# Patient Record
Sex: Female | Born: 1985 | Race: Black or African American | Hispanic: No | Marital: Single | Smoking: Former smoker
Health system: Southern US, Community
[De-identification: ages and names within clinical notes are randomized; demographics above are authoritative.]

## PROBLEM LIST (undated history)

## (undated) ENCOUNTER — Emergency Department (HOSPITAL_COMMUNITY): Admission: EM | Discharge status: Absent without leave | Payer: No Typology Code available for payment source

## (undated) DIAGNOSIS — I10 Essential (primary) hypertension: Secondary | ICD-10-CM

## (undated) DIAGNOSIS — J45909 Unspecified asthma, uncomplicated: Secondary | ICD-10-CM

---
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
## 2005-02-03 ENCOUNTER — Inpatient Hospital Stay (HOSPITAL_COMMUNITY): Admission: AD | Admit: 2005-02-03 | Discharge: 2005-02-03 | Payer: Self-pay | Admitting: Obstetrics

## 2005-02-07 ENCOUNTER — Observation Stay (HOSPITAL_COMMUNITY): Admission: AD | Admit: 2005-02-07 | Discharge: 2005-02-07 | Payer: Self-pay | Admitting: Obstetrics & Gynecology

## 2005-02-13 ENCOUNTER — Inpatient Hospital Stay (HOSPITAL_COMMUNITY): Admission: AD | Admit: 2005-02-13 | Discharge: 2005-02-13 | Payer: Self-pay | Admitting: Obstetrics & Gynecology

## 2005-02-17 ENCOUNTER — Inpatient Hospital Stay (HOSPITAL_COMMUNITY): Admission: AD | Admit: 2005-02-17 | Discharge: 2005-02-19 | Payer: Self-pay | Admitting: Obstetrics

## 2005-03-29 ENCOUNTER — Ambulatory Visit: Payer: Self-pay | Admitting: Family Medicine

## 2005-06-12 ENCOUNTER — Encounter (INDEPENDENT_AMBULATORY_CARE_PROVIDER_SITE_OTHER): Payer: Self-pay | Admitting: *Deleted

## 2005-06-25 ENCOUNTER — Inpatient Hospital Stay (HOSPITAL_COMMUNITY): Admission: EM | Admit: 2005-06-25 | Discharge: 2005-06-29 | Payer: Self-pay | Admitting: Emergency Medicine

## 2005-08-30 ENCOUNTER — Ambulatory Visit: Payer: Self-pay | Admitting: Family Medicine

## 2005-10-12 ENCOUNTER — Ambulatory Visit: Payer: Self-pay | Admitting: Family Medicine

## 2005-11-15 ENCOUNTER — Ambulatory Visit: Payer: Self-pay | Admitting: Family Medicine

## 2005-12-12 ENCOUNTER — Ambulatory Visit: Payer: Self-pay | Admitting: Family Medicine

## 2006-02-06 ENCOUNTER — Ambulatory Visit: Payer: Self-pay | Admitting: Family Medicine

## 2006-04-19 ENCOUNTER — Ambulatory Visit: Payer: Self-pay | Admitting: Sports Medicine

## 2006-08-09 DIAGNOSIS — J45909 Unspecified asthma, uncomplicated: Secondary | ICD-10-CM | POA: Insufficient documentation

## 2006-08-09 DIAGNOSIS — J309 Allergic rhinitis, unspecified: Secondary | ICD-10-CM | POA: Insufficient documentation

## 2006-08-10 ENCOUNTER — Encounter (INDEPENDENT_AMBULATORY_CARE_PROVIDER_SITE_OTHER): Payer: Self-pay | Admitting: *Deleted

## 2006-08-26 ENCOUNTER — Emergency Department (HOSPITAL_COMMUNITY): Admission: EM | Admit: 2006-08-26 | Discharge: 2006-08-26 | Payer: Self-pay | Admitting: Family Medicine

## 2006-10-08 ENCOUNTER — Encounter (INDEPENDENT_AMBULATORY_CARE_PROVIDER_SITE_OTHER): Payer: Self-pay | Admitting: Family Medicine

## 2006-10-10 ENCOUNTER — Emergency Department (HOSPITAL_COMMUNITY): Admission: EM | Admit: 2006-10-10 | Discharge: 2006-10-10 | Payer: Self-pay | Admitting: Emergency Medicine

## 2006-10-16 ENCOUNTER — Telehealth: Payer: Self-pay | Admitting: *Deleted

## 2006-10-17 ENCOUNTER — Telehealth: Payer: Self-pay | Admitting: *Deleted

## 2006-11-13 ENCOUNTER — Telehealth (INDEPENDENT_AMBULATORY_CARE_PROVIDER_SITE_OTHER): Payer: Self-pay | Admitting: Family Medicine

## 2006-12-11 ENCOUNTER — Inpatient Hospital Stay (HOSPITAL_COMMUNITY): Admission: AD | Admit: 2006-12-11 | Discharge: 2006-12-11 | Payer: Self-pay | Admitting: Family Medicine

## 2007-02-08 ENCOUNTER — Encounter: Payer: Self-pay | Admitting: *Deleted

## 2007-02-18 ENCOUNTER — Ambulatory Visit: Payer: Self-pay | Admitting: Sports Medicine

## 2007-02-18 DIAGNOSIS — L0293 Carbuncle, unspecified: Secondary | ICD-10-CM

## 2007-02-18 LAB — CONVERTED CEMR LAB: Beta hcg, urine, semiquantitative: NEGATIVE

## 2007-04-09 ENCOUNTER — Ambulatory Visit: Payer: Self-pay | Admitting: Family Medicine

## 2007-04-09 ENCOUNTER — Other Ambulatory Visit: Admission: RE | Admit: 2007-04-09 | Discharge: 2007-04-09 | Payer: Self-pay | Admitting: Family Medicine

## 2007-04-09 LAB — CONVERTED CEMR LAB: Pap Smear: NORMAL

## 2007-04-11 ENCOUNTER — Encounter (INDEPENDENT_AMBULATORY_CARE_PROVIDER_SITE_OTHER): Payer: Self-pay | Admitting: Family Medicine

## 2007-04-21 ENCOUNTER — Emergency Department (HOSPITAL_COMMUNITY): Admission: EM | Admit: 2007-04-21 | Discharge: 2007-04-21 | Payer: Self-pay | Admitting: Emergency Medicine

## 2007-04-24 ENCOUNTER — Telehealth: Payer: Self-pay | Admitting: Family Medicine

## 2007-05-13 ENCOUNTER — Emergency Department (HOSPITAL_COMMUNITY): Admission: EM | Admit: 2007-05-13 | Discharge: 2007-05-13 | Payer: Self-pay | Admitting: Emergency Medicine

## 2007-05-14 ENCOUNTER — Ambulatory Visit: Payer: Self-pay | Admitting: Family Medicine

## 2007-05-14 DIAGNOSIS — I10 Essential (primary) hypertension: Secondary | ICD-10-CM

## 2007-05-14 LAB — CONVERTED CEMR LAB
Blood in Urine, dipstick: NEGATIVE
Glucose, Urine, Semiquant: NEGATIVE
Ketones, urine, test strip: NEGATIVE
Protein, U semiquant: 30
Specific Gravity, Urine: >=1.03
pH: 6

## 2007-06-03 ENCOUNTER — Telehealth: Payer: Self-pay | Admitting: *Deleted

## 2007-06-27 ENCOUNTER — Telehealth: Payer: Self-pay | Admitting: *Deleted

## 2007-07-01 ENCOUNTER — Emergency Department (HOSPITAL_COMMUNITY): Admission: EM | Admit: 2007-07-01 | Discharge: 2007-07-01 | Payer: Self-pay | Admitting: Family Medicine

## 2007-07-01 ENCOUNTER — Telehealth (INDEPENDENT_AMBULATORY_CARE_PROVIDER_SITE_OTHER): Payer: Self-pay | Admitting: *Deleted

## 2007-07-23 ENCOUNTER — Telehealth (INDEPENDENT_AMBULATORY_CARE_PROVIDER_SITE_OTHER): Payer: Self-pay | Admitting: Family Medicine

## 2007-07-23 ENCOUNTER — Emergency Department (HOSPITAL_COMMUNITY): Admission: EM | Admit: 2007-07-23 | Discharge: 2007-07-23 | Payer: Self-pay | Admitting: Family Medicine

## 2007-07-31 ENCOUNTER — Telehealth: Payer: Self-pay | Admitting: *Deleted

## 2007-09-18 ENCOUNTER — Encounter: Payer: Self-pay | Admitting: *Deleted

## 2007-09-18 ENCOUNTER — Encounter (INDEPENDENT_AMBULATORY_CARE_PROVIDER_SITE_OTHER): Payer: Self-pay | Admitting: Family Medicine

## 2007-09-18 ENCOUNTER — Ambulatory Visit: Payer: Self-pay | Admitting: Sports Medicine

## 2007-09-18 LAB — CONVERTED CEMR LAB: GC Probe Amp, Urine: NEGATIVE

## 2007-09-19 ENCOUNTER — Encounter (INDEPENDENT_AMBULATORY_CARE_PROVIDER_SITE_OTHER): Payer: Self-pay | Admitting: Family Medicine

## 2007-10-01 ENCOUNTER — Telehealth: Payer: Self-pay | Admitting: *Deleted

## 2007-10-02 ENCOUNTER — Ambulatory Visit: Payer: Self-pay | Admitting: Family Medicine

## 2007-11-12 ENCOUNTER — Telehealth: Payer: Self-pay | Admitting: *Deleted

## 2007-12-01 ENCOUNTER — Emergency Department (HOSPITAL_COMMUNITY): Admission: EM | Admit: 2007-12-01 | Discharge: 2007-12-02 | Payer: Self-pay | Admitting: Emergency Medicine

## 2007-12-18 ENCOUNTER — Telehealth: Payer: Self-pay | Admitting: *Deleted

## 2007-12-23 ENCOUNTER — Encounter: Payer: Self-pay | Admitting: Family Medicine

## 2008-01-02 ENCOUNTER — Encounter: Payer: Self-pay | Admitting: Family Medicine

## 2008-02-07 ENCOUNTER — Encounter: Payer: Self-pay | Admitting: Family Medicine

## 2008-02-07 ENCOUNTER — Telehealth: Payer: Self-pay | Admitting: *Deleted

## 2008-02-07 ENCOUNTER — Ambulatory Visit: Payer: Self-pay | Admitting: Family Medicine

## 2008-02-07 LAB — CONVERTED CEMR LAB
Beta hcg, urine, semiquantitative: NEGATIVE
Chlamydia, DNA Probe: NEGATIVE
Whiff Test: NEGATIVE

## 2008-03-14 ENCOUNTER — Emergency Department (HOSPITAL_COMMUNITY): Admission: EM | Admit: 2008-03-14 | Discharge: 2008-03-14 | Payer: Self-pay | Admitting: Emergency Medicine

## 2008-03-19 ENCOUNTER — Telehealth: Payer: Self-pay | Admitting: *Deleted

## 2008-04-02 ENCOUNTER — Ambulatory Visit: Payer: Self-pay | Admitting: Family Medicine

## 2008-04-02 ENCOUNTER — Encounter (INDEPENDENT_AMBULATORY_CARE_PROVIDER_SITE_OTHER): Payer: Self-pay | Admitting: Family Medicine

## 2008-04-02 LAB — CONVERTED CEMR LAB: Chlamydia, DNA Probe: NEGATIVE

## 2008-04-03 ENCOUNTER — Encounter (INDEPENDENT_AMBULATORY_CARE_PROVIDER_SITE_OTHER): Payer: Self-pay | Admitting: Family Medicine

## 2008-06-18 ENCOUNTER — Telehealth: Payer: Self-pay | Admitting: *Deleted

## 2008-06-19 ENCOUNTER — Ambulatory Visit: Payer: Self-pay | Admitting: Family Medicine

## 2008-07-17 ENCOUNTER — Encounter: Payer: Self-pay | Admitting: Family Medicine

## 2008-07-17 ENCOUNTER — Ambulatory Visit: Payer: Self-pay | Admitting: Family Medicine

## 2008-07-17 ENCOUNTER — Telehealth: Payer: Self-pay | Admitting: Family Medicine

## 2008-07-17 ENCOUNTER — Other Ambulatory Visit: Admission: RE | Admit: 2008-07-17 | Discharge: 2008-07-17 | Payer: Self-pay | Admitting: Family Medicine

## 2008-07-17 LAB — CONVERTED CEMR LAB
Chlamydia, DNA Probe: NEGATIVE
GC Probe Amp, Genital: NEGATIVE

## 2008-07-21 ENCOUNTER — Encounter: Payer: Self-pay | Admitting: Family Medicine

## 2008-07-28 ENCOUNTER — Telehealth: Payer: Self-pay | Admitting: Family Medicine

## 2008-08-06 ENCOUNTER — Emergency Department (HOSPITAL_COMMUNITY): Admission: EM | Admit: 2008-08-06 | Discharge: 2008-08-06 | Payer: Self-pay | Admitting: Family Medicine

## 2008-08-06 ENCOUNTER — Telehealth: Payer: Self-pay | Admitting: Family Medicine

## 2008-09-07 ENCOUNTER — Encounter (INDEPENDENT_AMBULATORY_CARE_PROVIDER_SITE_OTHER): Payer: Self-pay | Admitting: *Deleted

## 2008-09-15 ENCOUNTER — Telehealth: Payer: Self-pay | Admitting: Family Medicine

## 2008-09-16 ENCOUNTER — Encounter: Payer: Self-pay | Admitting: Family Medicine

## 2008-09-23 ENCOUNTER — Telehealth: Payer: Self-pay | Admitting: Family Medicine

## 2008-10-08 ENCOUNTER — Telehealth: Payer: Self-pay | Admitting: Family Medicine

## 2008-10-08 ENCOUNTER — Encounter: Payer: Self-pay | Admitting: *Deleted

## 2008-11-21 ENCOUNTER — Emergency Department (HOSPITAL_COMMUNITY): Admission: EM | Admit: 2008-11-21 | Discharge: 2008-11-21 | Payer: Self-pay | Admitting: Emergency Medicine

## 2008-12-03 ENCOUNTER — Telehealth: Payer: Self-pay | Admitting: Family Medicine

## 2009-01-04 ENCOUNTER — Telehealth: Payer: Self-pay | Admitting: Family Medicine

## 2009-01-05 ENCOUNTER — Ambulatory Visit: Payer: Self-pay | Admitting: Family Medicine

## 2009-01-05 ENCOUNTER — Telehealth: Payer: Self-pay | Admitting: Family Medicine

## 2009-01-05 DIAGNOSIS — E669 Obesity, unspecified: Secondary | ICD-10-CM

## 2009-01-05 DIAGNOSIS — O99210 Obesity complicating pregnancy, unspecified trimester: Secondary | ICD-10-CM | POA: Insufficient documentation

## 2009-03-10 ENCOUNTER — Encounter: Payer: Self-pay | Admitting: Family Medicine

## 2009-03-31 ENCOUNTER — Telehealth: Payer: Self-pay | Admitting: Sports Medicine

## 2009-05-22 ENCOUNTER — Emergency Department (HOSPITAL_COMMUNITY): Admission: EM | Admit: 2009-05-22 | Discharge: 2009-05-22 | Payer: Self-pay | Admitting: Family Medicine

## 2009-06-14 ENCOUNTER — Encounter (INDEPENDENT_AMBULATORY_CARE_PROVIDER_SITE_OTHER): Payer: Self-pay | Admitting: *Deleted

## 2009-06-14 DIAGNOSIS — F17200 Nicotine dependence, unspecified, uncomplicated: Secondary | ICD-10-CM

## 2009-08-11 ENCOUNTER — Telehealth: Payer: Self-pay | Admitting: Family Medicine

## 2009-08-31 ENCOUNTER — Ambulatory Visit: Payer: Self-pay | Admitting: Family Medicine

## 2009-08-31 ENCOUNTER — Other Ambulatory Visit: Admission: RE | Admit: 2009-08-31 | Discharge: 2009-08-31 | Payer: Self-pay | Admitting: Family Medicine

## 2009-08-31 ENCOUNTER — Encounter: Payer: Self-pay | Admitting: Family Medicine

## 2009-08-31 LAB — CONVERTED CEMR LAB
Beta hcg, urine, semiquantitative: NEGATIVE
Chlamydia, DNA Probe: NEGATIVE
GC Probe Amp, Genital: NEGATIVE

## 2009-09-01 ENCOUNTER — Encounter: Payer: Self-pay | Admitting: Family Medicine

## 2009-09-10 ENCOUNTER — Encounter: Payer: Self-pay | Admitting: Family Medicine

## 2009-11-29 ENCOUNTER — Emergency Department (HOSPITAL_COMMUNITY): Admission: EM | Admit: 2009-11-29 | Discharge: 2009-11-29 | Payer: Self-pay | Admitting: Family Medicine

## 2009-11-29 ENCOUNTER — Telehealth: Payer: Self-pay | Admitting: Family Medicine

## 2009-12-07 ENCOUNTER — Encounter: Payer: Self-pay | Admitting: Family Medicine

## 2009-12-08 ENCOUNTER — Telehealth: Payer: Self-pay | Admitting: Family Medicine

## 2009-12-09 ENCOUNTER — Ambulatory Visit: Payer: Self-pay | Admitting: Family Medicine

## 2009-12-09 ENCOUNTER — Encounter: Payer: Self-pay | Admitting: Family Medicine

## 2009-12-10 LAB — CONVERTED CEMR LAB: GC Probe Amp, Genital: NEGATIVE

## 2009-12-16 ENCOUNTER — Encounter: Payer: Self-pay | Admitting: Family Medicine

## 2009-12-16 ENCOUNTER — Ambulatory Visit: Payer: Self-pay | Admitting: Family Medicine

## 2009-12-18 ENCOUNTER — Telehealth: Payer: Self-pay | Admitting: Family Medicine

## 2010-01-19 ENCOUNTER — Telehealth: Payer: Self-pay | Admitting: Family Medicine

## 2010-01-24 ENCOUNTER — Encounter: Payer: Self-pay | Admitting: Family Medicine

## 2010-02-08 ENCOUNTER — Telehealth: Payer: Self-pay | Admitting: Family Medicine

## 2010-02-09 ENCOUNTER — Ambulatory Visit: Payer: Self-pay | Admitting: Family Medicine

## 2010-02-09 LAB — CONVERTED CEMR LAB: Hemoglobin: 12.5 g/dL

## 2010-03-14 ENCOUNTER — Encounter: Payer: Self-pay | Admitting: Family Medicine

## 2010-03-29 ENCOUNTER — Emergency Department (HOSPITAL_COMMUNITY): Admission: EM | Admit: 2010-03-29 | Discharge: 2010-03-29 | Payer: Self-pay | Admitting: Emergency Medicine

## 2010-03-29 ENCOUNTER — Telehealth: Payer: Self-pay | Admitting: Family Medicine

## 2010-03-29 ENCOUNTER — Telehealth (INDEPENDENT_AMBULATORY_CARE_PROVIDER_SITE_OTHER): Payer: Self-pay | Admitting: *Deleted

## 2010-05-03 ENCOUNTER — Emergency Department (HOSPITAL_COMMUNITY)
Admission: EM | Admit: 2010-05-03 | Discharge: 2010-05-03 | Payer: Self-pay | Source: Home / Self Care | Admitting: Emergency Medicine

## 2010-05-27 ENCOUNTER — Ambulatory Visit: Payer: Self-pay

## 2010-06-07 ENCOUNTER — Encounter: Payer: Self-pay | Admitting: Family Medicine

## 2010-07-13 NOTE — Progress Notes (Signed)
  Phone Note Call from Patient    Follow-up for Phone Call        spoke with patient regarding call to on call dr.  Rock Nephew was offered an appt at 3:45 but refused. Said she was in pain and would go to the hospital.  Explained that if she went to urgent care we could not authorize visit if she refused an appt with [place].  She said she didn't care, was not going to sit until 3:45 to be seen.  Said she would go to hospital instead.  Pt hung up. Follow-up by: Abundio Miu,  March 29, 2010 1:45 PM

## 2010-07-13 NOTE — Progress Notes (Signed)
   sharp pain on the right side. Has had some pressure and urgency for the last 2 days. Is on birth control. Thinks this is a bladder or kidney infection. She is just a few hours away from the clinic being open. I told her to take some Tylenol or Motrin and drink plenty of water and come into the clinc in the morning if she is still having some pain. Says she will call back/come in if still in pain. Jamie Brookes MD  March 29, 2010 4:35 AM

## 2010-07-13 NOTE — Letter (Signed)
Summary: Generic Letter  [facility]  [address]   [place], [place] [address]   Phone: [phone]  Fax: [phone]    09/10/2009  Terri [name] [address] [place], [place]  [address]  Dear Ms. Scobey,   Recently you submitted a request for Singulair refill, this required a prior authorization. You have documented Asthma and Allergic Rhinitis. As there is no recollection in our system of your use of a nasal spray for your rhinitis your Singulair was denied.  I have sent a new prescription for Flonase to try and we will review your symptoms after a trial of 6-8 weeks.  If you have recollection of trying a nasal spray for your allergies please let me know and I will document this as best as possible.       Sincerely,   Milinda Antis MD  Appended Document: Generic Letter mailed.

## 2010-07-13 NOTE — Miscellaneous (Signed)
Summary: prior auth  Clinical Lists Changes prior auth for singulair to pcp to complete & sign.Golden Circle RN  August 31, 2009 4:07 PM  Done  Appended Document: prior auth he does not qualify for singulair per medicaid. their letter placed in md chart box. "must try nasal spray in addition to antihistamine"

## 2010-07-13 NOTE — Miscellaneous (Signed)
Summary: long period  Clinical Lists Changes concerned that she has had a wk long period. states they are irregular & never longer than 4 days. had implanon put in 12/16/09. told her I will ask md & call her tomorrow. she does not want an appt if it is just her body getting used to it.Golden Circle RN  January 24, 2010 5:01 PM    Her cycles may be irregular the first few months on Implanon. If she is not soaking through a pad every hour and pain is not severe then not to worry. If anything longer than 10 days please come in [facility] MD  January 25, 2010 1:59 PM   tried to reach pt. phone has been d/c'd.Golden Circle RN  January 25, 2010 4:14 PM  Appended Document: long period she called back. her new # is [phone]. told her what md wrote. she does not want an appt at this time. will give it a few more months

## 2010-07-13 NOTE — Miscellaneous (Signed)
   Clinical Lists Changes  Problems: Changed problem from ASTHMA, EXERCISE INDUCED (ICD-493.81) to ASTHMA, INTERMITTENT (ICD-493.90) 

## 2010-07-13 NOTE — Assessment & Plan Note (Signed)
Summary: vag bleeding/Terri [name]/Terri [name]   Vital Signs:  Patient profile:   25 year old female Height:      68 inches Weight:      254 pounds BMI:     38.76 Temp:     97.8 degrees F oral Pulse rate:   90 / minute BP sitting:   149 / 88  (left arm) Cuff size:   large  Vitals Entered By: Tessie Fass CMA (December 09, 2009 8:34 AM) CC: vaginal bleeding, abdominal pain x 2 days Is Patient Diabetic? No Pain Assessment Patient in pain? yes     Location: abdomen Intensity: 8   Primary Care Provider:  Milinda Antis MD  CC:  vaginal bleeding and abdominal pain x 2 days.  History of Present Illness: Took Plan B 6/7and needed to repeat in 2 weeks 6/21.  Also got medication for Bactrim.  Only took 1 dose of Bactrim and had stomach pain, so did not continue the Bactrim.   LMP started 6/12 - 6/17 and then noted bleeding 6/ 28.  Also noted that tissue was in the bleeding. Never as heavy as 1 pad per hour, and now with scant bleeding.  Also with sharp pains in stomach (usually no cramps with menses), but pain similar to labor pains.No meds for this, but boyfriend offered to call EMS for the pain.. Feels she shouldn't eat anything because it might make the pain worse. Smoked 3 cigs this week.   Wants Implanon for contraception.  Has appt next week with Dr. Jeanice Lim for insertion.   Sexually active.  No concerns about STIs.  No vaginal discharge.  Habits & Providers  Alcohol-Tobacco-Diet     Tobacco Status: current  Current Medications (verified): 1)  Proventil Hfa 108 (90 Base) Mcg/act  Aers (Albuterol Sulfate) .... 2 Puffs in Am and With Shortness of Breath. 2)  Bactrim Ds 800-160 Mg  Tabs (Sulfamethoxazole-Trimethoprim) .... 2 Tabs By Mouth Two Times A Day X 10 Days 3)  Allegra 180 Mg Tabs (Fexofenadine Hcl) .[name].. 1 By Mouth Daily 4)  Singulair 10 Mg Tabs (Montelukast Sodium) .[name].. 1 By Mouth Daily 5)  Plan B One-Step 1.5 Mg Tabs (Levonorgestrel) .[name].. 1 By Mouth X 1 Dose, Must Use Within 72 Hours of  Sexual Intercourse 6)  Lotrisone 1-0.05 % Crea (Clotrimazole-Betamethasone) .... Apply To Affected Area On Breast Twice A Day For 7 Days 7)  Flonase 50 Mcg/act Susp (Fluticasone Propionate) .... 2  Spray Each Nostril Daily  Allergies (verified): No Known Drug Allergies  Social History: Moved to [place] from [place] in 2003. Lives with mom. Unemployed.FOB incarcerated- released 2009  Quit smoking 2011, but started back with 3 cigs/week June, 2011. Contemplating to stop smoking.  Son 60 years old.Smoking Status:  current  Review of Systems       see HPI  Physical Exam  General:  Obese,in no acute distress; alert,appropriate and cooperative throughout examination.  Vitals noted. Genitalia:  Nl ext female genitalia.  Nl well rugated vagina. NO cervical lesions. No bleeding noted, except scant blood on swab for gc/cz.  No discharge. + TTP on Bimanual of uterus and adnexa, L >R. No CMT Skin:  No active abcesses noted on exam.  +numerous well healed, scarred lesions on medial thighs.   Impression & Recommendations:  Problem # 1:  METRORRHAGIA (ICD-626.6)  Likely from use of Plan B twice recently.  Explained to pt this may throw her cycles off.  Upreg neg today.  Bleeding not severe.  F/u with Dr.  Fenwick for Implanon insertion.  Orders: [facility]- Est Level  3 ([address])  Problem # 2:  TOBACCO USER (ICD-305.1)  Pt should quit.  Orders: [facility]- Est Level  3 ([address])  Problem # 3:  CONTRACEPTIVE MANAGEMENT (ICD-V25.09)  Pt counseled on Implanon, specifically about bleeding with the device.  Will need more extensive counseling re: risks and benefits and risks of insertion.  Pt handout given.  Follow up with Dr. Jeanice Lim.  Orders: [facility]- Est Level  3 ([address]11)  Complete Medication List: 1)  Proventil Hfa 108 (90 Base) Mcg/act Aers (Albuterol sulfate) .... 2 puffs in am and with shortness of breath. 2)  Bactrim Ds 800-160 Mg Tabs (Sulfamethoxazole-trimethoprim) .... 2 tabs by mouth two times a day x 10  days 3)  Allegra 180 Mg Tabs (Fexofenadine hcl) .[name].. 1 by mouth daily 4)  Singulair 10 Mg Tabs (Montelukast sodium) .[name].. 1 by mouth daily 5)  Plan B One-step 1.5 Mg Tabs (Levonorgestrel) .[name].. 1 by mouth x 1 dose, must use within 72 hours of sexual intercourse 6)  Lotrisone 1-0.05 % Crea (Clotrimazole-betamethasone) .... Apply to affected area on breast twice a day for 7 days 7)  Flonase 50 Mcg/act Susp (Fluticasone propionate) .... 2  spray each nostril daily 8)  Naproxen 500 Mg Tabs (Naproxen) .... Take 1 every 12 hours as needed pain  Other Orders: U Preg-[facility] (91478) GC/Chlamydia-[facility] (87591/87491)  Patient Instructions: 1)  Please keep your appt on July 7th.  Please use condoms or don't have sex until then.   2)  I think the bleeding is normal from the medicines you have been taking.   3)  You can try the naproxen for pain.   4)  Let us know if you feel worse. Prescriptions: NAPROXEN 500 MG TABS (NAPROXEN) Take 1 every 12 hours as needed pain  #30 x 1   Entered by:   Sarah Swaziland MD   Authorized by:   Milinda Antis MD   Signed by:   Sarah Swaziland MD on 12/09/2009   Method used:   [facility]        [facility] [address] [phone]* (retail)       [address]       [place], [place]  [address]       Ph: [phone] or [phone]       Fax: [phone]   RxID:   [id number]   Laboratory Results   Urine Tests  Date/Time Received: December 09, 2009 8:53 AM  Date/Time Reported: December 09, 2009 9:04 AM     Urine HCG: negative Comments: ...............test performed by......Marland KitchenBonnie A. Swaziland, MLS (ASCP)cm

## 2010-07-13 NOTE — Progress Notes (Signed)
Summary: phn msg  Phone Note Call from Patient Call back at Home Phone [phone]   Caller: Patient Summary of Call: needs to talk to nurse about bleeding Initial call taken by: De Nurse,  December 08, 2009 10:56 AM  Follow-up for Phone Call        wants to be seen for her vaginal bleeding Follow-up by: Golden Circle RN,  December 08, 2009 11:05 AM     Appended Document: phn msg Put pt in work in for AM - I am cross cover   Appended Document: phn msg I put her in for 8:30. she already had been given an appt for 3:30 today but she has not yet even left her house to come here. states "I have 15 minutes,right?" told her that we are busy & that will throw the md schedule off. as she has not left yet, she agreed to am appt

## 2010-07-13 NOTE — Miscellaneous (Signed)
Summary: Procedure consent  Procedure consent   Imported By: De Nurse 12/21/2009 16:34:32  _____________________________________________________________________  External Attachment:    Type:   Image     Comment:   External Document

## 2010-07-13 NOTE — Assessment & Plan Note (Signed)
Summary: continues to bleed/[place]   Vital Signs:  Patient profile:   25 year old female Weight:      252.2 pounds Temp:     98.8 degrees F Pulse rate:   90 / minute BP sitting:   132 / 88  Vitals Entered By: Milinda Antis MD (February 09, 2010 10:53 AM) CC: cont bleeding/boils Is Patient Diabetic? No Pain Assessment Patient in pain? no        Primary Care [place]:  Milinda Antis MD  CC:  cont bleeding/boils.  History of Present Illness:   Has pains in right side, has had heavy to intermittant spotting for 3 weeks since Implanon placed Sexually active last week when cycle stopped, no vaginal discharge  Cramps in stomach with sexual activity , cramps with bleeding as well occur in lower quandrants of abd, right side > left . +constpation over past week. No dysuria. Using 4 tampons a day but they are not completed soaked through,she prefers to change often  Pt also stated she had an episode one morning where her entire left leg was numb, the feeling returned within 1 hour, unsure if leg was "asleeo", no specific tied to bleeding per above  would prefer to keep implanon at this time  Needs refill on antibiotics for boils  Habits & Providers  Alcohol-Tobacco-Diet     Tobacco Status: current     Cigarette Packs/Day: <0.25  Current Medications (verified): 1)  Proventil Hfa 108 (90 Base) Mcg/act  Aers (Albuterol Sulfate) .... 2 Puffs in Am and With Shortness of Breath. 2)  Allegra 180 Mg Tabs (Fexofenadine Hcl) .[name].. 1 By Mouth Daily 3)  Singulair 10 Mg Tabs (Montelukast Sodium) .[name].. 1 By Mouth Daily 4)  Flonase 50 Mcg/act Susp (Fluticasone Propionate) .... 2  Spray Each Nostril Daily 5)  Implanon 68 Mg Impl (Etonogestrel) .... Left Arm Inserted 12/16/09 6)  Bactrim Ds 800-160 Mg Tabs (Sulfamethoxazole-Trimethoprim) .... 2 Tabs By Mouth Two Times A Day X 10 Days For Boils 7)  Sprintec 28 0.25-35 Mg-Mcg Tabs (Norgestimate-Eth Estradiol) .... Take 1 By Mouth Two Times A  Day  Until Bleeding Stopped, Then 1 By Mouth Daily  Dispense- 2 Packs  Allergies (verified): No Known Drug Allergies  Social History: Smoking Status:  current Packs/Day:  <0.25  Physical Exam  General:  Obese,in no acute distress; alert,appropriate and cooperative throughout examination.  Vitals noted. Mouth:  teeth missing.   Genitalia:  Nl ext female genitalia.  Nl well rugated vagina. NO cervical lesions. no lesions on vagina, bleeding at os, no clots visualized Neurologic:  normal gait Skin:    +numerous well healed, scarred lesions on medial thighs. small 1cm boil on right inner thigh   Impression & Recommendations:  Problem # 1:  METRORRHAGIA (ICD-626.6) Assessment New  Secondary to hormonal flutuations with progesteron implant. Upreg neg, Hb stable. Will give estrogen OCP to stop bleeding, reiterated to pt this may reoccur until her hormones settle out.  Orders: U Preg-[facility] (81025) Hemoglobin-[facility] (85018) [facility]- Est Level  3 (04540)  Problem # 2:  BOILS, RECURRENT (ICD-680.9) Assessment: New  refilled Bactrim  Orders: [facility]- Est Level  3 (99213)  Complete Medication List: 1)  Proventil Hfa 108 (90 Base) Mcg/act Aers (Albuterol sulfate) .... 2 puffs in am and with shortness of breath. 2)  Allegra 180 Mg Tabs (Fexofenadine hcl) .[name].. 1 by mouth daily 3)  Singulair 10 Mg Tabs (Montelukast sodium) .[name].. 1 by mouth daily 4)  Flonase 50 Mcg/act Susp (Fluticasone propionate) .Marland KitchenMarland KitchenMarland Kitchen  2  spray each nostril daily 5)  Implanon 68 Mg Impl (Etonogestrel) .... Left arm inserted 12/16/09 6)  Bactrim Ds 800-160 Mg Tabs (Sulfamethoxazole-trimethoprim) .... 2 tabs by mouth two times a day x 10 days for boils 7)  Sprintec 28 0.25-35 Mg-mcg Tabs (Norgestimate-eth estradiol) .... Take 1 by mouth two times a day  until bleeding stopped, then 1 by mouth daily  dispense- 2 packs  Patient Instructions: 1)  Take the birth control pill twice a day until bleeding stops, then finish that pack by taking 1  tablet daily 2)  If no change in the bleeding please come back to be seen 3)  I have refilled your antibiotic Prescriptions: SPRINTEC 28 0.25-35 MG-MCG TABS (NORGESTIMATE-ETH ESTRADIOL) Take 1 by mouth two times a day  until bleeding stopped, then 1 by mouth daily  Dispense- 2 packs  #2 x 0   Entered and Authorized by:   Milinda Antis MD   Signed by:   Milinda Antis MD on 02/09/2010   Method used:   [facility]        [facility] [address] [phone]* (retail)       [address]       [place], [place]  [address]       Ph: [phone] or [phone]       Fax: [phone]   RxID:   [id number] BACTRIM DS 800-160 MG TABS (SULFAMETHOXAZOLE-TRIMETHOPRIM) 2 tabs by mouth two times a day x 10 days for boils  #20 x 0   Entered and Authorized by:   Milinda Antis MD   Signed by:   Milinda Antis MD on 02/09/2010   Method used:   [facility]ronically to        [facility] [address] [phone]* (retail)       [address]       [place], [place]  [address]       Ph: [phone] or [phone]       Fax: [phone]   RxID:   [id number]   Laboratory Results   Urine Tests  Date/Time Received: February 09, 2010 10:34 AM  Date/Time Reported: February 09, 2010 10:40 AM     Urine HCG: negative Comments: ...........test performed by...........Marland KitchenTerese Door, CMA   Blood Tests   Date/Time Received: February 09, 2010 10:34 AM  Date/Time Reported: February 09, 2010 10:40 AM     CBC   HGB:  12.5 g/dL   (Normal Range: 42.7-06.2 in Males, 12.0-15.0 in Females) Comments: ...........test performed by............Marland KitchenBAJordan, MT(ASCP)10:40 AM entered by Terese Door, CMA

## 2010-07-13 NOTE — Progress Notes (Signed)
  Pt has just left the Urgent Care and was told to get the Plan B Rx from her PCP. However, she was sent to the UC today because she could not get to our clinic before 5 pm. She has had this Rx before. Golden Circle discussed use of condoms and spermacides with her earlier today. She also has recurrent boils on her legs and was seen for that at the Urgent Care. She will turn in the hard copy Rx for Batrim to the pharmacy but I sent in a refill for it as well.    Prescriptions: BACTRIM DS 800-160 MG  TABS (SULFAMETHOXAZOLE-TRIMETHOPRIM) 2 tabs by mouth two times a day x 10 days  #30 x 0   Entered and Authorized by:   Jamie Brookes MD   Signed by:   Jamie Brookes MD on 11/29/2009   Method used:   Electronically to        [facility] [address] [phone]* (retail)       [address]       [place], [place]  [address]       Ph: [phone] or [phone]       Fax: [phone]   RxID:   [id number] PLAN B ONE-STEP 1.5 MG TABS (LEVONORGESTREL) 1 by mouth x 1 dose, must use within 72 hours of sexual intercourse  #1 x 0   Entered and Authorized by:   Jamie Brookes MD   Signed by:   Jamie Brookes MD on 11/29/2009   Method used:   Electronically to        [facility] [address] [phone]* (retail)       [address]       [place], [place]  [address]       Ph: [phone] or [phone]       Fax: [phone]   RxID:   [id number]

## 2010-07-13 NOTE — Letter (Signed)
Summary: Normal Pap-RTC 1 year  All     ,     Phone:   Fax:     September 01, 2009     [facility] [address] [place], [place] [address]    Dear Ms. Fickett,  The results of your recent pap smear were: Normal, no evidence of Malignancy . Your STD check was also negative.   We will see you in 1 year for your annual exam.  Please contact our office if you have any questions.  Sincerely,   Milinda Antis MD   Appended Document: Normal Pap-RTC 1 year mailed.

## 2010-07-13 NOTE — Progress Notes (Signed)
Summary: triage  Phone Note Call from Patient Call back at [phone]   Caller: Patient Summary of Call: Pt missed appt today with dr. Jeanice Lim now asking to be worked in.  Saying she is having stomach cramps. Initial call taken by: Clydell Hakim,  August 11, 2009 11:11 AM  Follow-up for Phone Call        states she had double booked herself with 2 appts at same time. re-scheduled for 3/22 at 8:30 Follow-up by: Golden Circle RN,  August 11, 2009 11:15 AM

## 2010-07-13 NOTE — Miscellaneous (Signed)
Summary: Singulair denied  Clinical Lists Changes  Medications: Added new medication of FLONASE 50 MCG/ACT SUSP (FLUTICASONE PROPIONATE) 2  spray each nostril daily - Signed Rx of FLONASE 50 MCG/ACT SUSP (FLUTICASONE PROPIONATE) 2  spray each nostril daily;  #1 x 3;  Signed;  Entered by: Milinda Antis MD;  Authorized by: Milinda Antis MD;  Method used: [facility] [address] [phone]*, [address] [place], [place], [place]  [address], Ph: [phone] or [phone], Fax: [phone] Pt does not meet requirment as she has not tried an nasal steroid for her condition regarding allergic rhinitis. I have sent a prescription for Flonase to try. Letter sent to patient   Prescriptions: FLONASE 50 MCG/ACT SUSP (FLUTICASONE PROPIONATE) 2  spray each nostril daily  #1 x 3   Entered and Authorized by:   Milinda Antis MD   Signed by:   Milinda Antis MD on 09/10/2009   Method used:   Electronically to        [facility] [address] [phone]* (retail)       [address]       [place], [place]  [address]       Ph: [phone] or [phone]       Fax: [phone]   RxID:   [id number]

## 2010-07-13 NOTE — Progress Notes (Signed)
  Phone Note Call from Patient   Summary of Call: called recieved via after hours line.  Pt states that she has had nausea since yesterday and wants to make sure that "everything is ok" and that this isn't due to the recent placement of implanon.  she denies vomiting, diarrhea, or fever.  Pt states that are where implanon was placed this week has some minimal bruising but no redness and is not hot to touch.  We discussed that women have a variety of side effects to birth control and this may be a possible cause, but that there is also many other things that could cause nausea as well.  One of the most common being a viral infections.   Pt denies sick contact.  Informed pt that clinic is now closed.  Encouraged her to go to UC or to ER if needed over the weekend to be evaluated.  Pt also aware that she can call the emergency line if she has any further questions. Ellin Mayhew MD  December 18, 2009 6:36 AM

## 2010-07-13 NOTE — Progress Notes (Signed)
Summary: phnmsg  Phone Note Call from Patient Call back at [phone]   Caller: Patient Summary of Call: pt is still bleeding and is worried about it Initial call taken by: De Nurse,  February 08, 2010 11:04 AM  Follow-up for Phone Call        this number does not work "is not valid"  will wait for pt to call back. will need appt per earlier ov notes Follow-up by: Golden Circle RN,  February 08, 2010 11:10 AM  Additional Follow-up for Phone Call Additional follow up Details #1::        pt returned call- number changed Additional Follow-up by: De Nurse,  February 08, 2010 2:34 PM    Additional Follow-up for Phone Call Additional follow up Details #2::    appt with pcp tomorrow at 10:15 Follow-up by: Golden Circle RN,  February 08, 2010 2:45 PM

## 2010-07-13 NOTE — Assessment & Plan Note (Signed)
Summary: cpp/[place]   Vital Signs:  Patient profile:   25 year old female Weight:      262.4 pounds Temp:     98.5 degrees F oral Pulse rate:   88 / minute BP sitting:   138 / 96  (right arm)  Vitals Entered By: Arlyss Repress CMA, (August 31, 2009 9:58 AM) CC: cpp Is Patient Diabetic? No Pain Assessment Patient in pain? yes     Location: abdomen Intensity: 5 Onset of pain  x3 days   Primary Care Provider:  Milinda Antis MD  CC:  cpp.  History of Present Illness:  Pt here for CPP with multiple complaints  1. PAP smear- LMP was at end of Feb date unknown had small amount of spotting for 3 days, period normally irregular but heavier, concerned she is pregnant. Had unprotected sex with partner multiple times including last pm, wants plan B ROS- admits to abdominal cramping on and off for past 4 days,no dysuria, no discharge, no constipation, pain is non radiating, no fever, no vaginal bleeding, denies N/V  2. Boils- feels like her boils are returning, more in vaginal region and legs,. unsure if one is on breast  3. Contraception- wants implanon afraid of weight gain, does has many reasons for not wanting other such as patch, ring, IUD. Called health dept but did not want to have the required physical exam first.  4. Allergies/Asthma- needs meds refilled  QUit smoking 3 months  Habits & Providers  Alcohol-Tobacco-Diet     Tobacco Status: quit  Current Medications (verified): 1)  Proventil Hfa 108 (90 Base) Mcg/act  Aers (Albuterol Sulfate) .... 2 Puffs in Am and With Shortness of Breath. 2)  Bactrim Ds 800-160 Mg  Tabs (Sulfamethoxazole-Trimethoprim) .... 2 Tabs By Mouth Two Times A Day X 10 Days 3)  Allegra 180 Mg Tabs (Fexofenadine Hcl) .[name].. 1 By Mouth Daily 4)  Singulair 10 Mg Tabs (Montelukast Sodium) .[name].. 1 By Mouth Daily 5)  Plan B One-Step 1.5 Mg Tabs (Levonorgestrel) .[name].. 1 By Mouth X 1 Dose, Must Use Within 72 Hours of Sexual Intercourse 6)  Lotrisone 1-0.05 % Crea  (Clotrimazole-Betamethasone) .... Apply To Affected Area On Breast Twice A Day For 7 Days  Allergies (verified): No Known Drug Allergies  Past History:  Past Medical History: Last updated: 04/09/2007 G1P1,  MRSA  abscess, R thigh  (06/2005)  Family History: Last updated: 05/14/2007 HTN- mom AD - great grandma irregular heart beat in grandmother  Social History: Smoking Status:  quit  Review of Systems       Per HPI  Physical Exam  General:  Well-developed,obese ,in no acute distress; alert,appropriate and cooperative throughout examination Vital signs noted  Breasts:  No mass, nodules, thickening, tenderness, bulging, retraction, inflamation, nipple discharge  Left breast at 3 o clock position small, rasied, rough erythemaout region with dry skin healed lesions like prior boils, breast non tender Lungs:  Normal respiratory effort, chest expands symmetrically. Lungs are clear to auscultation, no crackles or wheezes. Heart:  RRR Abdomen:  soft, normal bowel sounds, no distention, no masses, no guarding, and no rigidity.  Mild TTP in lower quadrants, no suprapubic tenderness Genitalia:  normal introitus, no vaginal discharge, mucosa pink and moist, no vaginal or cervical lesions, no vaginal atrophy, and no friaility or hemorrhage.  Unable to palpate ovaries secodnary to body habitus no CMT PAP smear done  Skin:  multiple boils in different healing stages in groin and inner thigh, no pus expressed  Psych:  Oriented X3, memory intact for recent and remote, normally interactive, good eye contact, and not depressed appearing.     Impression & Recommendations:  Problem # 1:  Preventive Health Care (ICD-V70.0) Assessment New Red flags include weight and elevated bp. Pt has monogomous relationship and one son already however is very immature  regarding contraception and multople reasons for not getting her implanon since she does not want any other BC. PAP done, STD check  done  Problem # 2:  HYPERTENSION, BENIGN ESSENTIAL (ICD-401.1) Assessment: Unchanged  Enforced weight loss actitivy and diet, pt was prescribed meds a year ago, but did not take them. Gave her an eye opener today. RTC 3 months, pt to work on lifestyle changes goal  < 140/90. Would start HCTZ 12.5mg    Orders: [facility] - Est  18-39 yrs (96295)  Problem # 3:  SEXUALLY TRANSMITTED DISEASE, EXPOSURE TO (ICD-V01.6) Assessment: New  Orders: GC/Chlamydia-[facility] ([id number]) [facility] - Est  18-39 yrs ([id number])  Problem # 4:  AMENORRHEA (ICD-626.0) Assessment: New  Upreg neg, given plan B , Pt to go to health dept for Implanon Her updated medication list for this problem includes:    Plan B One-step 1.5 Mg Tabs (Levonorgestrel) .[name]... 1 by mouth x 1 dose, must use within 72 hours of sexual intercourse  Orders: U Preg-[facility] (81025) [facility] - Est  18-39 yrs ([id number])  Problem # 5:  BOILS, RECURRENT (ICD-680.9) Assessment: Unchanged  Refilled bactrim Unsure of lesion on breast will treat with Lotrimin does not appear to be a boil and only one spot.   Orders: [facility] - Est  18-39 yrs (02725)  Complete Medication List: 1)  Proventil Hfa 108 (90 Base) Mcg/act Aers (Albuterol sulfate) .... 2 puffs in am and with shortness of breath. 2)  Bactrim Ds 800-160 Mg Tabs (Sulfamethoxazole-trimethoprim) .... 2 tabs by mouth two times a day x 10 days 3)  Allegra 180 Mg Tabs (Fexofenadine hcl) .[name].. 1 by mouth daily 4)  Singulair 10 Mg Tabs (Montelukast sodium) .[name].. 1 by mouth daily 5)  Plan B One-step 1.5 Mg Tabs (Levonorgestrel) .[name].. 1 by mouth x 1 dose, must use within 72 hours of sexual intercourse 6)  Lotrisone 1-0.05 % Crea (Clotrimazole-betamethasone) .... Apply to affected area on breast twice a day for 7 days  Other Orders: Pap Smear-[facility] ([id number])  Patient Instructions: 1)  I have refilled your Bactrim 2)  For the spot on your breast try the yeast cream- Lotrimin 3)  Please chart your periods 4)  You are  okay to take the morning after pill today 5)  Your blood pressure has been consistently elevated, I recommend that you start an exercise program and watch your salt and sugar intake 6)  If your Blood pressure remains elevated when your return in 3 months , we may need to consider blood pressure pills 7)  We do not have the implanon ready for patients, in the meantime you can consider pills or use of condoms  8)  Call in 6 weeks to see if we have the implanon or you can go to the health dept  Prescriptions: LOTRISONE 1-0.05 % CREA (CLOTRIMAZOLE-BETAMETHASONE) apply to affected area on breast twice a day for 7 days  #1 x 0   Entered and Authorized by:   Milinda Antis MD   Signed by:   Milinda Antis MD on 08/31/2009   Method used:   [facility] to        [facility] [address] [phone]* (  retail)       [address]       [place], [place]  [address]       Ph: [phone] or [phone]       Fax: [phone]   RxID:   [id number] PROVENTIL HFA 108 (90 BASE) MCG/ACT  AERS (ALBUTEROL SULFATE) 2 puffs in am and with shortness of breath.  #1 x 1   Entered and Authorized by:   Milinda Antis MD   Signed by:   Milinda Antis MD on 08/31/2009   Method used:   [facility]        [facility] [address] [phone]* (retail)       [address]       [place], [place]  [address]       Ph: [phone] or [phone]       Fax: [phone]   RxID:   [id number] SINGULAIR 10 MG TABS (MONTELUKAST SODIUM) 1 by mouth daily  #30 x 3   Entered and Authorized by:   Milinda Antis MD   Signed by:   Milinda Antis MD on 08/31/2009   Method used:   [facility]ronically to        [facility] [address] [phone]* (retail)       [address]       [place], [place]  [address]       Ph: [phone] or [phone]       Fax: [phone]   RxID:   [id number] ALLEGRA 180 MG TABS (FEXOFENADINE HCL) 1 by mouth daily  #30 x 3   Entered and  Authorized by:   Milinda Antis MD   Signed by:   Milinda Antis MD on 08/31/2009   Method used:   Electronically to        [facility] [address] [phone]* (retail)       [address]       [place], [place]  [address]       Ph: [phone] or [phone]       Fax: [phone]   RxID:   [id number] BACTRIM DS 800-160 MG  TABS (SULFAMETHOXAZOLE-TRIMETHOPRIM) 2 tabs by mouth two times a day x 10 days  #40 x 0   Entered and Authorized by:   Milinda Antis MD   Signed by:   Milinda Antis MD on 08/31/2009   Method used:   Electronically to        [facility] [address] [phone]* (retail)       [address]       [place], [place]  [address]       Ph: [phone] or [phone]       Fax: [phone]   RxID:   [id number] PLAN B ONE-STEP 1.5 MG TABS (LEVONORGESTREL) 1 by mouth x 1 dose, must use within 72 hours of sexual intercourse  #1 x 0   Entered and Authorized by:   Milinda Antis MD   Signed by:   Milinda Antis MD on 08/31/2009   Method used:   Electronically to        [facility] [address] [phone]* (retail)       [address]       [place], [place]  [address]       Ph: [phone] or [phone]       Fax: [phone]   RxID:   [id number]   Laboratory Results   Urine Tests  Date/Time Received: August 31, 2009 9:40 AM  Date/Time Reported: August 31, 2009 9:47 AM     Urine HCG: negative Comments: ...........test performed by...........Marland KitchenTerese Door, CMA

## 2010-07-13 NOTE — Miscellaneous (Signed)
Summary: Terri [name]  Clinical Lists Changes  Problems: Added new problem of Terri Terri [name] (ICD-305.1) 

## 2010-07-13 NOTE — Progress Notes (Signed)
 Summary: Emergency Line Call  Phone Note Call from Patient Call back at Home Phone [phone]   Caller: Patient Call For: Emergency Line Summary of Call: Pt with one day history of weakness, laying on couch all day, says unable to keep down any food, vomited, NB/NB, No ST, no cough, no body aches, no diarrhea, no rashes, no HA, no neck stiffness.  States was able to keep down recent glass of water.  Recommended if able to keep down fluids then call [facility] in AM and make appt for SDA, if intractable vomiting, go to ED for evaluation. Initial call taken by: Debby Petties MD,  March 31, 2009 7:59 PM

## 2010-07-13 NOTE — Miscellaneous (Signed)
Summary: periods  Clinical Lists Changes had period 12 thru 17.  took plan B last week & 2 weeks ago. profuse bleeding last night. states her periods are very irregular. also cramping. states she usually does not cramp or bleed much. told her likely from using plan B twice in 2 weeks. to drink plenty of water. monitor bleeding.  if saturating a pad every hour, [facility] if not, this will get better. when questioned, states she uses condoms. says she is waiting for the health dept to get the implanon. told her we do them here. says she has been unsuccessful with other methods. she declined an appt for now. but wants to get the implanon asap. message to pcp since her schedule is full. put her in colpo?Marland KitchenGolden Circle RN  December 07, 2009 9:44 AM      Please put her in Thurs July 7, in the AM, Dr. Jennette Kettle should be here to precept at that time. Milinda Antis MD  December 07, 2009 9:52 AM  LM for pt to call me back on one number. reached her on another. implanon scheduled per above.Golden Circle RN  December 07, 2009 11:38 AM

## 2010-07-13 NOTE — Assessment & Plan Note (Signed)
Summary: implanon placement/[place]   Vital Signs:  Patient profile:   25 year old female Height:      68 inches Weight:      256.7 pounds Pulse rate:   100 / minute BP sitting:   152 / 100  (right arm) Cuff size:   large  Vitals Entered By: Arlyss Repress CMA, (December 16, 2009 9:55 AM)  Serial Vital Signs/Assessments:  Time      Position  BP       Pulse  Resp  Temp     By                     148/96                         Milinda Antis MD  CC: Implanon insertion. Is Patient Diabetic? No Pain Assessment Patient in pain? no        Primary Care Provider:  Milinda Antis MD  CC:  Implanon insertion.[name]  History of Present Illness:  Pt here for implanon- insertion- UPREG neg, risks and benefits and procedure explanied to pt   NOted elevated BP- pt very nervous , sweaty   Habits & Providers  Alcohol-Tobacco-Diet     Tobacco Status: quit  Current Medications (verified): 1)  Proventil Hfa 108 (90 Base) Mcg/act  Aers (Albuterol Sulfate) .... 2 Puffs in Am and With Shortness of Breath. 2)  Allegra 180 Mg Tabs (Fexofenadine Hcl) .[name].. 1 By Mouth Daily 3)  Singulair 10 Mg Tabs (Montelukast Sodium) .[name].. 1 By Mouth Daily 4)  Flonase 50 Mcg/act Susp (Fluticasone Propionate) .... 2  Spray Each Nostril Daily 5)  Implanon 68 Mg Impl (Etonogestrel) .... Left Arm Inserted 12/16/09  Allergies (verified): No Known Drug Allergies  Social History: Smoking Status:  quit  Physical Exam  General:  Obese,in no acute distress; alert,appropriate and cooperative throughout examination.  Vitals noted. Additional Exam:  Left ARm  Procedure- risks, benefits and procedure explained, consent form obtained and signs, all questions answered Area prepped and clean with ETOH wipe and betadine 1% lidocaine w/epi  used for anesthetic  Implanon inserted approx 10 cm from medial epicondyle on inner portion of arm Plunger indentified and implanon palpated minimal blood loss pt tolerated procedure  well  Attending- Dr. Leveda Anna   Impression & Recommendations:  Problem # 1:  CONTRACEPTIVE MANAGEMENT (ICD-V25.09) Assessment New Implanon inserted  Orders: U Preg-[facility] (04540)  Insertion implantable contraceptive capsules   ([id number])   Noted elevated BP, recheck next visit, if elevated start HCTZ   Complete Medication List: 1)  Proventil Hfa 108 (90 Base) Mcg/act Aers (Albuterol sulfate) .... 2 puffs in am and with shortness of breath. 2)  Allegra 180 Mg Tabs (Fexofenadine hcl) .[name].. 1 by mouth daily 3)  Singulair 10 Mg Tabs (Montelukast sodium) .[name].. 1 by mouth daily 4)  Flonase 50 Mcg/act Susp (Fluticasone propionate) .... 2  spray each nostril daily 5)  Implanon 68 Mg Impl (Etonogestrel) .... Left arm inserted 12/16/09  Other Orders: Insertion, non biodegradable drug deliver implant, ([id number]) Etonogestrel implant system, including implant and supplie ([id number])  Laboratory Results   Urine Tests  Date/Time Received: December 16, 2009 10:07 AM  Date/Time Reported: December 16, 2009 10:18 AM     Urine HCG: negative Comments: ...............test performed by......Marland KitchenBonnie A. [place], MLS (ASCP)cm

## 2010-07-13 NOTE — Progress Notes (Signed)
  Phone Note Call from Patient   Caller: Patient Details for Reason: meds question Summary of Call:   Pt has a cold, runny nose , cough and body aches wanted to know if she could take Nyquil and Advil at the same time. Advised to take Nyquil first if she still has discomoft in the AM can take Advil. Told her to call for work-in if no improvement. Initial call taken by: Milinda Antis MD,  January 19, 2010 11:27 PM

## 2010-07-13 NOTE — Progress Notes (Signed)
Summary: refill  Phone Note Refill Request Call back at [phone] Message from:  Patient on November 29, 2009 2:06 PM  Refills Requested: Medication #1:  PLAN B ONE-STEP 1.5 MG TABS 1 by mouth x 1 dose  Medication #2:  BACTRIM DS 800-160 MG  TABS 2 tabs by mouth two times a day x 10 days Initial call taken by: Loralee Pacas CMA,  November 29, 2009 2:07 PM Caller: Patient  Follow-up for Phone Call        pcp i son vacation. told pt she will need to be seen . states she gets the antibiotics for her recurring boils. unprotected sex saturday. wants rx for plan B as medicaid will pay for it if it is a rx. she is unable to get here before 5. sent to [facility] discussed contraception. usually uses a condom. suggested adding a spermacide to this. states she is one the waiting list at health dept & the told her to call them back tomorrow. Follow-up by: Golden Circle RN,  November 29, 2009 2:34 PM

## 2010-07-14 NOTE — Miscellaneous (Signed)
  Clinical Lists Changes  Problems: Removed problem of METRORRHAGIA (ICD-626.6) Removed problem of BACK STRAIN, LUMBAR (ICD-847.2) Removed problem of ORAL CONTRACEPTION (ICD-V25.41) Removed problem of DEPRESSION, HX OF (ICD-V11.8) Observations: Added new observation of PAST MED HX: G1P1,  MRSA  abscess, R thigh  (06/2005) HTN- no meds h/o depression Asthma Allergic Rhinitis (06/07/2010 23:49)      Past Medical History:    G1P1,     MRSA  abscess, R thigh  (06/2005)    HTN- no meds    h/o depression    Asthma    Allergic Rhinitis

## 2010-08-10 ENCOUNTER — Emergency Department (HOSPITAL_COMMUNITY)
Admission: EM | Admit: 2010-08-10 | Discharge: 2010-08-10 | Disposition: A | Discharge status: Home / Self Care | Payer: Medicaid Other | Attending: Emergency Medicine | Admitting: Emergency Medicine

## 2010-08-10 DIAGNOSIS — K5289 Other specified noninfective gastroenteritis and colitis: Secondary | ICD-10-CM | POA: Insufficient documentation

## 2010-08-10 LAB — DIFFERENTIAL
Basophils Absolute: 0 10*3/uL (ref 0.0–0.1)
Basophils Relative: 0 % (ref 0–1)
Neutro Abs: 2.3 10*3/uL (ref 1.7–7.7)
Neutrophils Relative %: 58 % (ref 43–77)

## 2010-08-10 LAB — CBC
Hemoglobin: 13.8 g/dL (ref 12.0–15.0)
RBC: 4.63 MIL/uL (ref 3.87–5.11)
WBC: 3.9 10*3/uL — ABNORMAL LOW (ref 4.0–10.5)

## 2010-08-10 LAB — URINALYSIS, ROUTINE W REFLEX MICROSCOPIC
Protein, ur: NEGATIVE mg/dL
Specific Gravity, Urine: 1.028 (ref 1.005–1.030)
Urine Glucose, Fasting: NEGATIVE mg/dL
Urobilinogen, UA: 1 mg/dL (ref 0.0–1.0)

## 2010-08-10 LAB — POCT I-STAT, CHEM 8
Hemoglobin: 14.6 g/dL (ref 12.0–15.0)
Sodium: 139 mEq/L (ref 135–145)
TCO2: 22 mmol/L (ref 0–100)

## 2010-08-10 LAB — POCT PREGNANCY, URINE: Preg Test, Ur: NEGATIVE

## 2010-08-24 LAB — URINALYSIS, ROUTINE W REFLEX MICROSCOPIC
Bilirubin Urine: NEGATIVE
Glucose, UA: NEGATIVE mg/dL
Ketones, ur: NEGATIVE mg/dL
Nitrite: NEGATIVE
Protein, ur: 100 mg/dL — AB
Specific Gravity, Urine: 1.022 (ref 1.005–1.030)
Urobilinogen, UA: 1 mg/dL (ref 0.0–1.0)
pH: 6.5 (ref 5.0–8.0)

## 2010-08-24 LAB — URINE CULTURE

## 2010-08-24 LAB — RAPID URINE DRUG SCREEN, HOSP PERFORMED
Amphetamines: NOT DETECTED
Barbiturates: NOT DETECTED
Benzodiazepines: NOT DETECTED
Cocaine: NOT DETECTED
Opiates: NOT DETECTED
Tetrahydrocannabinol: POSITIVE — AB

## 2010-08-24 LAB — URINE MICROSCOPIC-ADD ON

## 2010-08-30 ENCOUNTER — Encounter: Payer: Self-pay | Admitting: Family Medicine

## 2010-09-13 LAB — GC/CHLAMYDIA PROBE AMP, GENITAL: Chlamydia, DNA Probe: NEGATIVE

## 2010-09-13 LAB — WET PREP, GENITAL

## 2010-10-09 ENCOUNTER — Telehealth: Payer: Self-pay | Admitting: Family Medicine

## 2010-10-09 NOTE — Telephone Encounter (Signed)
Menstrual bleeding is bright orange starting yesterday. Light bleeding. Periods irregular since Implanon x 1 year. This has happened before one time. No cramping. Feels fine. Advised to monitor for now - if bleeding becomes heavy or painful should call for appointment. Otherwise likely mix of vaginal discharge and menstrual blood.

## 2010-10-28 NOTE — H&P (Signed)
NAME:  Terri, [name] NO.:  [id number]   MEDICAL RECORD NO.:  [id number]          PATIENT TYPE:  INP   LOCATION:  5037                         FACILITY:  [facility]   PHYSICIAN:  Melina Fiddler, MD DATE OF BIRTH:  1985-11-20   DATE OF ADMISSION:  06/25/2005  DATE OF DISCHARGE:                                HISTORY & PHYSICAL   HISTORY OF PRESENT ILLNESS:  This is a 25 year old African American female  with history of asthma who presented with a 2 day history of lump in her  thigh on the right accompanied by swelling, erythema and tenderness. The  patient noted progressive increase in the size of the swelling and erythema.  Yesterday the patient developed a fever of 105 and there was note of  purulent discharge draining from the swollen site.  The patient was advised  to go the ED by the physician on call for evaluation.  The patient generally  does not feel well and has been feeling weak and had decreased p.o. intake  all day.   PAST MEDICAL HISTORY:  Asthma, exercise induced.   MEDICATIONS:  1.  Albuterol 1 inhalation p.r.n.  2.  Allegra p.r.n.  3.  Singulair 1 tablet daily.   ALLERGIES:  No known drug allergies.   PERSONAL AND SOCIAL HISTORY:  The patient moved from [place] from  [place] in 2003.  She lives with her mom.  She is senior in [employer] at  [employer].  She recently delivered her first baby.  She  continues to smoke about 10 cigarettes per week.  No alcohol intake.   REVIEW OF SYSTEMS:  Positive for fever, weakness, decreased p.o. intake,  redness and swelling of the right inner thigh as described in HPI. No chest  pain, cough, dysuria, frequency, abdominal pain, nausea, vomiting nor  diarrhea.  No other skin lesions or rash.   PHYSICAL EXAMINATION:  VITAL SIGNS:  Blood pressure 143/93, heart rate of  143, temperature of 103.  Respiratory rate of 20.  O2 saturation 98% on room  air.  GENERAL:  This is a pleasant African  American female who is awake, alert,  coherent oriented x 3.  Looks ill, not in distress.  HEENT:  Conjunctivae, anicteric sclerae.  Tympanic membranes within normal  limits.  Dry oral mucosa.  No positive wheeze or congestion.  NECK:  No cervical lymphadenopathy.  No thyromegaly.  LUNGS:  Clear to auscultation bilaterally.  No crackles, no wheezes, no  rhonchi.  No increased work with breathing.  CVS:  Tachycardic.  No murmurs appreciated.  ABDOMEN:  Normal bowel sounds.  Soft, no masses, no tenderness.  EXTREMITIES:  There is note of erythema, swelling and tenderness at the  right inner thigh measuring about 20 x 15 cm with an area of induration of  about 12 x 10 cm.  There is no area of fluctuance appreciated.  NEUROLOGICAL:  No lateralizing signs.   LABORATORY DATA:  CBC:  BMET blood cultures pending.   ASSESSMENT:  A 25 year old African American female admitted with right thigh  cellulitis.  1.  Cellulitis of the right thigh.  The patient's symptoms are acute and      progressive over the past few days, concern for MRSA cellulitis.  Also      concern for possible abscess formation.  We will do blood pressures and      start empiric antibiotic treatment with Vancomycin and await cultures.      The patient will need I and D if there is on response to antibiotics.      We will consult surgery.  2.  Asthma, stable on Albuterol MDI, p.r.n.  3.  Fluids, electrolytes, nutrition.  IV fluids normal saline solution of 75      cc/hour.  The patient may have regular diet.  4.  Social: Discussed with the patient the need for admission.  The patient      agreeable with plan of care.      Lawerance Sabal, MD    ______________________________  Melina Fiddler, MD    [facility]/MEDQ  D:  06/25/2005  T:  06/26/2005  Job:  [id number]   cc:   Lawerance Sabal, MD  Fax: [phone]

## 2010-10-28 NOTE — Op Note (Signed)
NAME:  Terri [name], Terri [name] NO.:  [id number]   MEDICAL RECORD NO.:  [id number]          PATIENT TYPE:  INP   LOCATION:  9129                          FACILITY:  [facility]   PHYSICIAN:  Roseanna Rainbow, M.D.DATE OF BIRTH:  1986/05/13   DATE OF PROCEDURE:  02/17/2005  DATE OF DISCHARGE:  02/19/2005                                 OPERATIVE REPORT   ADDENDUM TO THE DELIVERY NOTE:  The patient was delivered by Edmonia Lynch, nurse midwifery student.  I was called after the delivery to  assist with the repair of bilateral lower vaginal sulcal, labial  lacerations.  These lacerations were reapproximated after infiltration with  lidocaine using interrupted sutures of 2-0 and 3-0 Vicryl Rapide.  Adequate hemostasis was noted.      Roseanna Rainbow, M.D.  Electronically Signed     [facility]/MEDQ  D:  03/02/2005  T:  03/02/2005  Job:  [id number]

## 2010-10-28 NOTE — Discharge Summary (Signed)
NAME:  Terri [name], Terri [name] NO.:  [id number]   MEDICAL RECORD NO.:  [id number]          PATIENT TYPE:  INP   LOCATION:  5037                         FACILITY:  [facility]   PHYSICIAN:  Rolm Gala, M.D.    DATE OF BIRTH:  Nov 11, 1985   DATE OF ADMISSION:  06/25/2005  DATE OF DISCHARGE:  06/29/2005                                 DISCHARGE SUMMARY   PRIMARY CARE PHYSICIAN:  Dr. Evonnie Pat at [facility].   CONSULTING PHYSICIANS:  [facility].   FINAL DIAGNOSES:  1.  Methicillin-Resistant Staphylococcus Aureus groin abscess.  2.  Asthma.   PRINCIPAL PROCEDURE:  I&D of groin abscess on June 27, 2005 by [facility].   LABORATORY DATA:  A wound culture from June 26, 2005 showed MRSA with  sensitivities to levofloxacin, rifampin, Bactrim, Vancomycin and  tetracycline.  A CBC on June 28, 2005 showed white blood cells 4.8, H&H  10.4/30.3, platelets 254.  A urine culture showed multiple bacterial  morphites.  Urine pregnancy test was negative.  A second urine culture was  negative.  Blood culture from June 25, 2005 was negative.   HOSPITAL COURSE:  This is a 25 year old female who came in complaining of a  right groin abscess.   PROBLEM:  1.  Right groin abscess.  An I&D was done on June 27, 2005 and grew out      MRSA.  The patient was given four days of Vancomycin and then switched      to Bactrim two double strength p.o. for a total of 14 days of antibiotic      treatment.  2.  Yeast infection.  The patient was given Diflucan in the hospital to      treat this infection and as an outpatient she was given terconazole and      told to take two Diflucan at the end of her antibiotic treatment.  3.  Pain.  The patient had pain with dressing changes.  She was sent home      with Vicodin #60.  She was also given Ativan 0.5 to take as needed prior      to dressing changes, but was also told not to breast feed after taking      the  Ativan for four hours.  It was suggested that she may pump to keep      her milk supply in.  4.  Asthma.  The patient was kept on her home meds.   DISCHARGE MEDICATIONS:  1.  Bactrim DS take two pills p.o. twice daily.  2.  Ativan 0.5 mg take as needed prior to dressing changes.  3.  Vicodin take prior to dressing changes and every 4-6 hours as needed.  4.  Terconazole put in vagina nightly for seven days.  5.  Diflucan 150 mg p.o. take two pills after finishing the antibiotics.      Rolm Gala, M.D.     Bennetta Laos  D:  06/29/2005  T:  06/29/2005  Job:  [id number]   cc:   Lawerance Sabal, MD  Fax: [phone]  Daphine Deutscher, MD  [facility]

## 2010-10-28 NOTE — Op Note (Signed)
NAME:  Terri [name], Terri [name] NO.:  [id number]   MEDICAL RECORD NO.:  [id number]          PATIENT TYPE:  INP   LOCATION:  5037                         FACILITY:  [facility]   PHYSICIAN:  Currie Paris, M.D.DATE OF BIRTH:  08/21/1985   DATE OF PROCEDURE:  06/27/2005  DATE OF DISCHARGE:                                 OPERATIVE REPORT   PREOP DIAGNOSIS:  Thigh abscess.   POSTOPERATIVE DIAGNOSIS:  Thigh abscess.   OPERATION:  Incision and drainage of thigh abscess.   SURGEON:  Dr. Jamey Ripa.   ANESTHESIA:  Local.   CLINICAL HISTORY:  [name] is a 25 year old with a thigh abscess that has  started draining. She had been started on antibiotics. After discussion with  the patient, we elected to do an I&D in the room.   DESCRIPTION OF PROCEDURE:  In the patient's room the area was identified. It  was anesthetized with 1% Xylocaine with epi. There was and open area on the  skin which was excised. An abscess cavity which tracked somewhat posterior  was identified and drained and then packed. There was a fair amount of  induration but I think this was the only significant abscess cavity present.   Sterile dressings were applied.She will recive ongoing local wound care.      Currie Paris, M.D.  Electronically Signed     CJS/MEDQ  D:  06/27/2005  T:  06/27/2005  Job:  [id number]

## 2010-11-02 ENCOUNTER — Encounter: Payer: Self-pay | Admitting: Family Medicine

## 2010-11-02 ENCOUNTER — Ambulatory Visit (INDEPENDENT_AMBULATORY_CARE_PROVIDER_SITE_OTHER): Payer: Medicaid Other | Admitting: Family Medicine

## 2010-11-02 VITALS — BP 145/87 | HR 85 | Temp 98.5°F | Ht 68.0 in | Wt 248.0 lb

## 2010-11-02 DIAGNOSIS — M546 Pain in thoracic spine: Secondary | ICD-10-CM

## 2010-11-02 DIAGNOSIS — L0293 Carbuncle, unspecified: Secondary | ICD-10-CM

## 2010-11-02 DIAGNOSIS — L0292 Furuncle, unspecified: Secondary | ICD-10-CM

## 2010-11-02 MED ORDER — FLUTICASONE PROPIONATE 50 MCG/ACT NA SUSP: 2.0000 | Freq: Every day | NASAL | Status: DC

## 2010-11-02 MED ORDER — FLUCONAZOLE 150 MG PO TABS: 150.0000 mg | ORAL_TABLET | Freq: Once | ORAL | Status: DC

## 2010-11-02 MED ORDER — SULFAMETHOXAZOLE-TRIMETHOPRIM 800-160 MG PO TABS: ORAL_TABLET | ORAL | Status: DC

## 2010-11-02 NOTE — Patient Instructions (Signed)
Use the antibiotics for the boils Use vaseline for the dry spots I will make some calls for the surgery Take the fluconzaole after the antibiotics

## 2010-11-02 NOTE — Progress Notes (Signed)
  Subjective:    Patient ID: Terri [name], female    DOB: 1985-09-27, 25 y.o.   MRN: [id number]  HPI Back pain- pt breast size is a 42DD, worsened back pain in thoracic and shoulder over the past 3 months , feels that breast are the culpit, she has changed bras and using other things for support but feels the weight of her breast, would like a breast reduction. Pain has been present for years since giving birth to her son approx  5 years ago Boils- recurrent boils in groin area, needs antibiotics refilled has no needed antibiotics in many months, using compresses, one lesion has opened and drained already , often occur around menses  Pt did note 1 month ago during menses had orange tint, this has not occurred again- no abd pain, no vaginal discharge   Review of Systems  No fever, no tingling in lower ext, no problems with bowel or bladder     Objective:   Physical Exam   GEN- NAD, alert and oriented, obese  Neck- normal ROM   SKIN- small boil on right inner thigh- minimal fluctuance, no erythema, opening seen, many hair bumps, post-inflammatory scars throughout groin, small dry spots on bilat arms, no ezcematous rash seen   Back- spine non tender, normal ROM upper ext, non tender over paraspinals in thoracic region, bra lines noted on back and shoulders, no skin lesions at the regions of pain       Assessment & Plan:    Recurrent boils - pt to use Dial soap or other antibacterial, refilled bactrim, as lesion draining now and no signs of cellulitis can hold on antibiotics at this time, given diflucan for use if antibiotics needed   Back pain- pt with upper back pain, no red flags, unsure if any surgeon would cover breast reduction, will check into this as pt has medicaid Continue with as much support as possible

## 2010-11-17 ENCOUNTER — Encounter: Payer: Self-pay | Admitting: Family Medicine

## 2011-01-04 ENCOUNTER — Telehealth: Payer: Self-pay | Admitting: Family Medicine

## 2011-01-04 NOTE — Telephone Encounter (Signed)
Pt has been bleeding/clots since the 2nd week in July - has Implanon and is needing to talk to nurse

## 2011-01-04 NOTE — Telephone Encounter (Signed)
Returned call,  Got VM so left message to call us back.

## 2011-01-05 ENCOUNTER — Ambulatory Visit: Payer: Medicaid Other

## 2011-01-05 NOTE — Telephone Encounter (Signed)
Patient has a h/o irregular periods.  1st day of LMP was 7/1 ending on 7/4.  Flow was light as usual.  Two days after stopping she started spotting for a couple of days then flow became heavy with clots.  Her Implanon was inserted a year ago.  Advised her to be seen.  Gave her a [place] appt for this afternoon.

## 2011-01-26 ENCOUNTER — Emergency Department (HOSPITAL_BASED_OUTPATIENT_CLINIC_OR_DEPARTMENT_OTHER)
Admission: EM | Admit: 2011-01-26 | Discharge: 2011-01-26 | Disposition: A | Discharge status: Home / Self Care | Payer: Medicaid Other | Attending: Emergency Medicine | Admitting: Emergency Medicine

## 2011-01-26 ENCOUNTER — Emergency Department (INDEPENDENT_AMBULATORY_CARE_PROVIDER_SITE_OTHER): Payer: Medicaid Other

## 2011-01-26 ENCOUNTER — Encounter: Payer: Self-pay | Admitting: *Deleted

## 2011-01-26 ENCOUNTER — Telehealth: Payer: Self-pay | Admitting: Family Medicine

## 2011-01-26 DIAGNOSIS — Z9119 Patient's noncompliance with other medical treatment and regimen: Secondary | ICD-10-CM | POA: Insufficient documentation

## 2011-01-26 DIAGNOSIS — R51 Headache: Secondary | ICD-10-CM

## 2011-01-26 DIAGNOSIS — R42 Dizziness and giddiness: Secondary | ICD-10-CM | POA: Insufficient documentation

## 2011-01-26 DIAGNOSIS — Z91199 Patient's noncompliance with other medical treatment and regimen due to unspecified reason: Secondary | ICD-10-CM | POA: Insufficient documentation

## 2011-01-26 DIAGNOSIS — I10 Essential (primary) hypertension: Secondary | ICD-10-CM | POA: Insufficient documentation

## 2011-01-26 HISTORY — DX: Essential (primary) hypertension: I10

## 2011-01-26 LAB — BASIC METABOLIC PANEL
CO2: 27 mEq/L (ref 19–32)
Calcium: 9.7 mg/dL (ref 8.4–10.5)
GFR calc non Af Amer: >60 mL/min (ref >60)
Sodium: 138 mEq/L (ref 135–145)

## 2011-01-26 LAB — DIFFERENTIAL
Basophils Absolute: 0 10*3/uL (ref 0.0–0.1)
Eosinophils Relative: 1 % (ref 0–5)
Lymphocytes Relative: 43 % (ref 12–46)
Neutro Abs: 2.9 10*3/uL (ref 1.7–7.7)
Neutrophils Relative %: 49 % (ref 43–77)

## 2011-01-26 LAB — CBC
MCV: 89.3 fL (ref 78.0–100.0)
Platelets: 297 10*3/uL (ref 150–400)
RDW: 12.5 % (ref 11.5–15.5)
WBC: 5.9 10*3/uL (ref 4.0–10.5)

## 2011-01-26 LAB — PREGNANCY, URINE: Preg Test, Ur: NEGATIVE

## 2011-01-26 MED ORDER — KETOROLAC TROMETHAMINE 30 MG/ML IJ SOLN
30.0000 mg | Freq: Once | INTRAMUSCULAR | Status: DC
  Filled 2011-01-26: qty 1

## 2011-01-26 MED ORDER — MORPHINE SULFATE 4 MG/ML IJ SOLN
4.0000 mg | Freq: Once | INTRAMUSCULAR | Status: AC
  Administered 2011-01-26: 4 mg via INTRAVENOUS
  Filled 2011-01-26: qty 1

## 2011-01-26 MED ORDER — DEXAMETHASONE SODIUM PHOSPHATE 10 MG/ML IJ SOLN
10.0000 mg | Freq: Once | INTRAMUSCULAR | Status: AC
  Administered 2011-01-26: 10 mg via INTRAVENOUS
  Filled 2011-01-26: qty 1

## 2011-01-26 MED ORDER — LISINOPRIL 10 MG PO TABS
10.0000 mg | ORAL_TABLET | Freq: Once | ORAL | Status: AC
  Administered 2011-01-26: 10 mg via ORAL
  Filled 2011-01-26: qty 1

## 2011-01-26 MED ORDER — HYDROCHLOROTHIAZIDE 25 MG PO TABS: 25.0000 mg | ORAL_TABLET | Freq: Every day | ORAL | Status: DC

## 2011-01-26 MED ORDER — IBUPROFEN 800 MG PO TABS: 800.0000 mg | ORAL_TABLET | Freq: Three times a day (TID) | ORAL | Status: AC

## 2011-01-26 MED ORDER — SODIUM CHLORIDE 0.9 % IV BOLUS (SEPSIS)
1000.0000 mL | Freq: Once | INTRAVENOUS | Status: AC
  Administered 2011-01-26 (×2): 1000 mL via INTRAVENOUS

## 2011-01-26 MED ORDER — METOCLOPRAMIDE HCL 5 MG/ML IJ SOLN
10.0000 mg | Freq: Once | INTRAMUSCULAR | Status: AC
  Administered 2011-01-26: 10 mg via INTRAVENOUS
  Filled 2011-01-26: qty 2

## 2011-01-26 NOTE — ED Notes (Signed)
Spoke with Pt. Friend on the phone about coming to get her.

## 2011-01-26 NOTE — ED Provider Notes (Signed)
History     CSN: [id number] Arrival date & time: 01/26/2011  3:02 PM  Chief Complaint  Patient presents with  . Headache   The history is provided by the patient. History Limited By: Uncooperative.  Pt has HTN and has not taken her medication in approx 2 months.  Developed severe, diffuse, throbbing headache w/ associated dizziness this am.  Dizziness caused her to have an un-witnessed syncopal episode.  Has not had a headache this severe in a long time.  Will not say why she has been non-compliant.  Denies head trauma.    Past Medical History  Diagnosis Date  . Hypertention, malignant, with acute intensive management     History reviewed. No pertinent past surgical history.  No family history on file.  History  Substance Use Topics  . Smoking status: Not on file  . Smokeless tobacco: Not on file  . Alcohol Use: No    OB History    Grav Para Term Preterm Abortions TAB SAB Ect Mult Living                  Review of Systems  Unable to perform ROS: Other    Physical Exam  BP 170/105  Pulse 78  Temp(Src) 98.8 F (37.1 C) (Oral)  Resp 20  SpO2 100%  LMP 01/26/2011  Physical Exam  Nursing note and vitals reviewed. Constitutional: She is oriented to person, place, and time. She appears well-developed and well-nourished. No distress.  HENT:  Head: Normocephalic and atraumatic.  Eyes:       Normal appearance  Neck: Normal range of motion.  Cardiovascular: Normal rate and regular rhythm.   Pulmonary/Chest: Effort normal and breath sounds normal.  Musculoskeletal: Normal range of motion.  Neurological: She is alert and oriented to person, place, and time. She has normal reflexes. No cranial nerve deficit or sensory deficit. She displays a negative Romberg sign. Coordination and gait normal.       5/5 and equal upper and lower extremity strength.  No past pointing.  No pronator drift.    Skin: Skin is warm and dry. No rash noted.  Psychiatric: Her affect is angry. She  is agitated and combative. Cognition and memory are normal.       Pt threw a cup of water on the floor because mad about her wait time.  She refuses to answer some of my questions initially.  Pushes needle away while nurse trying to start IV.     ED Course  Procedures  MDM Pt presents w/ uncontrolled HTN and headache.  Non-compliant w/ meds.  No head trauma.  Afebrile and no focal neuro deficits on exam.  Pt initially agitated and combative and refused to answer some of my questions/be examined until told she would otherwise not receive pain medication.  She is currently calm.  IV fluids, decadron and reglan started.  CT head pending.  Trying to determine BP medication.  CT head neg.  Pain did not improve w/ reglan/decadron.  Gave 4mg  IV morphine an pain improved.  Spoke w/ [facility] and no record of pt being on anti-hypertensive in past.   Dr. Ashley Royalty recommends HCTZ and f/u in one week.  Pt d/c'd home w/ ibuprofen and HCTZ.          Otilio Miu, [place] 01/27/11 [phone]

## 2011-01-26 NOTE — ED Notes (Signed)
Vital signs stable. 
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
## 2011-01-26 NOTE — ED Notes (Signed)
Pt. Is being more cooperative at this time.  Pt. Has warm blanket and call bell at bedside.

## 2011-01-26 NOTE — ED Notes (Signed)
Pt. To receive more medicine after the Pt. CT scan has been read.

## 2011-01-26 NOTE — Telephone Encounter (Signed)
Blood Pressure is really high and is at a medical facility in [facility] and no one is helping her.  Dennison Nancy, RN spoke with her.

## 2011-01-26 NOTE — ED Notes (Signed)
Pt. Waiting on her Ride to get heree.

## 2011-01-26 NOTE — ED Notes (Signed)
Pt.. Cont to report she has not been seen by anyone here.  Pt. Has been seen by 3 RNs AND FOOD HAS BEEN GIVEN TO THE CHILD AT HER SIDE.

## 2011-01-26 NOTE — ED Notes (Signed)
Pt. Reports to EMS she passed due to feeling dizzy.  NO c/o nausea or vomiting.  Pt. Does reports sensitivity to light.  Pt. Reports to EMS she has had no B/P med in a month.  Pt. CBG 85 and B/P 170 /120  100% O2.   Pt. Uncooperative with EMS and on arrival   Pt. Does not know name of med she was taking for B/P.  Pt. Brought her son with her to EMS.  Pt.  Reports pain 10/10.

## 2011-01-26 NOTE — ED Notes (Signed)
Pt. Feeling better and walks stedy gait to restroom

## 2011-02-04 NOTE — ED Provider Notes (Signed)
Medical screening examination/treatment/procedure(s) were performed by non-physician practitioner and as supervising physician I was immediately available for consultation/collaboration.  Alessandria Henken T Kasper Mudrick, MD 02/04/11 1536 

## 2011-03-02 LAB — POCT URINALYSIS DIP (DEVICE)
Bilirubin Urine: NEGATIVE
Glucose, UA: NEGATIVE
Ketones, ur: NEGATIVE
Operator id: 270961

## 2011-03-02 LAB — WET PREP, GENITAL: WBC, Wet Prep HPF POC: NONE SEEN

## 2011-03-02 LAB — GC/CHLAMYDIA PROBE AMP, GENITAL: Chlamydia, DNA Probe: NEGATIVE

## 2011-03-02 LAB — POCT PREGNANCY, URINE: Preg Test, Ur: NEGATIVE

## 2011-03-08 ENCOUNTER — Encounter: Payer: Medicaid Other | Admitting: Family Medicine

## 2011-03-13 LAB — POCT PREGNANCY, URINE: Preg Test, Ur: NEGATIVE

## 2011-03-13 LAB — POCT URINALYSIS DIP (DEVICE)
Glucose, UA: NEGATIVE
Nitrite: POSITIVE — AB
Operator id: 247071

## 2011-03-20 LAB — I-STAT 8, (EC8 V) (CONVERTED LAB)
Bicarbonate: 26.5 — ABNORMAL HIGH
HCT: 41
Hemoglobin: 13.9
Operator id: 272551
pCO2, Ven: 47.9
pH, Ven: 7.351 — ABNORMAL HIGH

## 2011-03-20 LAB — POCT I-STAT CREATININE: Creatinine, Ser: 1.1

## 2011-03-21 ENCOUNTER — Ambulatory Visit (INDEPENDENT_AMBULATORY_CARE_PROVIDER_SITE_OTHER): Payer: Self-pay | Admitting: Family Medicine

## 2011-03-21 ENCOUNTER — Encounter: Payer: Self-pay | Admitting: Family Medicine

## 2011-03-21 VITALS — BP 160/103 | HR 90 | Temp 98.4°F | Wt 254.0 lb

## 2011-03-21 DIAGNOSIS — I10 Essential (primary) hypertension: Secondary | ICD-10-CM | POA: Insufficient documentation

## 2011-03-21 LAB — WET PREP, GENITAL
Clue Cells Wet Prep HPF POC: NONE SEEN
Yeast Wet Prep HPF POC: NONE SEEN

## 2011-03-21 LAB — POCT URINALYSIS DIP (DEVICE)
Bilirubin Urine: NEGATIVE
Hgb urine dipstick: NEGATIVE
Nitrite: NEGATIVE
Specific Gravity, Urine: 1.025
Urobilinogen, UA: 1
pH: 7

## 2011-03-21 LAB — BASIC METABOLIC PANEL
CO2: 24 mEq/L (ref 19–32)
Calcium: 9.4 mg/dL (ref 8.4–10.5)
Sodium: 138 mEq/L (ref 135–145)

## 2011-03-21 LAB — GC/CHLAMYDIA PROBE AMP, GENITAL: GC Probe Amp, Genital: NEGATIVE

## 2011-03-21 MED ORDER — AMLODIPINE BESYLATE 10 MG PO TABS: 10.0000 mg | ORAL_TABLET | Freq: Every day | ORAL | Status: DC

## 2011-03-21 MED ORDER — HYDROCHLOROTHIAZIDE 25 MG PO TABS: 25.0000 mg | ORAL_TABLET | Freq: Every day | ORAL | Status: DC

## 2011-03-21 NOTE — Patient Instructions (Signed)

## 2011-03-21 NOTE — Progress Notes (Signed)
  Subjective:    Patient ID: Terri [name], female    DOB: 1986-06-10, 25 y.o.   MRN: [id number]  HPI Pt here for evaluation of HTN. Baseline hx/o HTN. On HCTZ. Pt has been non-compliant with this medication. + HA. No CP, SOB.    Review of Systems See HPI     Objective:   Physical Exam Gen: up in chair, NAD HEENT: NCAT, EOMI, TMs clear bilaterally CV: RRR, no murmurs auscultated PULM: CTAB, no wheezes, rales, rhoncii ABD: S/NT/+ bowel sounds  EXT: 2+ peripheral pulses   Assessment & Plan:

## 2011-03-21 NOTE — Assessment & Plan Note (Signed)
BMET today. Added norvasc to regimen. Stressed compliance. BP recheck in 1 week.

## 2011-03-22 ENCOUNTER — Encounter: Payer: Self-pay | Admitting: Family Medicine

## 2011-03-27 ENCOUNTER — Ambulatory Visit: Payer: Self-pay | Admitting: Family Medicine

## 2011-03-27 ENCOUNTER — Ambulatory Visit (INDEPENDENT_AMBULATORY_CARE_PROVIDER_SITE_OTHER): Payer: Self-pay | Admitting: Family Medicine

## 2011-03-27 ENCOUNTER — Encounter: Payer: Self-pay | Admitting: Family Medicine

## 2011-03-27 VITALS — BP 149/113 | HR 99 | Temp 98.0°F | Ht 68.0 in | Wt 253.0 lb

## 2011-03-27 DIAGNOSIS — F39 Unspecified mood [affective] disorder: Secondary | ICD-10-CM

## 2011-03-27 DIAGNOSIS — F319 Bipolar disorder, unspecified: Secondary | ICD-10-CM

## 2011-03-27 DIAGNOSIS — I10 Essential (primary) hypertension: Secondary | ICD-10-CM

## 2011-03-27 DIAGNOSIS — F3130 Bipolar disorder, current episode depressed, mild or moderate severity, unspecified: Secondary | ICD-10-CM

## 2011-03-27 MED ORDER — QUETIAPINE FUMARATE 50 MG PO TABS: 50.0000 mg | ORAL_TABLET | Freq: Every day | ORAL | Status: DC

## 2011-03-27 NOTE — Patient Instructions (Signed)
Be sure to check your blood pressures daily  You can switch the norvasc to [facility] so that you can get a 3 month supply for 10 dollar I am starting you on seroquel for your mood Come back later this week for a blood pressure check after you have taken your blood pressure medication for a few days I would to set you up in our mood disorder clinic, Come back to see me in 1-2 weeks,  Call with any questions, God Bless, Doree Albee MD

## 2011-03-27 NOTE — Progress Notes (Signed)
  Subjective:    Patient ID: Terri [name], female    DOB: 1985/08/04, 25 y.o.   MRN: [id number]  HPI Pt is here for follow up on BP. Pt recently started on HCTZ with full dose norvasc. Pt has been severly non compliant with these medications. Pt states that she has not been taking these medications because of cost and headache. Pt states that she is tired of having a headache.  Pt also reports general frustration and anger issues. Pt states that she has had to go to anger management in the past. Per pt, she has also been on medication for mood, though pt is not sure of what medication that it was. Pt currently denies and HI/SI. Pt does report insomnia, racing thoughts, recurrent impulsivity. Pt does report intermittent agitation.   Review of Systems See HPI     Objective:   Physical Exam Gen: up in chair, mildly agitated  HEENT: NCAT, EOMI, TMs clear bilaterally, + photophobia with funduscopic exam bilaterally  CV: RRR, no murmurs auscultated PULM: CTAB, no wheezes, rales, rhoncii ABD: S/NT/+ bowel sounds  EXT: 2+ peripheral pulses NEURO: CN II-XII grossly intact, no focal neurological deficits.     Assessment & Plan:

## 2011-03-28 LAB — URINALYSIS, ROUTINE W REFLEX MICROSCOPIC
Bilirubin Urine: NEGATIVE
Ketones, ur: NEGATIVE
Leukocytes, UA: NEGATIVE
Nitrite: NEGATIVE
Protein, ur: NEGATIVE

## 2011-03-28 LAB — CBC
HCT: 36.7
Hemoglobin: 12.5
MCHC: 34
RBC: 4.12
RDW: 13.3

## 2011-03-28 LAB — WET PREP, GENITAL
Clue Cells Wet Prep HPF POC: NONE SEEN
Yeast Wet Prep HPF POC: NONE SEEN

## 2011-04-05 DIAGNOSIS — F39 Unspecified mood [affective] disorder: Secondary | ICD-10-CM | POA: Insufficient documentation

## 2011-04-05 NOTE — Assessment & Plan Note (Signed)
Stress with patient importance of compliance with medications. Discussed with patient that medications can be picked up on the [facility] $4 list. Instructed patient to followup with me in 3-5 days when she has began taking her medication to more adequately assess her blood pressure. Cardiovascular flags discussed at length.

## 2011-04-05 NOTE — Assessment & Plan Note (Addendum)
There is a relatively broad differential for patient's overall affect. There is a likely component of bipolar disorder in patient's overall presentation. Will start patient on Seroquel as this may be the greatest benefit. No HI/SI currently. Would like to set up patient in mood disorder clinic in the near future for full assessment as patient is agreeable. Will followup in 1-2 weeks.

## 2011-04-11 ENCOUNTER — Encounter: Payer: Self-pay | Admitting: Family Medicine

## 2011-04-12 ENCOUNTER — Ambulatory Visit: Payer: Self-pay | Admitting: Family Medicine

## 2011-04-28 ENCOUNTER — Other Ambulatory Visit: Payer: Self-pay | Admitting: Family Medicine

## 2011-04-28 ENCOUNTER — Encounter: Payer: Self-pay | Admitting: Family Medicine

## 2011-04-28 NOTE — Telephone Encounter (Signed)
Terri [name] is calling back because his Rx's are still not at his Pharmacy.

## 2011-04-28 NOTE — Telephone Encounter (Signed)
Needs refill on her Blood Pressure medicine and her mood medicine but she does not know the name of the medications.  She uses [facility] on [address].

## 2011-04-28 NOTE — Telephone Encounter (Signed)
Will fwd. To Dr.Newton for refills .Arlyss Repress

## 2011-04-30 ENCOUNTER — Other Ambulatory Visit: Payer: Self-pay | Admitting: Family Medicine

## 2011-04-30 DIAGNOSIS — F319 Bipolar disorder, unspecified: Secondary | ICD-10-CM

## 2011-04-30 DIAGNOSIS — I10 Essential (primary) hypertension: Secondary | ICD-10-CM

## 2011-04-30 MED ORDER — AMLODIPINE BESYLATE 10 MG PO TABS: 10.0000 mg | ORAL_TABLET | Freq: Every day | ORAL | Status: DC

## 2011-04-30 MED ORDER — HYDROCHLOROTHIAZIDE 25 MG PO TABS: 25.0000 mg | ORAL_TABLET | Freq: Every day | ORAL | Status: DC

## 2011-04-30 MED ORDER — QUETIAPINE FUMARATE 200 MG PO TABS: 200.0000 mg | ORAL_TABLET | Freq: Two times a day (BID) | ORAL | Status: DC

## 2011-04-30 NOTE — Telephone Encounter (Signed)
Meds called in. Attempted to call pt. Both numbers were either invalid or disconnected.

## 2011-05-09 NOTE — Telephone Encounter (Signed)
error 

## 2011-05-13 NOTE — Telephone Encounter (Signed)
This encounter was created in error - please disregard.

## 2011-07-20 ENCOUNTER — Emergency Department (HOSPITAL_COMMUNITY)
Admission: EM | Admit: 2011-07-20 | Discharge: 2011-07-20 | Disposition: A | Discharge status: Home / Self Care | Payer: Medicaid Other | Attending: Emergency Medicine | Admitting: Emergency Medicine

## 2011-07-20 ENCOUNTER — Encounter (HOSPITAL_COMMUNITY): Payer: Self-pay | Admitting: *Deleted

## 2011-07-20 DIAGNOSIS — S61209A Unspecified open wound of unspecified finger without damage to nail, initial encounter: Secondary | ICD-10-CM | POA: Insufficient documentation

## 2011-07-20 DIAGNOSIS — F17200 Nicotine dependence, unspecified, uncomplicated: Secondary | ICD-10-CM | POA: Insufficient documentation

## 2011-07-20 DIAGNOSIS — I10 Essential (primary) hypertension: Secondary | ICD-10-CM | POA: Insufficient documentation

## 2011-07-20 DIAGNOSIS — S61219A Laceration without foreign body of unspecified finger without damage to nail, initial encounter: Secondary | ICD-10-CM

## 2011-07-20 DIAGNOSIS — W268XXA Contact with other sharp object(s), not elsewhere classified, initial encounter: Secondary | ICD-10-CM | POA: Insufficient documentation

## 2011-07-20 HISTORY — DX: Essential (primary) hypertension: I10

## 2011-07-20 MED ORDER — IBUPROFEN 800 MG PO TABS
800.0000 mg | ORAL_TABLET | Freq: Once | ORAL | Status: AC
  Administered 2011-07-20: 800 mg via ORAL
  Filled 2011-07-20: qty 1

## 2011-07-20 MED ORDER — TETANUS-DIPHTH-ACELL PERTUSSIS 5-2.5-18.5 LF-MCG/0.5 IM SUSP
0.5000 mL | Freq: Once | INTRAMUSCULAR | Status: AC
  Administered 2011-07-20: 0.5 mL via INTRAMUSCULAR
  Filled 2011-07-20: qty 0.5

## 2011-07-20 MED ORDER — LIDOCAINE HCL 1 % IJ SOLN
INTRAMUSCULAR | Status: AC
  Filled 2011-07-20: qty 20

## 2011-07-20 MED ORDER — LIDOCAINE HCL (PF) 1 % IJ SOLN
5.0000 mL | Freq: Once | INTRAMUSCULAR | Status: AC
  Administered 2011-07-20: 5 mL via INTRADERMAL
  Filled 2011-07-20: qty 5

## 2011-07-20 NOTE — ED Notes (Signed)
Pt states "cut my finger on a box cutter"; bleeding currently controlled

## 2011-07-20 NOTE — ED Notes (Signed)
Pt cut Left middle finger on a box cutter. Bandage is on and bleeding is controlled.

## 2011-07-21 NOTE — ED Provider Notes (Cosign Needed)
History     CSN: [id number]  Arrival date & time 07/20/11  1630   First MD Initiated Contact with Patient 07/20/11 1904      Chief Complaint  Patient presents with  . Laceration    (Consider location/radiation/quality/duration/timing/severity/associated sxs/prior treatment) HPI  Patient presents to ER complaining of acute onset laceration and pain to left middle finger when she states she was using a box cutter just PTA and slipped cutting the dorsal aspect of left middle finger. Denies decreased ROM but states pain with touch or movement. Has not taken anything for pain PTA. Pain was acute onset, unchanging and persistent. Denies numbness or tingling in finger. Unknown last tetanus. Hemastatic.   Past Medical History  Diagnosis Date  . Hypertention, malignant, with acute intensive management   . Hypertension     History reviewed. No pertinent past surgical history.  No family history on file.  History  Substance Use Topics  . Smoking status: Current Everyday Smoker -- 0.2 packs/day    Types: Cigarettes  . Smokeless tobacco: Not on file  . Alcohol Use: No    OB History    Grav Para Term Preterm Abortions TAB SAB Ect Mult Living                  Review of Systems  All other systems reviewed and are negative.    Allergies  Review of patient's allergies indicates no known allergies.  Home Medications   Current Outpatient Rx  Name Route Sig Dispense Refill  . ALBUTEROL SULFATE HFA 108 (90 BASE) MCG/ACT IN AERS Inhalation Inhale 2 puffs into the lungs every 6 (six) hours as needed. For shortness of breath.    . AMLODIPINE BESYLATE 10 MG PO TABS Oral Take 1 tablet (10 mg total) by mouth daily. 30 tablet 6  . ETONOGESTREL 68 MG [place] IMPL Subcutaneous Inject into the skin. Left arm.  Inserted 12/16/09      . HYDROCHLOROTHIAZIDE 25 MG PO TABS Oral Take 1 tablet (25 mg total) by mouth daily. 30 tablet 6    BP 148/103  Pulse 92  Temp(Src) 98.5 F (36.9 C) (Oral)   Resp 20  Ht 5\' 8"  (1.727 m)  Wt 249 lb 5.4 oz (113.1 kg)  BMI 37.91 kg/m2  SpO2 99%  LMP 07/18/2011  Physical Exam  Constitutional: She is oriented to person, place, and time. She appears well-developed and well-nourished. No distress.  HENT:  Head: Normocephalic and atraumatic.  Eyes: Conjunctivae are normal.  Cardiovascular: Normal rate and regular rhythm.   Pulmonary/Chest: Effort normal.  Musculoskeletal: Normal range of motion. She exhibits tenderness. She exhibits no edema.       Right ankle: She exhibits swelling. tenderness.       2cm linear laceration of dorsal aspect of left middle finger between DIP and PIP joint. Hemastatic. No FB seen or palpated.   5/5 strength. Good cap refill.   Neurological: She is alert and oriented to person, place, and time.       Normal sensation of entire foot.   Skin: Skin is warm and dry. No rash noted. She is not diaphoretic. No erythema. No pallor.       See MSK exam  Psychiatric: She has a normal mood and affect. Her behavior is normal.    ED Course  Procedures (including critical care time)  LACERATION REPAIR Performed by: Jenness Corner Authorized by: Jenness Corner Consent: Verbal consent obtained. Risks and benefits: risks, benefits and alternatives were discussed Consent  given by: patient Patient identity confirmed: provided demographic data Prepped and Draped in normal sterile fashion Wound explored  Laceration Location: left middle finger  Laceration Length: 2cm  No Foreign Bodies seen or palpated  Anesthesia: digit block  Local anesthetic: lidocaine 2% without epinephrin  Anesthetic total: 5 ml  Irrigation method: syringe Amount of cleaning: standard  Skin closure: 5.0 prolene  Number of sutures: 4  Technique: simple interrupted.   Patient tolerance: Patient tolerated the procedure well with no immediate complications.   Labs Reviewed - No data to display No results found.   1. Laceration of finger,  left       MDM  5/5 strength of left middle finger with lac but no tendon involvement. neurovasc intact with good cap refill and sensation.         Jenness Corner, [place] 07/21/11 1514

## 2011-08-05 NOTE — ED Provider Notes (Signed)
Medical screening examination/treatment/procedure(s) were performed by non-physician practitioner and as supervising physician I was immediately available for consultation/collaboration.   Dierks Wach A. Patrica Duel, MD 08/05/11 [phone]

## 2011-08-22 ENCOUNTER — Ambulatory Visit: Payer: Self-pay | Admitting: Family Medicine

## 2011-08-23 ENCOUNTER — Encounter: Payer: Self-pay | Admitting: Family Medicine

## 2011-08-23 ENCOUNTER — Ambulatory Visit (INDEPENDENT_AMBULATORY_CARE_PROVIDER_SITE_OTHER): Payer: Medicaid Other | Admitting: Family Medicine

## 2011-08-23 VITALS — BP 145/95 | HR 82 | Ht 68.0 in | Wt 249.0 lb

## 2011-08-23 DIAGNOSIS — N926 Irregular menstruation, unspecified: Secondary | ICD-10-CM

## 2011-08-23 NOTE — Progress Notes (Signed)
  Subjective:    Patient ID: Terri [name], female    DOB: [birth year], 26 y.o.   MRN: [id number]  HPI Irregular menstrual cycles: Patient states that since she has been on Implanon she has had persistent spotting. Recently has been more frequent and heavier spotting. During the past 2 weeks she's had daily bleeding like a regular period. Patient had Implanon x2 years. Has history of problems with this. Initially was placed on birth control pills after placement Implanon to help get better control of the symptoms. Patient states that birth control pills didn't help regulate her cycle. But she doesn't like being on birth control pills in general. Patient states that she had irregular mental cycles prior to having Implanon inserted. With sometimes skip months and has had heavy and sometimes light periods also had some spotting. But states that the irregular cycles that she had prior were more tolerable than the current ones. Patient request have Implanon removed. She states " I just want to let my body rest."  She states she does not want a pregnancy at this time. She plans to use condoms for birth control.  Patient does have a diagnosis of hypertension but would keep in mind if we start her on another birth control form. Patient also has a history of cigarette smoke. Patient also is obese. does not have a history of DVT.   Marland KitchenReview of Systems    as per above. Objective:   Physical Exam  Constitutional: She appears well-developed and well-nourished.  HENT:  Head: Normocephalic and atraumatic.  Eyes: Pupils are equal, round, and reactive to light.  Neck: Normal range of motion. No thyromegaly present.  Cardiovascular: Normal rate, regular rhythm and normal heart sounds.   No murmur heard. Pulmonary/Chest: Effort normal and breath sounds normal. No respiratory distress. She has no wheezes.  Abdominal: Soft. She exhibits no distension and no mass. There is no tenderness. There is no rebound and no  guarding.  Musculoskeletal: She exhibits no edema.  Neurological: She is alert.  Skin: No rash noted.  Psychiatric: She has a normal mood and affect.          Assessment & Plan:

## 2011-08-23 NOTE — Patient Instructions (Signed)
Make an appointment to get implanon removed as you have requested.  Think about what you want to do for birth control.

## 2011-08-29 DIAGNOSIS — N926 Irregular menstruation, unspecified: Secondary | ICD-10-CM | POA: Insufficient documentation

## 2011-08-29 NOTE — Assessment & Plan Note (Signed)
Has a long history of irregular menstrual cycles.   Percieves this as worse on implanon.  This does appear to be hormonally driven.  Pt would like implanon removed.  Pt to set up appointment for implanon removal.  We discussed other options for birth control. Pt states she plans to use condoms for birth control.  Pt is aware that since periods were irregular prior to using birth control that there is a good chance that they will be irregular still when she goes off of birth control.  Pt to think about birth control options and will let [place] know if she would like other form when she returns.  Also history of HTN- 145/95 at today's appointment and cigarette use.  Need to keep this in mind as we counsel pt on birth control options/ risks/ benefits.

## 2011-08-30 ENCOUNTER — Encounter: Payer: Self-pay | Admitting: Family Medicine

## 2011-08-30 ENCOUNTER — Ambulatory Visit (INDEPENDENT_AMBULATORY_CARE_PROVIDER_SITE_OTHER): Payer: Medicaid Other | Admitting: Family Medicine

## 2011-08-30 VITALS — BP 138/94 | HR 88 | Ht 68.0 in | Wt 248.0 lb

## 2011-08-30 DIAGNOSIS — Z309 Encounter for contraceptive management, unspecified: Secondary | ICD-10-CM

## 2011-08-30 DIAGNOSIS — Z3046 Encounter for surveillance of implantable subdermal contraceptive: Secondary | ICD-10-CM

## 2011-08-31 DIAGNOSIS — Z309 Encounter for contraceptive management, unspecified: Secondary | ICD-10-CM | POA: Insufficient documentation

## 2011-08-31 NOTE — Progress Notes (Signed)
Implanon removal: Risk and benefits discussed with patient.  Answered all of patients questions.  Consent form signed.  Time out performed.  Located the proximal tip of the implant. Positioned and prepped the area. Infused 2 ml of local anesthesia under the proximal tip of the implanon.  After assuring adequate anesthesia,  a shallow 3mm incision vertical to the proximal tip. I gently push the distal end of the implant to advance the proximal tip to the incision. I scraped the fibrous sheath from the capsule tip with gauze or scalpel. When the tip was fully exposed, I gently pulled it with mosquito clamp and removed it.  I approximated the edges of the incision and closed with a Steri-strip. Wrap with gauze pressure dressing.  Pt tolerated well.  Gave pt care instructions and red flags for return to care. Pt stated understanding.

## 2011-09-25 ENCOUNTER — Ambulatory Visit (INDEPENDENT_AMBULATORY_CARE_PROVIDER_SITE_OTHER): Payer: Medicaid Other | Admitting: Family Medicine

## 2011-09-25 ENCOUNTER — Other Ambulatory Visit (HOSPITAL_COMMUNITY)
Admission: RE | Admit: 2011-09-25 | Discharge: 2011-09-25 | Disposition: A | Discharge status: Home / Self Care | Payer: Medicaid Other | Source: Ambulatory Visit | Attending: Family Medicine | Admitting: Family Medicine

## 2011-09-25 ENCOUNTER — Encounter: Payer: Self-pay | Admitting: Family Medicine

## 2011-09-25 VITALS — BP 148/97 | HR 83 | Ht 68.0 in | Wt 241.0 lb

## 2011-09-25 DIAGNOSIS — N644 Mastodynia: Secondary | ICD-10-CM

## 2011-09-25 DIAGNOSIS — Z113 Encounter for screening for infections with a predominantly sexual mode of transmission: Secondary | ICD-10-CM

## 2011-09-25 DIAGNOSIS — I10 Essential (primary) hypertension: Secondary | ICD-10-CM

## 2011-09-25 DIAGNOSIS — N912 Amenorrhea, unspecified: Secondary | ICD-10-CM

## 2011-09-25 LAB — POCT WET PREP (WET MOUNT)

## 2011-09-25 LAB — POCT URINE PREGNANCY: Preg Test, Ur: NEGATIVE

## 2011-09-25 MED ORDER — HYDROCHLOROTHIAZIDE 25 MG PO TABS: 25.0000 mg | ORAL_TABLET | Freq: Every day | ORAL | Status: DC

## 2011-09-25 NOTE — Patient Instructions (Signed)
It was great meeting you today!  For the back pain, like we talked about, exercise will help a great deal. I'm glad your weight is coming down and I'd like to see you back in 1 month or so to see how you are doing.  Also, I am going to give you a food journal to keep so that we can talk about your diet next time.   I will call you if any of the results are abnormal.  Also, I'd like to check your cholesterol. For this, you need to be fasting after midnight. You can make a lab appointment for the morning to get that done.   I will refill your blood pressure medicine and I'll see how it's going next month.

## 2011-09-26 DIAGNOSIS — N644 Mastodynia: Secondary | ICD-10-CM | POA: Insufficient documentation

## 2011-09-26 NOTE — Progress Notes (Signed)
Patient ID: Terri [name], female   DOB: Aug 16, 1985, 26 y.o.   MRN: [id number] Patient ID: Terri [name]    DOB: [birth year], 26 y.o.   MRN: [id number] --- Subjective:  [name] is a 26 y.o.female with h/o hypertension, obesity, asthma who presents for yearly physical and bp medication refill and complaint of pain in left breast. - Left breast pain: tender when she touches it. Has not noticed any lumps or bumps. Feels like the pain has been going on for 3 months. Family History for breast cancer in her maternal great aunt who died at 21yo, but otherwise no other family history of breast or GYN cancers.  She also inquires about possibility of breast reduction as she feels that her breasts are very large and causing significant back pain. She has been fitted for bras and uses extra camis for extra support. The back pain she feels is in her shoulders and mid back.  - Hypertension: lost her medications last month and has therefore not been on any anti-hypertensive therapy since. No chest pain, shortness of breath, lower extremity edema, headache.  - exercise and weight loss: walks twice weekly for .  - STD check up: history of STD's years ago. Currently has one female partner and she doesn't have any particular concerns that he may have infection. Denies unusual discharge, odor. Uses condoms. Had implanon removed and wants to "give her body a rest" for the time being. If she gets pregnant, she would be prepared for it, but has been using condoms for contraception purposes.   Objective: Filed Vitals:   09/25/11 1035  BP: 148/97  Pulse: 83    Physical Examination:   General appearance - alert, well appearing, and in no distress Mouth - mucous membranes moist, pharynx normal without lesions Neck - supple, no significant adenopathy Chest - clear to auscultation, no wheezes, rales or rhonchi, symmetric air entry Heart - normal rate, regular rhythm, normal S1, S2, no murmurs, rubs, clicks or  gallops Abdomen - soft, nontender, nondistended, no masses or organomegaly Extremities - peripheral pulses normal, no pedal edema, no clubbing or cyanosis Breast Exam - large breast, fibrocystic changes felt, no masses, no discharge, normal appearing skin.  Pelvic exam: normal external genitalia, vulva, vagina, cervix, uterus and adnexa, white vaginal discharge.

## 2011-09-26 NOTE — Assessment & Plan Note (Addendum)
BP not controled at 148/97, most likely due to patient non compliance with medication (secondary to losing her medication). Will refill HCTZ 25mg . Will follow up in 1 month. Instructed patient to return for lab work in 2 weeks after having restarted HCTZ. Will check BMP and fasting lipid panel.

## 2011-09-26 NOTE — Assessment & Plan Note (Signed)
Will check for GC/Chl and trichomonas.

## 2011-09-26 NOTE — Assessment & Plan Note (Addendum)
No alarming features on breast exam. No masses or lesions were identified. This is most likely secondary to fibrocystic changes. Recommended ibuprofen use for pain control.  Also, addressing patient's request for breast reduction, I advised patient that weight loss would be the first line of treatment in helping with the back pain. Gave patient food journal to fill out to go over next month. For exercise, she belongs to a gym and states that she will start going more frequently.

## 2011-10-03 ENCOUNTER — Telehealth: Payer: Self-pay | Admitting: Family Medicine

## 2011-10-03 NOTE — Telephone Encounter (Signed)
Called patient with results of wet prep and GC/CHl. Wet prep showed clue cells, but since patient is asymptomatic (no discharge, odor, vaginal burning or itching), will not treat for now. Patient agreed with plan.

## 2011-10-06 ENCOUNTER — Telehealth: Payer: Self-pay | Admitting: Family Medicine

## 2011-10-06 DIAGNOSIS — N760 Acute vaginitis: Secondary | ICD-10-CM | POA: Insufficient documentation

## 2011-10-06 DIAGNOSIS — B9689 Other specified bacterial agents as the cause of diseases classified elsewhere: Secondary | ICD-10-CM | POA: Insufficient documentation

## 2011-10-06 DIAGNOSIS — N6452 Nipple discharge: Secondary | ICD-10-CM

## 2011-10-06 MED ORDER — METRONIDAZOLE 500 MG PO TABS: 500.0000 mg | ORAL_TABLET | Freq: Two times a day (BID) | ORAL | Status: AC

## 2011-10-06 NOTE — Telephone Encounter (Signed)
Called patient to further inquire about complaints of nipple discharge that she had briefly mentioned at her office visit last week. When asked further about it, she mentions that it's in both breasts, milky appearing, no blood, and comes when she expresses it. Had one pregnancy in 2006 and breastfed for 8months. UPT was negative at her last visit. This is consistent with physiologic discharge. There are no red flags that would require imaging for evaluation of malignancy such as bloody discharge, unilateral, serous or clear or associated with a mass.  Will obtain TSH and prolactin to work up galactorrhea. These labs will be obtained with BMP that will be obtained 7-10 days after she starts BP medicine.  Patient also mentioned that she had more discharge and would like a prescription for flagyl since she had BV on wet prep at last visit.

## 2011-10-14 ENCOUNTER — Other Ambulatory Visit: Payer: Self-pay | Admitting: Family Medicine

## 2011-10-14 DIAGNOSIS — I10 Essential (primary) hypertension: Secondary | ICD-10-CM

## 2011-10-14 MED ORDER — HYDROCHLOROTHIAZIDE 25 MG PO TABS: 25.0000 mg | ORAL_TABLET | Freq: Every day | ORAL | Status: DC

## 2011-10-14 NOTE — Progress Notes (Signed)
Pt called to say medication not available in pharmacy when her doctor called it in.  I looked and HCTZ was sent, and to the right pharmacy.  Will resend.  Asked her to wait an hour and then call the pharmacy.

## 2011-11-25 ENCOUNTER — Other Ambulatory Visit: Payer: Self-pay | Admitting: Family Medicine

## 2011-11-25 DIAGNOSIS — I10 Essential (primary) hypertension: Secondary | ICD-10-CM

## 2011-11-25 MED ORDER — HYDROCHLOROTHIAZIDE 25 MG PO TABS: 25.0000 mg | ORAL_TABLET | Freq: Every day | ORAL | Status: DC

## 2011-11-25 NOTE — Progress Notes (Signed)
Patient called to say that she misplaced her BP medications.  States that she feels like her pressure is up and she has mild headache.  Told her I would send in 5 day supply but would need to discuss with PCP for further refills.

## 2011-11-27 ENCOUNTER — Emergency Department (HOSPITAL_COMMUNITY)
Admission: EM | Admit: 2011-11-27 | Discharge: 2011-11-28 | Discharge status: Against Medical Advice | Payer: Medicaid Other | Attending: Emergency Medicine | Admitting: Emergency Medicine

## 2011-11-27 ENCOUNTER — Encounter (HOSPITAL_COMMUNITY): Payer: Self-pay | Admitting: *Deleted

## 2011-11-27 DIAGNOSIS — M545 Low back pain, unspecified: Secondary | ICD-10-CM | POA: Insufficient documentation

## 2011-11-27 NOTE — ED Notes (Signed)
Pt reports sharp shooting lower back pain x2 days - pt denies hx of same. Pt denies any urinary symptoms or heavy lifting recently. Pt tearful on assessment.

## 2011-11-28 LAB — PREGNANCY, URINE: Preg Test, Ur: NEGATIVE

## 2011-11-28 LAB — URINALYSIS, ROUTINE W REFLEX MICROSCOPIC
Bilirubin Urine: NEGATIVE
Leukocytes, UA: NEGATIVE
Nitrite: NEGATIVE
Specific Gravity, Urine: 1.036 — ABNORMAL HIGH (ref 1.005–1.030)
Urobilinogen, UA: 1 mg/dL (ref 0.0–1.0)
pH: 6 (ref 5.0–8.0)

## 2011-11-28 NOTE — ED Notes (Signed)
[id number]<QI> Expected date:<BR> Expected time:<BR> Means of arrival:<BR> Comments:<BR> close

## 2011-11-29 ENCOUNTER — Encounter: Payer: Self-pay | Admitting: Family Medicine

## 2011-11-29 ENCOUNTER — Ambulatory Visit (INDEPENDENT_AMBULATORY_CARE_PROVIDER_SITE_OTHER): Payer: Medicaid Other | Admitting: Family Medicine

## 2011-11-29 VITALS — BP 159/100 | HR 81 | Temp 98.6°F | Ht 68.0 in | Wt 240.0 lb

## 2011-11-29 DIAGNOSIS — M549 Dorsalgia, unspecified: Secondary | ICD-10-CM

## 2011-11-29 MED ORDER — KETOROLAC TROMETHAMINE 60 MG/2ML IM SOLN
60.0000 mg | Freq: Once | INTRAMUSCULAR | Status: AC
  Administered 2011-11-29: 60 mg via INTRAMUSCULAR

## 2011-11-29 MED ORDER — PREDNISONE 10 MG PO TABS: ORAL_TABLET | ORAL | Status: DC

## 2011-11-29 NOTE — Patient Instructions (Addendum)
Today we gave you a shot to help you feel better I would also like you to start a prednisone taper today. You'll take 4 tablets on the first and second day, 3 tablets on the third and fourth, 2 tablets on the fifth and sixth, 1 tablet on the seventh and eighth day You will come back and see me early next week  Back Pain, Adult Low back pain is very common. About 1 in 5 people have back pain. The cause of low back pain is rarely dangerous. The pain often gets better over time. About half of people with a sudden onset of back pain feel better in just 2 weeks. About 8 in 10 people feel better by 6 weeks.   CAUSES Some common causes of back pain include:  Strain of the muscles or ligaments supporting the spine.   Wear and tear (degeneration) of the spinal discs.   Arthritis.   Direct injury to the back.  DIAGNOSIS Most of the time, the direct cause of low back pain is not known. However, back pain can be treated effectively even when the exact cause of the pain is unknown. Answering your caregiver's questions about your overall health and symptoms is one of the most accurate ways to make sure the cause of your pain is not dangerous. If your caregiver needs more information, he or she may order lab work or imaging tests (X-rays or MRIs). However, even if imaging tests show changes in your back, this usually does not require surgery. HOME CARE INSTRUCTIONS For many people, back pain returns. Since low back pain is rarely dangerous, it is often a condition that people can learn to manage on their own.    Remain active. It is stressful on the back to sit or stand in one place. Do not sit, drive, or stand in one place for more than 30 minutes at a time. Take short walks on level surfaces as soon as pain allows. Try to increase the length of time you walk each day.   Do not stay in bed. Resting more than 1 or 2 days can delay your recovery.   Do not avoid exercise or work. Your body is made to move.  It is not dangerous to be active, even though your back may hurt. Your back will likely heal faster if you return to being active before your pain is gone.   Pay attention to your body when you  bend and lift. Many people have less discomfort when lifting if they bend their knees, keep the load close to their bodies, and avoid twisting. Often, the most comfortable positions are those that put less stress on your recovering back.   Find a comfortable position to sleep. Use a firm mattress and lie on your side with your knees slightly bent. If you lie on your back, put a pillow under your knees.   Only take over-the-counter or prescription medicines as directed by your caregiver. Over-the-counter medicines to reduce pain and inflammation are often the most helpful. Your caregiver may prescribe muscle relaxant drugs. These medicines help dull your pain so you can more quickly return to your normal activities and healthy exercise.   Put ice on the injured area.   Put ice in a plastic bag.   Place a towel between your skin and the bag.   Leave the ice on for 15 to 20 minutes, 3 to 4 times a day for the first 2 to 3 days. After that, ice  and heat may be alternated to reduce pain and spasms.   Ask your caregiver about trying back exercises and gentle massage. This may be of some benefit.   Avoid feeling anxious or stressed. Stress increases muscle tension and can worsen back pain. It is important to recognize when you are anxious or stressed and learn ways to manage it. Exercise is a great option.  SEEK MEDICAL CARE IF:  You have pain that is not relieved with rest or medicine.   You have pain that does not improve in 1 week.   You have new symptoms.   You are generally not feeling well.  SEEK IMMEDIATE MEDICAL CARE IF:    You have pain that radiates from your back into your legs.   You develop new bowel or bladder control problems.   You have unusual weakness or numbness in your arms or  legs.   You develop nausea or vomiting.   You develop abdominal pain.   You feel faint.  Document Released: 05/29/2005 Document Revised: 05/18/2011 Document Reviewed: 10/17/2010 [facility] Patient Information 2012 [place], [place].

## 2011-11-29 NOTE — Progress Notes (Signed)
  Subjective:    Patient ID: Terri [name], female    DOB: 27-Oct-1985, 26 y.o.   MRN: [id number]  HPI She presents with 4 days of lower back pain. She does not think that she has any injury. It started in the morning. She has used Tylenol and heat. She says that the pain is mostly gone when she is still but if she moves it is sharp. She says it shoots down the right leg. She denies weakness in her legs. She denies loss of bowel or bladder function. She has not had any fevers. She has not had any rash. She does not have any numbness. She has never had back pain before.   Review of Systems See above    Objective:   Physical Exam GEN-tearful, uncomfortable appearing MSK-tender to palpation along the spine at L4, 5, 6. Positive straight leg raise on the left. Normal strength bilateral lower extremities. 1+ reflexes bilateral lower extremities. Normal sensation to light touch bilaterally       Assessment & Plan:

## 2011-11-29 NOTE — Assessment & Plan Note (Signed)
Acute back pain x4 days with positive straight leg raise. Will treat with Toradol today and start prednisone taper. Gave Flexeril for muscle spasm - Gave symptomatic treatment. See back early next week for recheck

## 2011-12-01 ENCOUNTER — Telehealth: Payer: Self-pay | Admitting: Family Medicine

## 2011-12-01 NOTE — Telephone Encounter (Signed)
Patient is calling because her back pain is continuing and the pain medication isn't working and she doesn't know what she needs to do for it.

## 2011-12-04 NOTE — Telephone Encounter (Signed)
Called pt. Left message. Pt needs appt to be re-evaluated. Terri [name], Terri [name]

## 2011-12-20 ENCOUNTER — Emergency Department (HOSPITAL_COMMUNITY)
Admission: EM | Admit: 2011-12-20 | Discharge: 2011-12-20 | Disposition: A | Discharge status: Home / Self Care | Payer: Medicaid Other | Source: Home / Self Care

## 2011-12-20 ENCOUNTER — Encounter (HOSPITAL_COMMUNITY): Payer: Self-pay | Admitting: *Deleted

## 2011-12-20 DIAGNOSIS — A084 Viral intestinal infection, unspecified: Secondary | ICD-10-CM

## 2011-12-20 DIAGNOSIS — A088 Other specified intestinal infections: Secondary | ICD-10-CM

## 2011-12-20 DIAGNOSIS — N898 Other specified noninflammatory disorders of vagina: Secondary | ICD-10-CM

## 2011-12-20 LAB — POCT URINALYSIS DIP (DEVICE)
Bilirubin Urine: NEGATIVE
Glucose, UA: NEGATIVE mg/dL
Hgb urine dipstick: NEGATIVE
Leukocytes, UA: NEGATIVE
Nitrite: NEGATIVE
Urobilinogen, UA: 2 mg/dL — ABNORMAL HIGH (ref 0.0–1.0)

## 2011-12-20 LAB — POCT PREGNANCY, URINE: Preg Test, Ur: NEGATIVE

## 2011-12-20 MED ORDER — METRONIDAZOLE 500 MG PO TABS: 500.0000 mg | ORAL_TABLET | Freq: Three times a day (TID) | ORAL | Status: AC

## 2011-12-20 MED ORDER — FLUCONAZOLE 150 MG PO TABS: 150.0000 mg | ORAL_TABLET | Freq: Once | ORAL | Status: AC

## 2011-12-20 MED ORDER — ONDANSETRON HCL 4 MG PO TABS: 4.0000 mg | ORAL_TABLET | Freq: Three times a day (TID) | ORAL | Status: AC | PRN

## 2011-12-20 NOTE — ED Provider Notes (Signed)
Medical screening examination/treatment/procedure(s) were performed by a resident physician and as supervising physician I was immediately available for consultation/collaboration.  Additionally, I saw the patient independently, verified the history, examined the patient and discussed the treatment plan with the resident.  Leslee Home, M.D.    Reuben Likes, MD 12/20/11 2115

## 2011-12-20 NOTE — ED Provider Notes (Signed)
History     CSN: [id number]  Arrival date & time 12/20/11  1523   First MD Initiated Contact with Patient 12/20/11 1705      Chief Complaint  Patient presents with  . Nausea   HPI  Patient presents with nausea and vomiting that started this morning at 5:30 AM.  She and her boyfriend ordered [place] food for dinner last night, and she is concerned this could be food poisoning.  The last time she felt this way was when she found out she was pregnant 6 years ago.  Patient is sexually active and does not use condoms - her Implanon was removed a few weeks ago.  She has not had her period yet this month, but says her periods are always irregular.  Patient vomited twice today - last emesis was at noon.  Her nausea is also gradually improving.  Patient complains of associated abdominal cramping after vomiting, but this has improved as well.  Patient also concerned about white, thick vaginal discharge that started 3 days ago.  She would like to get checked for STDS.  She denies associated vaginal bleeding, dysuria, urinary frequency, pelvic or back pain.  She  had one episode of loose stool this morning.  Her appetite is normal.  Past Medical History  Diagnosis Date  . Hypertention, malignant, with acute intensive management   . Hypertension     History reviewed. No pertinent past surgical history.  No family history on file.  History  Substance Use Topics  . Smoking status: Current Everyday Smoker -- 0.2 packs/day    Types: Cigarettes  . Smokeless tobacco: Not on file  . Alcohol Use: No    Review of Systems  Per HPI  Allergies  Review of patient's allergies indicates no known allergies.  Home Medications   Current Outpatient Rx  Name Route Sig Dispense Refill  . FLUCONAZOLE 150 MG PO TABS Oral Take 1 tablet (150 mg total) by mouth once. 1 tablet 0  . HYDROCHLOROTHIAZIDE 25 MG PO TABS Oral Take 1 tablet (25 mg total) by mouth daily. 5 tablet 6  . METRONIDAZOLE 500 MG PO TABS  Oral Take 1 tablet (500 mg total) by mouth 3 (three) times daily. 14 tablet 0  . ONDANSETRON HCL 4 MG PO TABS Oral Take 1 tablet (4 mg total) by mouth every 8 (eight) hours as needed for nausea. 20 tablet 0  . PREDNISONE 10 MG PO TABS  Day 1- 2= 4 tabs, Day 3-4 = 3 tabs, day 5-6 = 2 tabs, day 7-8 = 1 tab 20 tablet 0    BP 151/96  Pulse 87  Temp 98.6 F (37 C) (Oral)  Resp 16  SpO2 96%  LMP 11/25/2011  Physical Exam  Constitutional: She appears well-nourished. No distress.  Cardiovascular: Normal rate and regular rhythm.   No murmur heard. Pulmonary/Chest: Effort normal and breath sounds normal.  Abdominal: Soft. Bowel sounds are normal. She exhibits no distension and no mass. There is no tenderness. There is no rebound and no guarding.  Genitourinary: Uterus normal. There is rash on the right labia. There is rash on the left labia. Cervix exhibits discharge. Cervix exhibits no motion tenderness and no friability. No erythema, tenderness or bleeding around the vagina. Vaginal discharge found.      ED Course  Procedures (including critical care time)  Labs Reviewed  POCT URINALYSIS DIP (DEVICE) - Abnormal; Notable for the following:    Protein, ur 30 (*)     Urobilinogen,  UA 2.0 (*)     All other components within normal limits  POCT PREGNANCY, URINE  GC/CHLAMYDIA PROBE AMP, GENITAL    1. Viral gastroenteritis   2. Vaginal Discharge     MDM   1. Nausea/vomiting: likely secondary to viral gastroenteritis vs. Food poisoning.  Urine pregnancy was negative and UA was normal.  Patient's symptoms seem to be improving.  She did not vomit while in our office.   Gave patient Rx for Zofran.  If she misses her period this month, I advised her to follow up with PCP to repeat urine pregnancy test.  If symptoms return or she develops persistent fevers, diarrhea, or bloody stools, she is to return to ED or see PCP.  2. Vaginal discharge: Likely yeast infection vs. BV.  Will treat with  Diflucan and Flagyl.  GC/Chlamydia pending.  For rash on bilateral thighs, patient to follow up with PCP as needed.           Barnabas Lister, MD 12/20/11 [phone]

## 2011-12-20 NOTE — ED Notes (Signed)
Pt  Reports  Symptoms  Of  Nausea   Vomiting  diarrhea     Since  yest      Also      wanys  Her  Vaginal   Discharge   checked  As  Well       Pt  Appears  In no  Severe  Distress  Walks  Upright  With a  Slow  Steady  Gait

## 2011-12-21 LAB — GC/CHLAMYDIA PROBE AMP, GENITAL: Chlamydia, DNA Probe: NEGATIVE

## 2012-05-22 ENCOUNTER — Encounter (HOSPITAL_COMMUNITY): Payer: Self-pay | Admitting: Emergency Medicine

## 2012-05-22 ENCOUNTER — Emergency Department (HOSPITAL_COMMUNITY)
Admission: EM | Admit: 2012-05-22 | Discharge: 2012-05-22 | Disposition: A | Discharge status: Home / Self Care | Payer: Self-pay | Attending: Emergency Medicine | Admitting: Emergency Medicine

## 2012-05-22 DIAGNOSIS — Z3202 Encounter for pregnancy test, result negative: Secondary | ICD-10-CM | POA: Insufficient documentation

## 2012-05-22 DIAGNOSIS — R300 Dysuria: Secondary | ICD-10-CM | POA: Insufficient documentation

## 2012-05-22 DIAGNOSIS — N390 Urinary tract infection, site not specified: Secondary | ICD-10-CM | POA: Insufficient documentation

## 2012-05-22 DIAGNOSIS — F17200 Nicotine dependence, unspecified, uncomplicated: Secondary | ICD-10-CM | POA: Insufficient documentation

## 2012-05-22 DIAGNOSIS — I10 Essential (primary) hypertension: Secondary | ICD-10-CM | POA: Insufficient documentation

## 2012-05-22 LAB — URINALYSIS, ROUTINE W REFLEX MICROSCOPIC
Bilirubin Urine: NEGATIVE
Nitrite: NEGATIVE
Specific Gravity, Urine: 1.023 (ref 1.005–1.030)
pH: 7 (ref 5.0–8.0)

## 2012-05-22 LAB — URINE MICROSCOPIC-ADD ON

## 2012-05-22 LAB — WET PREP, GENITAL

## 2012-05-22 LAB — POCT PREGNANCY, URINE: Preg Test, Ur: NEGATIVE

## 2012-05-22 MED ORDER — ACETAMINOPHEN 325 MG PO TABS
650.0000 mg | ORAL_TABLET | Freq: Once | ORAL | Status: AC
  Administered 2012-05-22: 650 mg via ORAL
  Filled 2012-05-22: qty 2

## 2012-05-22 MED ORDER — SULFAMETHOXAZOLE-TMP DS 800-160 MG PO TABS
1.0000 | ORAL_TABLET | Freq: Once | ORAL | Status: AC
  Administered 2012-05-22: 1 via ORAL
  Filled 2012-05-22: qty 1

## 2012-05-22 MED ORDER — SULFAMETHOXAZOLE-TRIMETHOPRIM 800-160 MG PO TABS: 1.0000 | ORAL_TABLET | Freq: Two times a day (BID) | ORAL | Status: DC

## 2012-05-22 MED ORDER — ACETAMINOPHEN 500 MG PO TABS: 500.0000 mg | ORAL_TABLET | Freq: Four times a day (QID) | ORAL | Status: DC | PRN

## 2012-05-22 NOTE — ED Notes (Signed)
Pt presenting to ed with c/o "I'm sure that I have a uti" pt states she has had them in the past and it feels the same. Pt states she has urgency, frequency and pressure x 2-3 days

## 2012-05-22 NOTE — ED Provider Notes (Signed)
History     CSN: [id number]  Arrival date & time 05/22/12  4098   First MD Initiated Contact with Patient 05/22/12 2024      Chief Complaint  Patient presents with  . uti symptoms   . Vaginal Discharge    (Consider location/radiation/quality/duration/timing/severity/associated sxs/prior treatment) HPI  26 year old female with history of UTI presents complaining of dysuria. Patient reports for the past 2 days she has had gradual onset of burning urination, urinary frequency, and pressure to her low abdomen for a very similar to prior UTI. Pain is nonradiating,  moderate in severity. Nothing seems to make it better or worse. She also notice a mild white vaginal discharge. She has tried taking Azo, and drinking cranberry juice without relief.  She denies fever, chills, nausea, vomiting, diarrhea, back pain, or rash.  Past Medical History  Diagnosis Date  . Hypertention, malignant, with acute intensive management   . Hypertension     History reviewed. No pertinent past surgical history.  No family history on file.  History  Substance Use Topics  . Smoking status: Current Every Day Smoker -- 0.2 packs/day    Types: Cigarettes  . Smokeless tobacco: Not on file  . Alcohol Use: Yes     Comment: occassionally    OB History    Grav Para Term Preterm Abortions TAB SAB Ect Mult Living                  Review of Systems  Constitutional: Negative for fever.  Gastrointestinal: Negative for nausea, vomiting and diarrhea.  Genitourinary: Positive for dysuria and vaginal discharge. Negative for vaginal bleeding.  Skin: Negative for rash and wound.    Allergies  Review of patient's allergies indicates no known allergies.  Home Medications  No current outpatient prescriptions on file.  BP 145/91  Pulse 88  Temp 99 F (37.2 C) (Oral)  Resp 18  SpO2 100%  LMP 04/08/2012  Physical Exam  Nursing note and vitals reviewed. Constitutional: She is oriented to person, place,  and time. She appears well-developed and well-nourished. No distress.  HENT:  Head: Normocephalic and atraumatic.  Eyes: Conjunctivae normal are normal.  Neck: Normal range of motion. Neck supple.  Cardiovascular: Normal rate and regular rhythm.   Pulmonary/Chest: Effort normal and breath sounds normal. She exhibits no tenderness.  Abdominal: Soft. There is no tenderness.       No CVA tenderness  Genitourinary: Vagina normal and uterus normal. There is no rash or lesion on the right labia. There is no rash or lesion on the left labia. Cervix exhibits no motion tenderness and no discharge. Right adnexum displays no mass and no tenderness. Left adnexum displays no mass and no tenderness. No erythema, tenderness or bleeding around the vagina. No vaginal discharge found.       Chaperone present  Lymphadenopathy:       Right: No inguinal adenopathy present.       Left: No inguinal adenopathy present.  Neurological: She is alert and oriented to person, place, and time.  Skin: Skin is warm. No rash noted.    ED Course  Procedures (including critical care time)  Labs Reviewed  URINALYSIS, ROUTINE W REFLEX MICROSCOPIC - Abnormal; Notable for the following:    APPearance CLOUDY (*)     Hgb urine dipstick MODERATE (*)     Protein, ur 100 (*)     Leukocytes, UA LARGE (*)     All other components within normal limits  URINE MICROSCOPIC-ADD ON -  Abnormal; Notable for the following:    Squamous Epithelial / LPF FEW (*)     Bacteria, UA FEW (*)     All other components within normal limits  POCT PREGNANCY, URINE  URINE CULTURE   No results found.   No diagnosis found.  Results for orders placed during the hospital encounter of 05/22/12  URINALYSIS, ROUTINE W REFLEX MICROSCOPIC      Component Value Range   Color, Urine YELLOW  YELLOW   APPearance CLOUDY (*) CLEAR   Specific Gravity, Urine 1.023  1.005 - 1.030   pH 7.0  5.0 - 8.0   Glucose, UA NEGATIVE  NEGATIVE mg/dL   Hgb urine  dipstick MODERATE (*) NEGATIVE   Bilirubin Urine NEGATIVE  NEGATIVE   Ketones, ur NEGATIVE  NEGATIVE mg/dL   Protein, ur 161 (*) NEGATIVE mg/dL   Urobilinogen, UA 0.2  0.0 - 1.0 mg/dL   Nitrite NEGATIVE  NEGATIVE   Leukocytes, UA LARGE (*) NEGATIVE  POCT PREGNANCY, URINE      Component Value Range   Preg Test, Ur NEGATIVE  NEGATIVE  URINE MICROSCOPIC-ADD ON      Component Value Range   Squamous Epithelial / LPF FEW (*) RARE   WBC, UA 21-50  <3 WBC/hpf   RBC / HPF 11-20  <3 RBC/hpf   Bacteria, UA FEW (*) RARE  WET PREP, GENITAL      Component Value Range   Yeast Wet Prep HPF POC NONE SEEN  NONE SEEN   Trich, Wet Prep NONE SEEN  NONE SEEN   Clue Cells Wet Prep HPF POC FEW (*) NONE SEEN   WBC, Wet Prep HPF POC FEW (*) NONE SEEN   No results found.  1. UTI  MDM  Pt presents with dysuria.  UA remarkable for UTI, will treat with bactrim.  Pelvic examination unremarkable.  Cultures sent.   BP 145/91  Pulse 88  Temp 99 F (37.2 C) (Oral)  Resp 18  SpO2 100%  LMP 04/08/2012  I have reviewed nursing notes and vital signs.   I reviewed available ER/hospitalization records thought the EMR       Fayrene Helper, [place] 05/22/12 2224

## 2012-05-22 NOTE — ED Provider Notes (Signed)
Medical screening examination/treatment/procedure(s) were performed by non-physician practitioner and as supervising physician I was immediately available for consultation/collaboration. Devoria Albe, MD, Armando Gang   Ward Givens, MD 05/22/12 2228

## 2012-05-23 LAB — GC/CHLAMYDIA PROBE AMP
CT Probe RNA: NEGATIVE
GC Probe RNA: NEGATIVE

## 2012-05-25 LAB — URINE CULTURE: Colony Count: >=100000

## 2012-05-26 NOTE — ED Notes (Signed)
+  Urine. Patient given Septra DS. No sensitivity listed. Chart sent to EDP office for review.

## 2012-06-01 ENCOUNTER — Telehealth (HOSPITAL_COMMUNITY): Payer: Self-pay | Admitting: Emergency Medicine

## 2012-06-01 NOTE — ED Notes (Signed)
Chart returned from EDP office. Per Roxy Horseman PA-C, continue as prescribed. Return or follow-up with PCP for worsening symptoms or fever >102.

## 2012-06-02 NOTE — ED Notes (Signed)
Unable to contact patient via phone. Sent letter. °

## 2012-06-02 NOTE — ED Notes (Signed)
Patient treated per protocol. Chart sent to Medical Records. °

## 2012-10-14 ENCOUNTER — Encounter (HOSPITAL_COMMUNITY): Payer: Self-pay | Admitting: *Deleted

## 2012-10-14 ENCOUNTER — Emergency Department (HOSPITAL_COMMUNITY)
Admission: EM | Admit: 2012-10-14 | Discharge: 2012-10-14 | Disposition: A | Discharge status: Home / Self Care | Payer: Medicaid Other | Attending: Emergency Medicine | Admitting: Emergency Medicine

## 2012-10-14 ENCOUNTER — Emergency Department (HOSPITAL_COMMUNITY): Payer: Medicaid Other

## 2012-10-14 DIAGNOSIS — I10 Essential (primary) hypertension: Secondary | ICD-10-CM | POA: Insufficient documentation

## 2012-10-14 DIAGNOSIS — K5900 Constipation, unspecified: Secondary | ICD-10-CM | POA: Insufficient documentation

## 2012-10-14 DIAGNOSIS — R143 Flatulence: Secondary | ICD-10-CM | POA: Insufficient documentation

## 2012-10-14 DIAGNOSIS — R141 Gas pain: Secondary | ICD-10-CM | POA: Insufficient documentation

## 2012-10-14 DIAGNOSIS — F17200 Nicotine dependence, unspecified, uncomplicated: Secondary | ICD-10-CM | POA: Insufficient documentation

## 2012-10-14 DIAGNOSIS — R109 Unspecified abdominal pain: Secondary | ICD-10-CM | POA: Insufficient documentation

## 2012-10-14 DIAGNOSIS — Z79899 Other long term (current) drug therapy: Secondary | ICD-10-CM | POA: Insufficient documentation

## 2012-10-14 DIAGNOSIS — R142 Eructation: Secondary | ICD-10-CM | POA: Insufficient documentation

## 2012-10-14 LAB — BASIC METABOLIC PANEL
BUN: 9 mg/dL (ref 6–23)
Calcium: 9.1 mg/dL (ref 8.4–10.5)
Creatinine, Ser: 0.8 mg/dL (ref 0.50–1.10)
GFR calc Af Amer: >90 mL/min (ref >90)
GFR calc non Af Amer: >90 mL/min (ref >90)
Glucose, Bld: 101 mg/dL — ABNORMAL HIGH (ref 70–99)

## 2012-10-14 LAB — HEPATIC FUNCTION PANEL
ALT: 10 U/L (ref 0–35)
AST: 13 U/L (ref 0–37)
Albumin: 3.5 g/dL (ref 3.5–5.2)
Alkaline Phosphatase: 74 U/L (ref 39–117)
Bilirubin, Direct: <0.1 mg/dL (ref 0.0–0.3)
Total Bilirubin: 0.2 mg/dL — ABNORMAL LOW (ref 0.3–1.2)
Total Protein: 6.7 g/dL (ref 6.0–8.3)

## 2012-10-14 LAB — LIPASE, BLOOD: Lipase: 20 U/L (ref 11–59)

## 2012-10-14 LAB — CBC WITH DIFFERENTIAL/PLATELET
Basophils Relative: 0 % (ref 0–1)
Eosinophils Absolute: 0.1 10*3/uL (ref 0.0–0.7)
Eosinophils Relative: 3 % (ref 0–5)
Hemoglobin: 12.4 g/dL (ref 12.0–15.0)
Lymphs Abs: 2.4 10*3/uL (ref 0.7–4.0)
MCH: 29.8 pg (ref 26.0–34.0)
MCHC: 33.3 g/dL (ref 30.0–36.0)
MCV: 89.4 fL (ref 78.0–100.0)
Monocytes Absolute: 0.3 10*3/uL (ref 0.1–1.0)
Monocytes Relative: 7 % (ref 3–12)
Neutrophils Relative %: 40 % — ABNORMAL LOW (ref 43–77)
RBC: 4.16 MIL/uL (ref 3.87–5.11)

## 2012-10-14 MED ORDER — MORPHINE SULFATE 4 MG/ML IJ SOLN: 4.0000 mg | Freq: Once | INTRAMUSCULAR | Status: DC

## 2012-10-14 MED ORDER — MAGNESIUM CITRATE PO SOLN: 296.0000 mL | Freq: Once | ORAL | Status: DC

## 2012-10-14 MED ORDER — BISACODYL 5 MG PO TBEC: 5.0000 mg | DELAYED_RELEASE_TABLET | Freq: Two times a day (BID) | ORAL | Status: DC

## 2012-10-14 MED ORDER — IBUPROFEN 800 MG PO TABS
800.0000 mg | ORAL_TABLET | Freq: Once | ORAL | Status: AC
  Administered 2012-10-14: 800 mg via ORAL
  Filled 2012-10-14: qty 1

## 2012-10-14 NOTE — ED Notes (Signed)
Pt cannot urinate.  Will try again in 30 minutes. 

## 2012-10-14 NOTE — ED Provider Notes (Signed)
Medical screening examination/treatment/procedure(s) were performed by non-physician practitioner and as supervising physician I was immediately available for consultation/collaboration.  Ethelda Chick, MD 10/14/12 2040

## 2012-10-14 NOTE — ED Notes (Signed)
Pt states she has not had a BM in 2 weeks, states has been eating fiber bars, drinking fiber drinks, and taking suppositories but not working, states having abdominal pain d/t constipation.

## 2012-10-14 NOTE — ED Notes (Signed)
Pt stated that she is not able to urinate.  Will try in 30 minutes.

## 2012-10-14 NOTE — ED Provider Notes (Signed)
History     CSN: [id number]  Arrival date & time 10/14/12  1544   First MD Initiated Contact with Patient 10/14/12 1842      Chief Complaint  Patient presents with  . Constipation  . Abdominal Pain    (Consider location/radiation/quality/duration/timing/severity/associated sxs/prior treatment) Patient is a 27 y.o. female presenting with constipation and abdominal pain. The history is provided by the patient. No language interpreter was used.  Constipation  Associated symptoms include abdominal pain. Pertinent negatives include no fever, no nausea, no vomiting and no chest pain.  Abdominal Pain Associated symptoms: constipation   Associated symptoms: no chest pain, no chills, no dysuria, no fever, no nausea and no vomiting   Pt is a 27yo female c/o constipation and abdominal pain x2 weeks.  States pain is constant, waxing and waning 9/10 at worst 4/10 at best.  Pt feels bloated and full. Nothing makes it better or worse.  Has tried laxitives, enemas, and prune juice w/o relief.  Has continued to eat and drink thinking it would help with BM but no relief. Denies hx of abdominal surgeries. Denies urinary symptoms.  Denies fever, chest pain, or back pain.  Denies hx of alcohol abuse or high cholesterol.   Past Medical History  Diagnosis Date  . Hypertention, malignant, with acute intensive management   . Hypertension     History reviewed. No pertinent past surgical history.  No family history on file.  History  Substance Use Topics  . Smoking status: Current Every Day Smoker -- 0.20 packs/day    Types: Cigarettes  . Smokeless tobacco: Never Used  . Alcohol Use: Yes     Comment: occassionally    OB History   Grav Para Term Preterm Abortions TAB SAB Ect Mult Living                  Review of Systems  Constitutional: Negative for fever and chills.  Cardiovascular: Negative for chest pain.  Gastrointestinal: Positive for abdominal pain and constipation. Negative for nausea  and vomiting.  Genitourinary: Negative for dysuria.  All other systems reviewed and are negative.    Allergies  Review of patient's allergies indicates no known allergies.  Home Medications   Current Outpatient Rx  Name  Route  Sig  Dispense  Refill  . amLODipine (NORVASC) 10 MG tablet   Oral   Take 10 mg by mouth daily.         . hydrochlorothiazide (HYDRODIURIL) 25 MG tablet   Oral   Take 25 mg by mouth daily.         . bisacodyl (DULCOLAX) 5 MG EC tablet   Oral   Take 1 tablet (5 mg total) by mouth 2 (two) times daily.   14 tablet   0   . magnesium citrate solution   Oral   Take 296 mLs by mouth once. OTC   300 mL   0   . sulfamethoxazole-trimethoprim (SEPTRA DS) 800-160 MG per tablet   Oral   Take 1 tablet by mouth 2 (two) times daily.   10 tablet   0     BP 140/85  Pulse 87  Temp(Src) 98.7 F (37.1 C) (Oral)  Resp 18  Ht 5\' 8"  (1.727 m)  SpO2 95%  LMP 10/13/2012  Physical Exam  Nursing note and vitals reviewed. Constitutional: She appears well-developed and well-nourished. No distress.  Morbidly obese female lying flat on exam bed.   HENT:  Head: Normocephalic and atraumatic.  Eyes: Conjunctivae  are normal. No scleral icterus.  Neck: Normal range of motion.  Cardiovascular: Normal rate, regular rhythm and normal heart sounds.   Pulmonary/Chest: Effort normal and breath sounds normal. No respiratory distress. She has no wheezes. She has no rales. She exhibits no tenderness.  Abdominal: Soft. Bowel sounds are normal. She exhibits distension ( per pt). She exhibits no mass. There is tenderness ( diffuse, greater in RUQ RLQ). There is no rebound and no guarding.  Musculoskeletal: Normal range of motion.  Neurological: She is alert.  Skin: Skin is warm and dry. She is not diaphoretic.  Psychiatric: She has a normal mood and affect. Her behavior is normal.    ED Course  Procedures (including critical care time)  Labs Reviewed  CBC WITH  DIFFERENTIAL - Abnormal; Notable for the following:    Neutrophils Relative 40 (*)    Lymphocytes Relative 50 (*)    All other components within normal limits  BASIC METABOLIC PANEL - Abnormal; Notable for the following:    Glucose, Bld 101 (*)    All other components within normal limits  HEPATIC FUNCTION PANEL - Abnormal; Notable for the following:    Total Bilirubin 0.2 (*)    All other components within normal limits  LIPASE, BLOOD  URINALYSIS, ROUTINE W REFLEX MICROSCOPIC  PREGNANCY, URINE   Dg Abd Acute W/chest  10/14/2012  *RADIOLOGY REPORT*  Clinical Data: Abdominal pain.  Constipation.  ACUTE ABDOMEN SERIES (ABDOMEN 2 VIEW & CHEST 1 VIEW)  Comparison: PA and lateral chest 05/03/2010.  Findings: Single view of the chest demonstrates clear lungs.  No pneumothorax or pleural effusion.  Heart size is upper normal.  Two views of the abdomen show a normal bowel gas pattern.  No free intraperitoneal air is identified.  No abnormal abdominal calcification is seen.  No focal bony abnormality.  IMPRESSION: Negative exam.   Original Report Authenticated By: Holley Dexter, M.D.      1. Constipation   2. Abdominal pain       MDM  Constipation x2wks. Sharp abdominal pain. Will tx pain and obtain labs and acute abd w/ chest.  Labs are unremarkable. Acute abd: neg exam.  Not concerned for SBO.  Will discharge pt home with mag citrate for constipation.  F/u with pcp.      Rx: Bisacodyl and magnesium citrate   Vitals: unremarkable. Discharged in stable condition.    Discussed pt with attending during ED encounter.       Junius Finner, PA-C 10/14/12 2031

## 2013-02-11 ENCOUNTER — Ambulatory Visit (INDEPENDENT_AMBULATORY_CARE_PROVIDER_SITE_OTHER): Payer: Medicaid Other | Admitting: Family Medicine

## 2013-02-11 ENCOUNTER — Telehealth: Payer: Self-pay | Admitting: Family Medicine

## 2013-02-11 VITALS — BP 143/89 | HR 98 | Temp 98.4°F | Wt 241.0 lb

## 2013-02-11 DIAGNOSIS — K5900 Constipation, unspecified: Secondary | ICD-10-CM

## 2013-02-11 DIAGNOSIS — R109 Unspecified abdominal pain: Secondary | ICD-10-CM

## 2013-02-11 MED ORDER — POLYETHYLENE GLYCOL 3350 17 GM/SCOOP PO POWD: ORAL | Status: DC

## 2013-02-11 MED ORDER — BISACODYL 5 MG PO TBEC: 5.0000 mg | DELAYED_RELEASE_TABLET | Freq: Every day | ORAL | Status: DC | PRN

## 2013-02-11 NOTE — Telephone Encounter (Signed)
Patient called emergency line this morning. Patient reports extreme abdominal  pain that started last night. She has not been able to tolerate PO and has vomited per her report. She denies fever, headache, diarrhea or skin rash.  She also has noticed on Saturday morning she had an insect bite, that turned into a knot on her leg. She states she has been placing warm compress on her bite and there is no drainage, but it is red and has started to swell. She asks if her symptoms are from her insect bite. I advised patient if she is in "extreme" abdominal pain then she needs to be seen in the ED. I can not say without examination, if her symptoms are from a insect bite or infection of insect bite or something else all together. She then asks what this telephone call is for then? 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 I then advised her if she felt her pain could wait to be worked in then we could schedule her an appointment in our clinic today, but if her abdominal pain is that severe then she should be seen in ED. She stated she would call the office and be worked into the schedule this morning.  Zarinah Oviatt DO

## 2013-02-11 NOTE — Assessment & Plan Note (Addendum)
Believe constipation is cause for abdominal pain as only very small bowel movement in last 3 weeks when normally has normal to large volume bowel movement every other week. No other red flags and reported pain out of proportion to exam. Start with miralax and titrate up to 4 capfuls BID by Friday. Bisacodyl as well nightly. Follow up on Friday if no bowel movement and encouraged to see PCP for chronic management. Patient refused rectal exam, need for antiemetics. Work note given.

## 2013-02-11 NOTE — Patient Instructions (Signed)
See the printed prescription for medication instructions. See [place] by Friday if no bowel movement. Schedule the next appointment with Dr. Gwenlyn Saran so you can discuss chronic management of this and decide if you need to see a stomach doctor.   Thanks, Dr. Durene Cal

## 2013-02-11 NOTE — Progress Notes (Addendum)
  [facility] Tana Conch, MD Phone: [phone]  Subjective:  Chief complaint-noted  # Abdominal Pain/history constipation Patient with history of constipation dating back until at least May when she was seen in ED. Has been taking magnesium citrate intermittently sicne that time but never picked up bisacodyl. States has bowel movement every 2 weeks. Most recent bowel movement 1 week ago but very very small. Hard and condensed. Continues to pass gas. She feels bloated and like her belly is full. Has been able to keep down food and fluids for the most part (except this AM). No recent weight loss.  Refuses enemas or rectal exam. So in last 3 weeks she has had 1 very small BM and her pain has continued to worsen over last week typical of pain when she gets constipated. Rates pain as severe and typical of time in May when she went to ED. Small amount of nausea and small nonbloody nonbilious emesis this Am but otherwise without emesis (this happened to her previously in may as well). No melena or hematochezia. No fever/chills.   ROS--See HPI  Past Medical History-hypertension, tobacco abuse (encouraged cessation and gave card for 1800 quit now), asthma Reviewed problem list.  Medications- reviewed and updated Current Outpatient Prescriptions on File Prior to Visit  Medication Sig Dispense Refill  . amLODipine (NORVASC) 10 MG tablet Take 10 mg by mouth daily.      . hydrochlorothiazide (HYDRODIURIL) 25 MG tablet Take 25 mg by mouth daily.      . magnesium citrate solution Take 296 mLs by mouth once. OTC  300 mL  0   No current facility-administered medications on file prior to visit.    Objective: BP 143/89  Pulse 98  Temp(Src) 98.4 F (36.9 C) (Oral)  Wt 241 lb (109.317 kg)  BMI 36.65 kg/m2  LMP 01/11/2013 Gen: NAD, resting comfortably in chair, some pain with movement to table CV: RRR no murmurs rubs or gallops Lungs: CTAB no crackles, wheeze, rhonchi Abd:  soft/no masses/nondistended. Patient with mild diffuse tenderness, worse below umbilicus Skin: warm, dry Neuro: grossly normal, moves all extremities Ext: small <1cm erythematous nodule with central area that appears to have had discharge recently  Assessment/Plan:  Other-encouraged cessation from tobacco products, and noted hypertension but patient acutely in pain Spider bite?-looks like small bug bite on leg that is healing well. Possibly just mosquito. Shrinking in size. No treatment needed.

## 2013-02-21 ENCOUNTER — Ambulatory Visit: Payer: Medicaid Other | Admitting: Family Medicine

## 2013-03-20 ENCOUNTER — Emergency Department (HOSPITAL_COMMUNITY)
Admission: EM | Admit: 2013-03-20 | Discharge: 2013-03-20 | Disposition: A | Discharge status: Home / Self Care | Payer: Medicaid Other | Attending: Emergency Medicine | Admitting: Emergency Medicine

## 2013-03-20 ENCOUNTER — Emergency Department (HOSPITAL_COMMUNITY): Payer: Medicaid Other

## 2013-03-20 ENCOUNTER — Encounter (HOSPITAL_COMMUNITY): Payer: Self-pay | Admitting: Emergency Medicine

## 2013-03-20 DIAGNOSIS — Y990 Civilian activity done for income or pay: Secondary | ICD-10-CM | POA: Insufficient documentation

## 2013-03-20 DIAGNOSIS — M5432 Sciatica, left side: Secondary | ICD-10-CM

## 2013-03-20 DIAGNOSIS — Y9269 Other specified industrial and construction area as the place of occurrence of the external cause: Secondary | ICD-10-CM | POA: Insufficient documentation

## 2013-03-20 DIAGNOSIS — W010XXA Fall on same level from slipping, tripping and stumbling without subsequent striking against object, initial encounter: Secondary | ICD-10-CM | POA: Insufficient documentation

## 2013-03-20 DIAGNOSIS — IMO0001 Reserved for inherently not codable concepts without codable children: Secondary | ICD-10-CM | POA: Insufficient documentation

## 2013-03-20 DIAGNOSIS — F17200 Nicotine dependence, unspecified, uncomplicated: Secondary | ICD-10-CM | POA: Insufficient documentation

## 2013-03-20 DIAGNOSIS — M5430 Sciatica, unspecified side: Secondary | ICD-10-CM | POA: Insufficient documentation

## 2013-03-20 DIAGNOSIS — Y939 Activity, unspecified: Secondary | ICD-10-CM | POA: Insufficient documentation

## 2013-03-20 DIAGNOSIS — S79919A Unspecified injury of unspecified hip, initial encounter: Secondary | ICD-10-CM | POA: Insufficient documentation

## 2013-03-20 DIAGNOSIS — M25551 Pain in right hip: Secondary | ICD-10-CM

## 2013-03-20 DIAGNOSIS — I10 Essential (primary) hypertension: Secondary | ICD-10-CM | POA: Insufficient documentation

## 2013-03-20 DIAGNOSIS — Z79899 Other long term (current) drug therapy: Secondary | ICD-10-CM | POA: Insufficient documentation

## 2013-03-20 MED ORDER — KETOROLAC TROMETHAMINE 60 MG/2ML IM SOLN
60.0000 mg | Freq: Once | INTRAMUSCULAR | Status: AC
  Administered 2013-03-20: 60 mg via INTRAMUSCULAR
  Filled 2013-03-20: qty 2

## 2013-03-20 NOTE — ED Notes (Signed)
Per pt, was at work, [employer], and slipped on wet floor in the bathroom. Pt complaint of right sided hip pain. Pt denies numbness/tingling at present time. Pt states pain is "throbbing." Pt reports hx of left sided sciatica.

## 2013-03-20 NOTE — ED Provider Notes (Signed)
CSN: [id number]     Arrival date & time 03/20/13  1359 History   First MD Initiated Contact with Patient 03/20/13 1420     Chief Complaint  Patient presents with  . Hip Pain   (Consider location/radiation/quality/duration/timing/severity/associated sxs/prior Treatment) HPI Pt is a 27yo female with hx of left sided sciatica and herniated discs c/o right sided hip pain after slipping on wet floor in bathroom at work earlier today.  Pt was in so much pain, employer allowed her to leave work to have it looked at.  Pt drove herself to ER.  Is able to ambulate but severe pain on right side.  Pain is constant, throbbing, 10/10 worse with ambulation and palpation.  Denies taking pain medication PTA. Denies previous injury to right side. Denies numbness or tingling in leg or groin. Denies loss of bowel or bladder. Denies hitting head or LOC.  Denies hx of bone disorder or easy fractures.  Past Medical History  Diagnosis Date  . Hypertention, malignant, with acute intensive management   . Hypertension    History reviewed. No pertinent past surgical history. History reviewed. No pertinent family history. History  Substance Use Topics  . Smoking status: Current Every Day Smoker -- 0.20 packs/day    Types: Cigarettes  . Smokeless tobacco: Never Used  . Alcohol Use: Yes     Comment: occassionally   OB History   Grav Para Term Preterm Abortions TAB SAB Ect Mult Living                 Review of Systems  Musculoskeletal: Positive for arthralgias, back pain and myalgias. Negative for gait problem, joint swelling, neck pain and neck stiffness.  Skin: Negative for wound.  Neurological: Negative for dizziness, syncope, weakness, light-headedness, numbness and headaches.  All other systems reviewed and are negative.    Allergies  Review of patient's allergies indicates no known allergies.  Home Medications   Current Outpatient Rx  Name  Route  Sig  Dispense  Refill  . hydrochlorothiazide  (HYDRODIURIL) 25 MG tablet   Oral   Take 25 mg by mouth daily.         . magnesium citrate SOLN   Oral   Take 0.5 Bottles by mouth daily as needed (constipation).         . bisacodyl (DULCOLAX) 5 MG EC tablet   Oral   Take 1 tablet (5 mg total) by mouth daily as needed for constipation (until you have a bowel movement).   14 tablet   0   . gabapentin (NEURONTIN) 300 MG capsule   Oral   Take 300 mg by mouth 3 (three) times daily.         . polyethylene glycol powder (GLYCOLAX/MIRALAX) powder      Take 17g 2x today. Take 17g  On 9/3 (2 caps in AM and 2 in PM) if no bowel movement on 9/2. Take 17 g on 9/4(3 caps in AM and 3 in pm) if no bowel movement on 9/3. Take 17 g on 9/5 (4 caps in AM and 4 in PM) if no bowel movmeent on 9/4. Come see [place] on Friday if no bowel movement by noon.   3350 g   1    BP 163/100  Pulse 89  Temp(Src) 98.5 F (36.9 C) (Oral)  Resp 18  Ht 5\' 8"  (1.727 m)  Wt 240 lb (108.863 kg)  BMI 36.5 kg/m2  SpO2 100%  LMP 03/20/2013 Physical Exam  Nursing note and  vitals reviewed. Constitutional: She is oriented to person, place, and time. She appears well-developed and well-nourished. No distress.  Pt lying flat in exam bed, NAD.  HENT:  Head: Normocephalic and atraumatic.  Eyes: Conjunctivae are normal. No scleral icterus.  Neck: Normal range of motion. Neck supple.  No midline bone tenderness, no crepitus or step-offs.    Cardiovascular: Normal rate, regular rhythm and normal heart sounds.   Pulmonary/Chest: Effort normal and breath sounds normal. No respiratory distress. She has no wheezes. She has no rales. She exhibits no tenderness.  Abdominal: Soft. Bowel sounds are normal. She exhibits no distension and no mass. There is no tenderness. There is no rebound and no guarding.  Musculoskeletal: Normal range of motion. She exhibits tenderness. She exhibits no edema.  TTP right buttock and hip, to middle of lateral thigh. No deformity. FROM. Antalgic  gait.  Neurological: She is alert and oriented to person, place, and time. No cranial nerve deficit.  Skin: Skin is warm and dry. She is not diaphoretic. No erythema.  No erythema or ecchymosis. Skin in tact.    ED Course  Procedures (including critical care time) Labs Review Labs Reviewed - No data to display Imaging Review Dg Hip Complete Right  03/20/2013   CLINICAL DATA:  Fall, right hip pain  EXAM: RIGHT HIP - COMPLETE 2+ VIEW  COMPARISON:  None.  FINDINGS: No fracture or dislocation is seen.  The joint spaces are preserved.  Visualized bony pelvis appears intact.  IMPRESSION: No fracture or dislocation is seen.   Electronically Signed   By: Charline Bills M.D.   On: 03/20/2013 15:05    EKG Interpretation   None       MDM   1. Right hip pain   2. Sciatica neuralgia, left    Low concern for hip fracture, however due to pain with ambulation, will get plain film right hip to ensure no displacement.  Tx: toradol.    Plain film: no fracture or dislocation seen.    Discussed use of ice on right hip today and transition to heat therapy tomorrow. May use OTC acetaminophen and ibuprofen as needed for pain.  Pt states she has "nerve pill" prescription from urgent care she still needs to fill for left sciatic pain.  Advised pt she still may use that medication.    All labs/imaging/findings discussed with patient. All questions answered and concerns addressed. Will discharge pt home and have pt f/u with [facility] info provided. Return precautions given. Pt verbalized understanding and agreement with tx plan.   Junius Finner, PA-C 03/20/13 1515

## 2013-03-20 NOTE — ED Provider Notes (Signed)
Medical screening examination/treatment/procedure(s) were performed by non-physician practitioner and as supervising physician I was immediately available for consultation/collaboration.   Dagmar Hait, MD 03/20/13 [phone]

## 2013-03-22 ENCOUNTER — Emergency Department (HOSPITAL_COMMUNITY)
Admission: EM | Admit: 2013-03-22 | Discharge: 2013-03-22 | Disposition: A | Discharge status: Home / Self Care | Payer: Medicaid Other | Attending: Emergency Medicine | Admitting: Emergency Medicine

## 2013-03-22 ENCOUNTER — Encounter (HOSPITAL_COMMUNITY): Payer: Self-pay | Admitting: Emergency Medicine

## 2013-03-22 DIAGNOSIS — M25559 Pain in unspecified hip: Secondary | ICD-10-CM | POA: Insufficient documentation

## 2013-03-22 DIAGNOSIS — S01309A Unspecified open wound of unspecified ear, initial encounter: Secondary | ICD-10-CM | POA: Insufficient documentation

## 2013-03-22 DIAGNOSIS — S01311A Laceration without foreign body of right ear, initial encounter: Secondary | ICD-10-CM

## 2013-03-22 DIAGNOSIS — M25551 Pain in right hip: Secondary | ICD-10-CM

## 2013-03-22 DIAGNOSIS — I10 Essential (primary) hypertension: Secondary | ICD-10-CM | POA: Insufficient documentation

## 2013-03-22 DIAGNOSIS — Y939 Activity, unspecified: Secondary | ICD-10-CM | POA: Insufficient documentation

## 2013-03-22 DIAGNOSIS — Z79899 Other long term (current) drug therapy: Secondary | ICD-10-CM | POA: Insufficient documentation

## 2013-03-22 DIAGNOSIS — Y929 Unspecified place or not applicable: Secondary | ICD-10-CM | POA: Insufficient documentation

## 2013-03-22 DIAGNOSIS — G8911 Acute pain due to trauma: Secondary | ICD-10-CM | POA: Insufficient documentation

## 2013-03-22 DIAGNOSIS — F17200 Nicotine dependence, unspecified, uncomplicated: Secondary | ICD-10-CM | POA: Insufficient documentation

## 2013-03-22 DIAGNOSIS — X58XXXA Exposure to other specified factors, initial encounter: Secondary | ICD-10-CM | POA: Insufficient documentation

## 2013-03-22 MED ORDER — KETOROLAC TROMETHAMINE 60 MG/2ML IM SOLN
60.0000 mg | Freq: Once | INTRAMUSCULAR | Status: AC
  Administered 2013-03-22: 60 mg via INTRAMUSCULAR
  Filled 2013-03-22: qty 2

## 2013-03-22 MED ORDER — HYDROCODONE-ACETAMINOPHEN 5-325 MG PO TABS: 1.0000 | ORAL_TABLET | Freq: Four times a day (QID) | ORAL | Status: DC | PRN

## 2013-03-22 NOTE — ED Provider Notes (Signed)
CSN: [id number]     Arrival date & time 03/22/13  1732 History  This chart was scribed for non-physician practitioner, Dierdre Forth, PA-C,working with Vida Roller, MD, by Karle Plumber, ED Scribe.  This patient was seen in room WTR5/WTR5 and the patient's care was started at 7:30 PM.  Chief Complaint  Patient presents with  . Hip Pain   The history is provided by the patient. No language interpreter was used.   HPI Comments:  Terri [name] is a 27 y.o. female with h/o sciatica who presents to the Emergency Department complaining of falling on her hip onset Thursday. Pt states she was seen here Thursday and has been taking Motrin 800mg  with mild relief. She reports that the pain has improved since Thursday, but is still having some pain and this is unsatisfactory to her. She states the hip was bruised when the injury happened and has since improved. She denies all other pertinent symptoms.  Patient reports that she relates that difficulty in the palpation of the joint makes it worse.  Pt also states that her right earring was pulled all the way out of her ear onset yesterday. She reports applying Neosporin and Vaseline to the wound. The wound is not actively bleeding and has closed almost completely.  She requests that we close the wound.  Past Medical History  Diagnosis Date  . Hypertention, malignant, with acute intensive management   . Hypertension    History reviewed. No pertinent past surgical history. No family history on file. History  Substance Use Topics  . Smoking status: Current Every Day Smoker -- 0.20 packs/day    Types: Cigarettes  . Smokeless tobacco: Never Used  . Alcohol Use: Yes     Comment: occassionally   OB History   Grav Para Term Preterm Abortions TAB SAB Ect Mult Living                 Review of Systems  Constitutional: Negative for fever.  HENT: Positive for ear pain (right lobe ).   Gastrointestinal: Negative for nausea and vomiting.   Musculoskeletal: Positive for myalgias.       Right hip pain.  Skin: Positive for wound (right ear lobe ).  Allergic/Immunologic: Negative for immunocompromised state.  Neurological: Negative for weakness and numbness.  Hematological: Does not bruise/bleed easily.  Psychiatric/Behavioral: The patient is not nervous/anxious.   All other systems reviewed and are negative.    Allergies  Review of patient's allergies indicates no known allergies.  Home Medications   Current Outpatient Rx  Name  Route  Sig  Dispense  Refill  . bisacodyl (DULCOLAX) 5 MG EC tablet   Oral   Take 1 tablet (5 mg total) by mouth daily as needed for constipation (until you have a bowel movement).   14 tablet   0   . hydrochlorothiazide (HYDRODIURIL) 25 MG tablet   Oral   Take 25 mg by mouth daily.         [name] ibuprofen (ADVIL,MOTRIN) 200 MG tablet   Oral   Take 200 mg by mouth every 6 (six) hours as needed for pain.         . magnesium citrate SOLN   Oral   Take 0.5 Bottles by mouth daily as needed (constipation).         . polyethylene glycol powder (GLYCOLAX/MIRALAX) powder      Take 17g 2x today. Take 17g  On 9/3 (2 caps in AM and 2 in PM) if  no bowel movement on 9/2. Take 17 g on 9/4(3 caps in AM and 3 in pm) if no bowel movement on 9/3. Take 17 g on 9/5 (4 caps in AM and 4 in PM) if no bowel movmeent on 9/4. Come see [place] on Friday if no bowel movement by noon.   3350 g   1   . gabapentin (NEURONTIN) 300 MG capsule   Oral   Take 300 mg by mouth 3 (three) times daily.         [name] HYDROcodone-acetaminophen (NORCO/VICODIN) 5-325 MG per tablet   Oral   Take 1 tablet by mouth every 6 (six) hours as needed for pain.   5 tablet   0    Triage Vitals: BP 149/100  Pulse 81  Temp(Src) 98.3 F (36.8 C) (Oral)  Resp 16  SpO2 100%  LMP 03/20/2013 Physical Exam  Nursing note and vitals reviewed. Constitutional: She is oriented to person, place, and time. She appears well-developed and  well-nourished. No distress.  Awake, alert, nontoxic appearance  HENT:  Head: Normocephalic and atraumatic.  Right Ear: Hearing, tympanic membrane and ear canal normal. Right ear exhibits lacerations.  Left Ear: Hearing, tympanic membrane, external ear and ear canal normal.  Ears:  Nose: Nose normal. No mucosal edema.  Mouth/Throat: Uvula is midline, oropharynx is clear and moist and mucous membranes are normal. No oropharyngeal exudate, posterior oropharyngeal edema, posterior oropharyngeal erythema or tonsillar abscesses.  2 cm laceration to the right earlobe where her earring was pulled out; hemostasis achieved and the wound has already begun to heal.  Eyes: Conjunctivae are normal. No scleral icterus.  Neck: Normal range of motion. Neck supple.  Cardiovascular: Normal rate, regular rhythm, normal heart sounds and intact distal pulses.   No murmur heard. Capillary refill < 3 sec  Pulmonary/Chest: Effort normal and breath sounds normal. No respiratory distress. She has no wheezes.  Abdominal: Soft. Bowel sounds are normal. She exhibits no mass. There is no tenderness. There is no rebound and no guarding.  Musculoskeletal: Normal range of motion. She exhibits tenderness. She exhibits no edema.  Tender to palpation of right external hip. No contusion or ecchymosis noted  Neurological: She is alert and oriented to person, place, and time.  Speech is clear and goal oriented Moves extremities without ataxia Patient ambulates without difficulty Sensation to the bilateral lower extremities intact Strength 5 out of 5 in all major joints of the bilateral lower extremities  Skin: Skin is warm and dry. She is not diaphoretic. No erythema.  Psychiatric: She has a normal mood and affect.    ED Course  Procedures (including critical care time) DIAGNOSTIC STUDIES: Oxygen Saturation is 100% on RA, normal by my interpretation.   COORDINATION OF CARE: 7:52 PM- Advised pt to continue taking  Motrin for inflammation. Advised to use ice compresses and will give a prescription for pain medication. Pt verbalizes understanding and agrees to plan.  Medications  ketorolac (TORADOL) injection 60 mg (60 mg Intramuscular Given 03/22/13 2000)    Labs Review Labs Reviewed - No data to display Imaging Review No results found.  EKG Interpretation   None       MDM   1. Hip pain, acute, right   2. Laceration of earlobe, right, initial encounter    CASSIDY TASHIRO presents with persistent pain after fall last week.  I reviewed the images from 03/20/2013 and agree that there is no acute fracture.  Patient with likely persistent deep contusion. Will give  patient a very small amount of pain control and recommend that she continue to use ibuprofen. Patient also recommended to use ice and elevation. I have advised the patient that her pain is likely to persist for another week or more and that this emergency department we'll no longer write further pain control for her hip.  I personally reviewed the imaging tests through PACS system  I reviewed available ER/hospitalization records through the EMR  Patient's right earlobe laceration is 24 hours old. Has already begun to granulate and I will not suture it. Recommended patient keep the wound clean and dry. She may use Neosporin as needed.  There is no cellulitis or evidence of infection at this time.  It has been determined that no acute conditions requiring further emergency intervention are present at this time. The patient/guardian have been advised of the diagnosis and plan. We have discussed signs and symptoms that warrant return to the ED, such as changes or worsening in symptoms.   Vital signs are stable at discharge.   BP 146/102  Pulse 80  Temp(Src) 98.3 F (36.8 C) (Oral)  Resp 16  SpO2 95%  LMP 03/20/2013  Patient/guardian has voiced understanding and agreed to follow-up with the PCP or specialist.       Dierdre Forth, PA-C 03/22/13 2055

## 2013-03-22 NOTE — ED Notes (Signed)
Patient presents with right hip pain from a fall at work. Was seen on this past Thursday when the accident happened. Was not given any pain medications. Rates  Hip pain 8/10. Patient also has right ear wound where an earring was pulled all the way through.

## 2013-03-23 NOTE — ED Provider Notes (Signed)
Medical screening examination/treatment/procedure(s) were performed by non-physician practitioner and as supervising physician I was immediately available for consultation/collaboration.    Sakara Lehtinen D Alondra Sahni, MD 03/23/13 2254 

## 2013-04-02 ENCOUNTER — Encounter (HOSPITAL_COMMUNITY): Payer: Self-pay | Admitting: Emergency Medicine

## 2013-04-02 ENCOUNTER — Emergency Department (HOSPITAL_COMMUNITY)
Admission: EM | Admit: 2013-04-02 | Discharge: 2013-04-02 | Disposition: A | Discharge status: Home / Self Care | Payer: Medicaid Other | Source: Home / Self Care | Attending: Emergency Medicine | Admitting: Emergency Medicine

## 2013-04-02 DIAGNOSIS — A088 Other specified intestinal infections: Secondary | ICD-10-CM

## 2013-04-02 DIAGNOSIS — A084 Viral intestinal infection, unspecified: Secondary | ICD-10-CM

## 2013-04-02 LAB — POCT URINALYSIS DIP (DEVICE)
Bilirubin Urine: NEGATIVE
Glucose, UA: NEGATIVE mg/dL
Ketones, ur: NEGATIVE mg/dL
Leukocytes, UA: NEGATIVE
Nitrite: NEGATIVE
Protein, ur: NEGATIVE mg/dL
Urobilinogen, UA: 1 mg/dL (ref 0.0–1.0)
pH: 6.5 (ref 5.0–8.0)

## 2013-04-02 MED ORDER — ONDANSETRON 4 MG PO TBDP
ORAL_TABLET | ORAL | Status: AC
  Filled 2013-04-02: qty 2

## 2013-04-02 MED ORDER — ONDANSETRON 8 MG PO TBDP: 8.0000 mg | ORAL_TABLET | Freq: Three times a day (TID) | ORAL | Status: DC | PRN

## 2013-04-02 MED ORDER — DICYCLOMINE HCL 20 MG PO TABS: 20.0000 mg | ORAL_TABLET | Freq: Two times a day (BID) | ORAL | Status: DC

## 2013-04-02 MED ORDER — ONDANSETRON 4 MG PO TBDP
8.0000 mg | ORAL_TABLET | Freq: Once | ORAL | Status: AC
  Administered 2013-04-02: 8 mg via ORAL

## 2013-04-02 MED ORDER — DIPHENOXYLATE-ATROPINE 2.5-0.025 MG PO TABS: 1.0000 | ORAL_TABLET | Freq: Four times a day (QID) | ORAL | Status: DC | PRN

## 2013-04-02 NOTE — ED Notes (Signed)
Pt c/o abd pain around naval area onset yest Sxs include: x5 loose stools today and x2 episodes of vomiting, decreased appetite Denies: fevers... Has not had any meds Alert w/no signs of acute distress

## 2013-04-02 NOTE — ED Provider Notes (Signed)
Chief Complaint:   Chief Complaint  Patient presents with  . Abdominal Pain    History of Present Illness:   Terri [name] is a 27 year old female who has a history since yesterday evening of nausea, vomiting, diarrhea, and crampy abdominal pain. She was exposed to a child who had a similar illness. She vomited twice last night and has not vomited all today but she has felt nauseated. She had 4 diarrheal stools yesterday and one so far today. She denies any blood in the vomitus, coffee-ground emesis, or bilious emesis. She's had no blood in the stool or melena. The abdominal pain is localized to the upper abdomen. She denies any fever, chills, or URI symptoms. She has not had any recent foreign travel or suspicious ingestions.  Review of Systems:  Other than noted above, the patient denies any of the following symptoms: Systemic:  No fevers, chills, sweats, weight loss or gain, fatigue, or tiredness. ENT:  No nasal congestion, rhinorrhea, or sore throat. Lungs:  No cough, wheezing, or shortness of breath. Cardiac:  No chest pain, syncope, or presyncope. GI:  No abdominal pain, nausea, vomiting, anorexia, diarrhea, constipation, blood in stool or vomitus. GU:  No dysuria, frequency, or urgency.  PMFSH:  Past medical history, family history, social history, meds, and allergies were reviewed.  She takes hydrochlorothiazide for high blood pressure and also has sciatica. She smokes a third of a pack of cigarettes per day.  Physical Exam:   Vital signs:  BP 142/100  Pulse 85  Temp(Src) 98.5 F (36.9 C) (Oral)  Resp 16  SpO2 100%  LMP 03/20/2013 General:  Alert and oriented.  In no distress.  Skin warm and dry.  Good skin turgor, brisk capillary refill. ENT:  No scleral icterus, moist mucous membranes, no oral lesions, pharynx clear. Lungs:  Breath sounds clear and equal bilaterally.  No wheezes, rales, or rhonchi. Heart:  Rhythm regular, without extrasystoles.  No gallops or  murmers. Abdomen:  Soft, flat, nondistended. She has slight epigastric tenderness to palpation without guarding or rebound. No organomegaly or mass. Bowel sounds are hyperactive. Skin: Clear, warm, and dry.  Good turgor.  Brisk capillary refill.  Labs:   Results for orders placed during the hospital encounter of 04/02/13  POCT URINALYSIS DIP (DEVICE)      Result Value Range   Glucose, UA NEGATIVE  NEGATIVE mg/dL   Bilirubin Urine NEGATIVE  NEGATIVE   Ketones, ur NEGATIVE  NEGATIVE mg/dL   Specific Gravity, Urine 1.020  1.005 - 1.030   Hgb urine dipstick TRACE (*) NEGATIVE   pH 6.5  5.0 - 8.0   Protein, ur NEGATIVE  NEGATIVE mg/dL   Urobilinogen, UA 1.0  0.0 - 1.0 mg/dL   Nitrite NEGATIVE  NEGATIVE   Leukocytes, UA NEGATIVE  NEGATIVE  POCT PREGNANCY, URINE      Result Value Range   Preg Test, Ur NEGATIVE  NEGATIVE     Course in Urgent Care Center:   Given Zofran ODT 8 mg sublingually.  Assessment:  The encounter diagnosis was Viral gastroenteritis.  No evidence of dehydration.  Plan:   1.  Meds:  The following meds were prescribed:   New Prescriptions   DICYCLOMINE (BENTYL) 20 MG TABLET    Take 1 tablet (20 mg total) by mouth 2 (two) times daily.   DIPHENOXYLATE-ATROPINE (LOMOTIL) 2.5-0.025 MG PER TABLET    Take 1 tablet by mouth 4 (four) times daily as needed for diarrhea or loose stools.   ONDANSETRON (  ZOFRAN ODT) 8 MG DISINTEGRATING TABLET    Take 1 tablet (8 mg total) by mouth every 8 (eight) hours as needed for nausea.    2.  Patient Education/Counseling:  The patient was given appropriate handouts, self care instructions, and instructed in symptomatic relief. The patient was told to stay on clear liquids for the remainder of the day, then advance to a B.R.A.T. diet starting tomorrow.   3.  Follow up:  The patient was told to follow up if no better in 3 to 4 days, if becoming worse in any way, and given some red flag symptoms such as persistent vomiting or any signs of GI  bleeding which would prompt immediate return.  Follow up here as necessary.       Reuben Likes, MD 04/02/13 2039

## 2013-05-23 ENCOUNTER — Ambulatory Visit: Payer: Self-pay

## 2013-06-17 ENCOUNTER — Telehealth: Payer: Self-pay | Admitting: Family Medicine

## 2013-06-17 ENCOUNTER — Inpatient Hospital Stay (HOSPITAL_COMMUNITY): Payer: Medicaid Other

## 2013-06-17 ENCOUNTER — Encounter (HOSPITAL_COMMUNITY): Payer: Self-pay | Admitting: General Practice

## 2013-06-17 ENCOUNTER — Telehealth: Payer: Self-pay | Admitting: Sports Medicine

## 2013-06-17 ENCOUNTER — Inpatient Hospital Stay (HOSPITAL_COMMUNITY)
Admission: AD | Admit: 2013-06-17 | Discharge: 2013-06-17 | Disposition: A | Discharge status: Home / Self Care | Payer: Medicaid Other | Source: Ambulatory Visit | Attending: Obstetrics and Gynecology | Admitting: Obstetrics and Gynecology

## 2013-06-17 DIAGNOSIS — N201 Calculus of ureter: Secondary | ICD-10-CM | POA: Insufficient documentation

## 2013-06-17 DIAGNOSIS — I10 Essential (primary) hypertension: Secondary | ICD-10-CM | POA: Insufficient documentation

## 2013-06-17 DIAGNOSIS — F17200 Nicotine dependence, unspecified, uncomplicated: Secondary | ICD-10-CM | POA: Insufficient documentation

## 2013-06-17 DIAGNOSIS — N949 Unspecified condition associated with female genital organs and menstrual cycle: Secondary | ICD-10-CM | POA: Insufficient documentation

## 2013-06-17 DIAGNOSIS — R109 Unspecified abdominal pain: Secondary | ICD-10-CM | POA: Insufficient documentation

## 2013-06-17 DIAGNOSIS — N200 Calculus of kidney: Secondary | ICD-10-CM

## 2013-06-17 DIAGNOSIS — R112 Nausea with vomiting, unspecified: Secondary | ICD-10-CM | POA: Insufficient documentation

## 2013-06-17 LAB — URINALYSIS, ROUTINE W REFLEX MICROSCOPIC
Bilirubin Urine: NEGATIVE
Glucose, UA: NEGATIVE mg/dL
Ketones, ur: NEGATIVE mg/dL
NITRITE: NEGATIVE
Protein, ur: 30 mg/dL — AB
Specific Gravity, Urine: >1.03 — ABNORMAL HIGH (ref 1.005–1.030)
Urobilinogen, UA: 0.2 mg/dL (ref 0.0–1.0)
pH: 5.5 (ref 5.0–8.0)

## 2013-06-17 LAB — URINE MICROSCOPIC-ADD ON

## 2013-06-17 LAB — POCT PREGNANCY, URINE: Preg Test, Ur: NEGATIVE

## 2013-06-17 MED ORDER — ACETAMINOPHEN-CODEINE #3 300-30 MG PO TABS: 1.0000 | ORAL_TABLET | Freq: Once | ORAL | Status: DC

## 2013-06-17 MED ORDER — ACETAMINOPHEN-CODEINE #3 300-30 MG PO TABS
1.0000 | ORAL_TABLET | Freq: Once | ORAL | Status: DC
  Filled 2013-06-17: qty 1

## 2013-06-17 MED ORDER — AMLODIPINE BESYLATE 10 MG PO TABS: 10.0000 mg | ORAL_TABLET | Freq: Every day | ORAL | Status: DC

## 2013-06-17 MED ORDER — KETOROLAC TROMETHAMINE 60 MG/2ML IM SOLN
60.0000 mg | Freq: Once | INTRAMUSCULAR | Status: AC
  Administered 2013-06-17: 60 mg via INTRAMUSCULAR
  Filled 2013-06-17: qty 2

## 2013-06-17 NOTE — MAU Note (Signed)
Patient states she has had abdominal pain since 12-31. Went to [facility] on 1-2 and was diagnosed with BV and UTI and given Rx for both. Patient states she has been unable to keep all the antibiotics down due to vomiting. States the pain is getting worse. Denies any bleeding today but has had a little for the past few days.

## 2013-06-17 NOTE — MAU Provider Note (Signed)
History     CSN: [id number]  Arrival date and time: 06/17/13 6237   First Provider Initiated Contact with Patient 06/17/13 1027      Chief Complaint  Patient presents with  . Abdominal Pain   HPI  Pt is a 28 yo G1P1001 here with report of increasing pelvic pain this morning.  Diagnosed with UTI and BV on Friday and treated with flagyl and cipro.  Unable to keep medication down due to nausea and vomiting.  Pt has history of frequent UTIs that usually resolve after a few days of medication.    Pt Patient's last menstrual period was 05/22/2013. Trying to get pregnant since October 2014.  Used Implanon prior to then for family planning.  History of irregular cycles prior to using Implanon.    Past Medical History  Diagnosis Date  . Hypertention, malignant, with acute intensive management   . Hypertension     History reviewed. No pertinent past surgical history.  History reviewed. No pertinent family history.  History  Substance Use Topics  . Smoking status: Current Every Day Smoker -- 0.20 packs/day    Types: Cigarettes  . Smokeless tobacco: Never Used  . Alcohol Use: Yes     Comment: occassionally    Allergies: No Known Allergies  Prescriptions prior to admission  Medication Sig Dispense Refill  . ciprofloxacin (CIPRO) 500 MG tablet Take 500 mg by mouth 2 (two) times daily.      . metroNIDAZOLE (FLAGYL) 500 MG tablet Take 500 mg by mouth 2 (two) times daily.        Review of Systems  Constitutional: Negative for fever and chills.  Gastrointestinal: Positive for nausea, vomiting and abdominal pain (LLQ). Negative for diarrhea and constipation.  Genitourinary: Positive for urgency and frequency. Negative for dysuria, hematuria and flank pain.  Musculoskeletal: Positive for myalgias.  All other systems reviewed and are negative.   Physical Exam   Blood pressure 152/105, pulse 88, temperature 98.9 F (37.2 C), temperature source Oral, resp. rate 20, height 5' 8.5"  (1.74 m), weight 114.941 kg (253 lb 6.4 oz), last menstrual period 05/22/2013, SpO2 100.00%.  Physical Exam  Constitutional: She is oriented to person, place, and time. She appears well-developed and well-nourished.  HENT:  Head: Normocephalic.  Eyes: Pupils are equal, round, and reactive to light.  Neck: Normal range of motion. Neck supple.  Cardiovascular: Normal rate, regular rhythm and normal heart sounds.   Respiratory: Effort normal and breath sounds normal.  GI: Soft. She exhibits no mass. There is tenderness (LLQ). There is no guarding.  Genitourinary: Right adnexum displays tenderness. Left adnexum displays tenderness. Vaginal discharge (white, creamy) found.  Negative cervical motion tenderness  Neurological: She is alert and oriented to person, place, and time. She has normal reflexes.  Skin: Skin is warm and dry.    MAU Course  Procedures Results for orders placed during the hospital encounter of 06/17/13 (from the past 24 hour(s))  POCT PREGNANCY, URINE     Status: None   Collection Time    06/17/13 10:09 AM      Result Value Range   Preg Test, Ur NEGATIVE  NEGATIVE  URINALYSIS, ROUTINE W REFLEX MICROSCOPIC     Status: Abnormal   Collection Time    06/17/13 10:12 AM      Result Value Range   Color, Urine YELLOW  YELLOW   APPearance CLEAR  CLEAR   Specific Gravity, Urine >1.030 (*) 1.005 - 1.030   pH 5.5  5.0 -  8.0   Glucose, UA NEGATIVE  NEGATIVE mg/dL   Hgb urine dipstick MODERATE (*) NEGATIVE   Bilirubin Urine NEGATIVE  NEGATIVE   Ketones, ur NEGATIVE  NEGATIVE mg/dL   Protein, ur 30 (*) NEGATIVE mg/dL   Urobilinogen, UA 0.2  0.0 - 1.0 mg/dL   Nitrite NEGATIVE  NEGATIVE   Leukocytes, UA SMALL (*) NEGATIVE  URINE MICROSCOPIC-ADD ON     Status: Abnormal   Collection Time    06/17/13 10:12 AM      Result Value Range   Squamous Epithelial / LPF FEW (*) RARE   WBC, UA 21-50  <3 WBC/hpf   RBC / HPF 11-20  <3 RBC/hpf   Ultrasound: FINDINGS:  Uterus   Measurements: 9.8 x 5.2 x 6.0 cm. No fibroids or other mass  visualized.  Endometrium  Thickness: 9 mm. No focal abnormality visualized.  Right ovary  Measurements: 3.9 x 2.7 x 4.8 cm. Normal appearance/no adnexal mass.  Left ovary  Measurements: 4.2 x 2.3 x 4.1 cm. Normal appearance/no adnexal mass.  Other findings  Tiny amount of free fluid seen in pelvic cul-de-sac. Incidentally  noted on transvaginal sonography are 2 small less than 1 cm calculi  in the distal right ureter near the ureterovesical junction. The  distal right ureter is mildly dilated.  IMPRESSION:  Normal appearance of uterus and both ovaries.  Two small distal right ureteral calculi seen near the ureterovesical  junction.  Electronically Signed  By: Earle Gell M.D.  On: 06/17/2013 13:04  1325 Pt reports continued pain > tylenol #3 ordered Assessment and Plan  28 yo G1P1001 - Pelvic Pain Renal Stones Chronic Hypertension  Plan: Ibuprofen for pain RX Norvasc 10 mg PO QD Increase fluids Strain urine Follow-up prn  [facility] 06/17/2013, 10:28 AM

## 2013-06-17 NOTE — Telephone Encounter (Signed)
After hours line, called patient back via hospital operator's request, someone answered hello but did not speak beyond that.   Laroy Apple, MD [place] Resident, PGY-2 06/17/2013, 12:18 AM

## 2013-06-17 NOTE — Telephone Encounter (Signed)
After hours call  Pt called complaining of abdominal pain. She describes it as sharp mid abdominal pain that's been going on for 5 days. She was seen 5 days ago at [facility] and given cipro and flagyl for treatment. She was told to follow up and to get an [place] [facility] which she did not do. She is complaining of constipation and cant remember any flatus for the last several days but tolerated food and fluid today without emesis or nausea making obstruction unlikely. She notes she had pain in a specific place on her pelvic exam and the provider was concerned about ovarian cyst. She is urinating normally and denies dysuria.   I advised her to make an appt first thing in the am in our clinic or to go to urgent care if she prefers. I told her that if her pain was unbearable that she should seek help in the ED. He is worried because she is supposed to go back to work.    She states he will wait until morning unless her pain worsens.   Laroy Apple, MD [place] Resident, PGY-2 06/17/2013, 12:48 AM

## 2013-06-17 NOTE — Telephone Encounter (Signed)
[place] Emergency Line  Patient name: Terri [name]  MRN: [id number]  AGE: 28 y.o.  Gender: female DOB: [birth year]     Primary Care Provider:   Liam Graham, MD     Return call to after hours line at 730.  Continuing to have pain and wondering if she could be seen in clinic. Discussed calling clinic at 830 to schedule appointment to be seen today.  [facility].  Pt was seen at [facility] urgent care over the weekend she will try to get the records today.  --- DISPOSITION: pt to follow up at [facility] today.  >> Ensure negative pregnancy test to rule out Ectopic pregnancy in addition to considering GI complaints.   Gerda Diss, DO [facility] Resident - PGY-3 06/17/2013 8:22 AM

## 2013-06-17 NOTE — Discharge Instructions (Signed)
Kidney Stones  Kidney stones (urolithiasis) are deposits that form inside your kidneys. The intense pain is caused by the stone moving through the urinary tract. When the stone moves, the ureter goes into spasm around the stone. The stone is usually passed in the urine.   CAUSES   · A disorder that makes certain neck glands produce too much parathyroid hormone (primary hyperparathyroidism).  · A buildup of uric acid crystals, similar to gout in your joints.  · Narrowing (stricture) of the ureter.  · A kidney obstruction present at birth (congenital obstruction).  · Previous surgery on the kidney or ureters.  · Numerous kidney infections.  SYMPTOMS   · Feeling sick to your stomach (nauseous).  · Throwing up (vomiting).  · Blood in the urine (hematuria).  · Pain that usually spreads (radiates) to the groin.  · Frequency or urgency of urination.  DIAGNOSIS   · Taking a history and physical exam.  · Blood or urine tests.  · CT scan.  · Occasionally, an examination of the inside of the urinary bladder (cystoscopy) is performed.  TREATMENT   · Observation.  · Increasing your fluid intake.  · Extracorporeal shock wave lithotripsy This is a noninvasive procedure that uses shock waves to break up kidney stones.  · Surgery may be needed if you have severe pain or persistent obstruction. There are various surgical procedures. Most of the procedures are performed with the use of small instruments. Only small incisions are needed to accommodate these instruments, so recovery time is minimized.  The size, location, and chemical composition are all important variables that will determine the proper choice of action for you. Talk to your health care provider to better understand your situation so that you will minimize the risk of injury to yourself and your kidney.   HOME CARE INSTRUCTIONS   · Drink enough water and fluids to keep your urine clear or pale yellow. This will help you to pass the stone or stone fragments.  · Strain  all urine through the provided strainer. Keep all particulate matter and stones for your health care provider to see. The stone causing the pain may be as small as a grain of salt. It is very important to use the strainer each and every time you pass your urine. The collection of your stone will allow your health care provider to analyze it and verify that a stone has actually passed. The stone analysis will often identify what you can do to reduce the incidence of recurrences.  · Only take over-the-counter or prescription medicines for pain, discomfort, or fever as directed by your health care provider.  · Make a follow-up appointment with your health care provider as directed.  · Get follow-up X-rays if required. The absence of pain does not always mean that the stone has passed. It may have only stopped moving. If the urine remains completely obstructed, it can cause loss of kidney function or even complete destruction of the kidney. It is your responsibility to make sure X-rays and follow-ups are completed. Ultrasounds of the kidney can show blockages and the status of the kidney. Ultrasounds are not associated with any radiation and can be performed easily in a matter of minutes.  SEEK MEDICAL CARE IF:  · You experience pain that is progressive and unresponsive to any pain medicine you have been prescribed.  SEEK IMMEDIATE MEDICAL CARE IF:   · Pain cannot be controlled with the prescribed medicine.  · You have a fever   or shaking chills.  · The severity or intensity of pain increases over 18 hours and is not relieved by pain medicine.  · You develop a new onset of abdominal pain.  · You feel faint or pass out.  · You are unable to urinate.  MAKE SURE YOU:   · Understand these instructions.  · Will watch your condition.  · Will get help right away if you are not doing well or get worse.  Document Released: 05/29/2005 Document Revised: 01/29/2013 Document Reviewed: 10/30/2012  Exit[employer]® Patient Information ©2014  [employer].

## 2013-06-18 ENCOUNTER — Ambulatory Visit (INDEPENDENT_AMBULATORY_CARE_PROVIDER_SITE_OTHER): Payer: Medicaid Other | Admitting: Family Medicine

## 2013-06-18 ENCOUNTER — Encounter: Payer: Self-pay | Admitting: Family Medicine

## 2013-06-18 VITALS — BP 139/89 | HR 96 | Temp 98.7°F | Ht 68.0 in | Wt 257.0 lb

## 2013-06-18 DIAGNOSIS — R300 Dysuria: Secondary | ICD-10-CM

## 2013-06-18 DIAGNOSIS — I10 Essential (primary) hypertension: Secondary | ICD-10-CM

## 2013-06-18 DIAGNOSIS — N200 Calculus of kidney: Secondary | ICD-10-CM | POA: Insufficient documentation

## 2013-06-18 HISTORY — DX: Calculus of kidney: N20.0

## 2013-06-18 LAB — BASIC METABOLIC PANEL
BUN: 10 mg/dL (ref 6–23)
CALCIUM: 9.1 mg/dL (ref 8.4–10.5)
CO2: 26 mEq/L (ref 19–32)
CREATININE: 0.84 mg/dL (ref 0.50–1.10)
Chloride: 105 mEq/L (ref 96–112)
Glucose, Bld: 94 mg/dL (ref 70–99)
Potassium: 4.2 mEq/L (ref 3.5–5.3)
Sodium: 136 mEq/L (ref 135–145)

## 2013-06-18 LAB — POCT URINALYSIS DIPSTICK
BILIRUBIN UA: NEGATIVE
GLUCOSE UA: NEGATIVE
Nitrite, UA: NEGATIVE
Protein, UA: 100
Urobilinogen, UA: 0.2
pH, UA: 6.5

## 2013-06-18 LAB — URINE CULTURE
Colony Count: NO GROWTH
Culture: NO GROWTH

## 2013-06-18 LAB — CBC
HEMATOCRIT: 38.2 % (ref 36.0–46.0)
HEMOGLOBIN: 12.9 g/dL (ref 12.0–15.0)
MCH: 30.1 pg (ref 26.0–34.0)
MCHC: 33.8 g/dL (ref 30.0–36.0)
MCV: 89.3 fL (ref 78.0–100.0)
Platelets: 309 10*3/uL (ref 150–400)
RBC: 4.28 MIL/uL (ref 3.87–5.11)
RDW: 13.9 % (ref 11.5–15.5)
WBC: 4.6 10*3/uL (ref 4.0–10.5)

## 2013-06-18 LAB — GC/CHLAMYDIA PROBE AMP
CT PROBE, AMP APTIMA: NEGATIVE
GC Probe RNA: NEGATIVE

## 2013-06-18 LAB — POCT UA - MICROSCOPIC ONLY

## 2013-06-18 MED ORDER — HYDROCODONE-ACETAMINOPHEN 5-325 MG PO TABS: 1.0000 | ORAL_TABLET | Freq: Four times a day (QID) | ORAL | Status: DC | PRN

## 2013-06-18 MED ORDER — HYDROCHLOROTHIAZIDE 12.5 MG PO TABS: 12.5000 mg | ORAL_TABLET | Freq: Every day | ORAL | Status: DC

## 2013-06-18 MED ORDER — IBUPROFEN 600 MG PO TABS: 600.0000 mg | ORAL_TABLET | Freq: Four times a day (QID) | ORAL | Status: DC | PRN

## 2013-06-18 NOTE — Patient Instructions (Signed)
I am starting you back on low dose hydrochlorothiazide. I would like to see you back for a full physical with PAP smear. At that time, we will also check your blood pressure.   For the pain, it is likely from kidney stones. Please take the ibuprofen with food and the vicodin as needed for pain.  I will call you if any of the labwork is abnormal.   Return to clinic if you are not better, if you have fevers or chills or not keeping fluids down.   Kidney Stones Kidney stones (urolithiasis) are deposits that form inside your kidneys. The intense pain is caused by the stone moving through the urinary tract. When the stone moves, the ureter goes into spasm around the stone. The stone is usually passed in the urine.  CAUSES   A disorder that makes certain neck glands produce too much parathyroid hormone (primary hyperparathyroidism).  A buildup of uric acid crystals, similar to gout in your joints.  Narrowing (stricture) of the ureter.  A kidney obstruction present at birth (congenital obstruction).  Previous surgery on the kidney or ureters.  Numerous kidney infections. SYMPTOMS   Feeling sick to your stomach (nauseous).  Throwing up (vomiting).  Blood in the urine (hematuria).  Pain that usually spreads (radiates) to the groin.  Frequency or urgency of urination. DIAGNOSIS   Taking a history and physical exam.  Blood or urine tests.  CT scan.  Occasionally, an examination of the inside of the urinary bladder (cystoscopy) is performed. TREATMENT   Observation.  Increasing your fluid intake.  Extracorporeal shock wave lithotripsy This is a noninvasive procedure that uses shock waves to break up kidney stones.  Surgery may be needed if you have severe pain or persistent obstruction. There are various surgical procedures. Most of the procedures are performed with the use of small instruments. Only small incisions are needed to accommodate these instruments, so recovery time  is minimized. The size, location, and chemical composition are all important variables that will determine the proper choice of action for you. Talk to your health care provider to better understand your situation so that you will minimize the risk of injury to yourself and your kidney.  HOME CARE INSTRUCTIONS   Drink enough water and fluids to keep your urine clear or pale yellow. This will help you to pass the stone or stone fragments.  Strain all urine through the provided strainer. Keep all particulate matter and stones for your health care provider to see. The stone causing the pain may be as small as a grain of salt. It is very important to use the strainer each and every time you pass your urine. The collection of your stone will allow your health care provider to analyze it and verify that a stone has actually passed. The stone analysis will often identify what you can do to reduce the incidence of recurrences.  Only take over-the-counter or prescription medicines for pain, discomfort, or fever as directed by your health care provider.  Make a follow-up appointment with your health care provider as directed.  Get follow-up X-rays if required. The absence of pain does not always mean that the stone has passed. It may have only stopped moving. If the urine remains completely obstructed, it can cause loss of kidney function or even complete destruction of the kidney. It is your responsibility to make sure X-rays and follow-ups are completed. Ultrasounds of the kidney can show blockages and the status of the kidney. Ultrasounds are  not associated with any radiation and can be performed easily in a matter of minutes. SEEK MEDICAL CARE IF:  You experience pain that is progressive and unresponsive to any pain medicine you have been prescribed. SEEK IMMEDIATE MEDICAL CARE IF:   Pain cannot be controlled with the prescribed medicine.  You have a fever or shaking chills.  The severity or  intensity of pain increases over 18 hours and is not relieved by pain medicine.  You develop a new onset of abdominal pain.  You feel faint or pass out.  You are unable to urinate. MAKE SURE YOU:   Understand these instructions.  Will watch your condition.  Will get help right away if you are not doing well or get worse. Document Released: 05/29/2005 Document Revised: 01/29/2013 Document Reviewed: 10/30/2012 [facility] Patient Information 2014 [place].

## 2013-06-18 NOTE — Progress Notes (Signed)
Patient ID: Terri [name]    DOB: 31-Jul-1985, 28 y.o.   MRN: [id number] --- Subjective:  Terri [name] is a 28 y.o.female with h/o HTN, asthma who presents for follow up on abdominal pain.  Started having severe pelvic pain located in the right side groin on Friday. She was seen at urgent care and was diagnosed with a UTI and bacterial vaginosis for which she obtained Cipro and Flagyl. She took Flagyl which made her nauseous. She had worsening of pain on Sunday associated with vomiting. She was seen at [facility] on January 6 and was diagnosed with right-sided kidney stones in the uretero-cystic junction. Ultrasound was negative for any ovarian etiology. She was given ibuprofen and told to followup. Since then, she continues to have abdominal pain. Pain is located in the right groin area, is described as pulling, sharp and lasting about 1 minute at a time. She had an episode of vomiting yesterday early morning. She had some diarrhea this morning. She reports some blood in the urine. No burning with urination. She does report increased frequency with small amounts of urination at a time. She reports drinking water and cranberry juice continuously throughout the day in the last couple days. She denies any history of kidney stones in the past. She does report drinking more alcohol during the holiday season.   - Hypertension: Was found to have elevated blood pressure on one of her visits. In the MA you: She was prescribed amlodipine. She had been on hydrochlorothiazide in the past. She ran out of her prescription a few months ago. She did tolerate the hydrochlorothiazide well at the time.  ROS: see HPI Past Medical History: reviewed and updated medications and allergies. Social History: Tobacco: Current every day smoker  Objective: Filed Vitals:   06/18/13 0909  BP: 139/89  Pulse: 96  Temp: 98.7 F (37.1 C)    Physical Examination:   General appearance - alert, well appearing, and in no  distress Abdomen-tenderness along the right groin and suprapubic region. No rebound, no guarding. No CVA tenderness.   06/17/13: US pelvis complete FINDINGS:  Uterus  Measurements: 9.8 x 5.2 x 6.0 cm. No fibroids or other mass  visualized.  Endometrium  Thickness: 9 mm. No focal abnormality visualized.  Right ovary  Measurements: 3.9 x 2.7 x 4.8 cm. Normal appearance/no adnexal mass.  Left ovary  Measurements: 4.2 x 2.3 x 4.1 cm. Normal appearance/no adnexal mass.  Other findings  Tiny amount of free fluid seen in pelvic cul-de-sac. Incidentally  noted on transvaginal sonography are 2 small less than 1 cm calculi  in the distal right ureter near the ureterovesical junction. The  distal right ureter is mildly dilated.  IMPRESSION:  Normal appearance of uterus and both ovaries.  Two small distal right ureteral calculi seen near the ureterovesical  junction.

## 2013-06-18 NOTE — Assessment & Plan Note (Addendum)
Recommended that she hold on the amlodipine. Will restart her on hydrochlorothiazide 12.5 mg since she did tolerate this in the past. Patient to follow up in a couple of weeks

## 2013-06-18 NOTE — MAU Provider Note (Signed)
Attestation of Attending Supervision of Advanced Practitioner (CNM/NP): Evaluation and management procedures were performed by the Advanced Practitioner under my supervision and collaboration.  I have reviewed the Advanced Practitioner's note and chart, and I agree with the management and plan.  Lisa Milian 06/18/2013 7:31 AM   

## 2013-06-18 NOTE — Assessment & Plan Note (Addendum)
Likely explanation of patient's pain. Would like to get a basic metabolic panel to evaluate her kidney function. Urine culture is still pending. Will follow up on this result to see if we need to start her back on antibiotics for a UTI. Prescription for ibuprofen 600 mg every 6 hours as needed for pain as well as Vicodin in the setting of kidney stones. Gave her a note to allow her to resume work on 06/19/2013 with bathroom breaks for associated urinary urgency.

## 2013-06-19 ENCOUNTER — Encounter: Payer: Self-pay | Admitting: Family Medicine

## 2013-07-12 ENCOUNTER — Encounter (HOSPITAL_COMMUNITY): Payer: Self-pay

## 2013-07-12 ENCOUNTER — Inpatient Hospital Stay (HOSPITAL_COMMUNITY)
Admission: AD | Admit: 2013-07-12 | Discharge: 2013-07-12 | Disposition: A | Discharge status: Home / Self Care | Payer: Medicaid Other | Source: Ambulatory Visit | Attending: Obstetrics and Gynecology | Admitting: Obstetrics and Gynecology

## 2013-07-12 DIAGNOSIS — Y92009 Unspecified place in unspecified non-institutional (private) residence as the place of occurrence of the external cause: Secondary | ICD-10-CM | POA: Insufficient documentation

## 2013-07-12 DIAGNOSIS — S0083XA Contusion of other part of head, initial encounter: Secondary | ICD-10-CM

## 2013-07-12 DIAGNOSIS — O99330 Smoking (tobacco) complicating pregnancy, unspecified trimester: Secondary | ICD-10-CM | POA: Insufficient documentation

## 2013-07-12 DIAGNOSIS — S8000XA Contusion of unspecified knee, initial encounter: Secondary | ICD-10-CM | POA: Insufficient documentation

## 2013-07-12 DIAGNOSIS — S0003XA Contusion of scalp, initial encounter: Secondary | ICD-10-CM | POA: Insufficient documentation

## 2013-07-12 DIAGNOSIS — R51 Headache: Secondary | ICD-10-CM | POA: Insufficient documentation

## 2013-07-12 DIAGNOSIS — S1093XA Contusion of unspecified part of neck, initial encounter: Secondary | ICD-10-CM

## 2013-07-12 DIAGNOSIS — O9989 Other specified diseases and conditions complicating pregnancy, childbirth and the puerperium: Secondary | ICD-10-CM

## 2013-07-12 DIAGNOSIS — R55 Syncope and collapse: Secondary | ICD-10-CM

## 2013-07-12 DIAGNOSIS — O2650 Maternal hypotension syndrome, unspecified trimester: Secondary | ICD-10-CM | POA: Insufficient documentation

## 2013-07-12 DIAGNOSIS — O99891 Other specified diseases and conditions complicating pregnancy: Secondary | ICD-10-CM | POA: Insufficient documentation

## 2013-07-12 DIAGNOSIS — W19XXXA Unspecified fall, initial encounter: Secondary | ICD-10-CM | POA: Insufficient documentation

## 2013-07-12 DIAGNOSIS — S40029A Contusion of unspecified upper arm, initial encounter: Secondary | ICD-10-CM | POA: Insufficient documentation

## 2013-07-12 DIAGNOSIS — T148XXA Other injury of unspecified body region, initial encounter: Secondary | ICD-10-CM

## 2013-07-12 LAB — COMPREHENSIVE METABOLIC PANEL
ALBUMIN: 3.9 g/dL (ref 3.5–5.2)
ALT: 10 U/L (ref 0–35)
AST: 13 U/L (ref 0–37)
Alkaline Phosphatase: 82 U/L (ref 39–117)
BILIRUBIN TOTAL: 0.4 mg/dL (ref 0.3–1.2)
BUN: 6 mg/dL (ref 6–23)
CALCIUM: 9 mg/dL (ref 8.4–10.5)
CHLORIDE: 102 meq/L (ref 96–112)
CO2: 28 mEq/L (ref 19–32)
Creatinine, Ser: 1.02 mg/dL (ref 0.50–1.10)
GFR calc Af Amer: 87 mL/min — ABNORMAL LOW (ref >90)
GFR calc non Af Amer: 75 mL/min — ABNORMAL LOW (ref >90)
Glucose, Bld: 92 mg/dL (ref 70–99)
Potassium: 4.2 mEq/L (ref 3.7–5.3)
Sodium: 140 mEq/L (ref 137–147)
Total Protein: 7.5 g/dL (ref 6.0–8.3)

## 2013-07-12 LAB — URINE MICROSCOPIC-ADD ON

## 2013-07-12 LAB — CBC
HEMATOCRIT: 36.8 % (ref 36.0–46.0)
HEMOGLOBIN: 12.3 g/dL (ref 12.0–15.0)
MCH: 30 pg (ref 26.0–34.0)
MCHC: 33.4 g/dL (ref 30.0–36.0)
MCV: 89.8 fL (ref 78.0–100.0)
Platelets: 260 10*3/uL (ref 150–400)
RBC: 4.1 MIL/uL (ref 3.87–5.11)
RDW: 12.9 % (ref 11.5–15.5)
WBC: 4.7 10*3/uL (ref 4.0–10.5)

## 2013-07-12 LAB — URINALYSIS, ROUTINE W REFLEX MICROSCOPIC
Bilirubin Urine: NEGATIVE
GLUCOSE, UA: NEGATIVE mg/dL
HGB URINE DIPSTICK: NEGATIVE
KETONES UR: NEGATIVE mg/dL
Leukocytes, UA: NEGATIVE
Nitrite: NEGATIVE
Protein, ur: 30 mg/dL — AB
Specific Gravity, Urine: 1.02 (ref 1.005–1.030)
Urobilinogen, UA: 0.2 mg/dL (ref 0.0–1.0)
pH: 6 (ref 5.0–8.0)

## 2013-07-12 LAB — POCT PREGNANCY, URINE: Preg Test, Ur: NEGATIVE

## 2013-07-12 NOTE — MAU Provider Note (Signed)
History     CSN: [id number]  Arrival date and time: 07/12/13 1122   First Provider Initiated Contact with Patient 07/12/13 1154      Chief Complaint  Patient presents with  . Headache  . Loss of Consciousness   HPI This is a 28 y.o. female who presents with c/o headache and painful "lumps" on her head and right knee after presumably falling at home. Woke up at her doorway at 4am, fell back asleep until 10am and came here. States has knot on head and right knee with pain. Has bruises on left arm. Had migraine all day yesterday but took nothing. Is on Amliodipine and HCTZ but did not take either yesterday. Seen by Dr Otis Dials on 06/18/13 for hypertension.   RN Note: Pt presents complaining of a headache and soreness after passing out last evening. States she came home from work around [place] and woke up on the floor around 4am. No memory of passing out. Fell back asleep until 10am then came here. States she has a sore spot on her head and knee. Continuing headache from yesterday with sharp pains today. Denies vaginal discharge or bleeding.        OB History   Grav Para Term Preterm Abortions TAB SAB Ect Mult Living   1 1 1       1       Past Medical History  Diagnosis Date  . Hypertention, malignant, with acute intensive management   . Hypertension     History reviewed. No pertinent past surgical history.  History reviewed. No pertinent family history.  History  Substance Use Topics  . Smoking status: Current Every Day Smoker -- 0.20 packs/day    Types: Cigarettes  . Smokeless tobacco: Never Used  . Alcohol Use: Yes     Comment: occassionally    Allergies: No Known Allergies  Prescriptions prior to admission  Medication Sig Dispense Refill  . amLODipine (NORVASC) 10 MG tablet Take 1 tablet (10 mg total) by mouth daily.  30 tablet  11  . HYDROcodone-acetaminophen (NORCO/VICODIN) 5-325 MG per tablet Take 1 tablet by mouth every 6 (six) hours as needed for moderate pain.  20  tablet  0  . Pseudoeph-Doxylamine-DM-APAP (NYQUIL PO) Take 1 tablet by mouth every 8 (eight) hours as needed. Pt took for cold symptoms        Review of Systems  Constitutional: Positive for malaise/fatigue. Negative for fever and chills.  Eyes: Positive for blurred vision. Negative for double vision, photophobia and pain.  Respiratory: Negative for shortness of breath.   Cardiovascular: Negative for chest pain.  Gastrointestinal: Negative for nausea, vomiting and abdominal pain.  Neurological: Positive for loss of consciousness (last night) and headaches. Negative for dizziness, sensory change, speech change, focal weakness and seizures.  Psychiatric/Behavioral: Negative for depression.   Physical Exam   Blood pressure 135/94, pulse 90, temperature 99 F (37.2 C), temperature source Oral, resp. rate 18, last menstrual period 06/15/2013.  Physical Exam  Constitutional: She is oriented to person, place, and time. She appears well-developed and well-nourished. No distress (sitting cross-legged in bed, awake and alert, speech appropriate).  HENT:  Head: Normocephalic.  Small contusion/lump on top of head   Neck: Normal range of motion.  Cardiovascular: Normal rate, regular rhythm and normal heart sounds.  Exam reveals no gallop and no friction rub.   No murmur heard. Respiratory: Effort normal and breath sounds normal. No respiratory distress. She has no wheezes. She has no rales. She exhibits no  tenderness.  GI: Soft. There is no tenderness.  Musculoskeletal: Normal range of motion. She exhibits tenderness (over right patella, contusion ).  Neurological: She is alert and oriented to person, place, and time. She has normal strength. She is not disoriented. She exhibits normal muscle tone. She displays no seizure activity. Coordination normal. GCS eye subscore is 4. GCS verbal subscore is 5. GCS motor subscore is 6.  Skin: Skin is warm and dry.  Psychiatric: She has a normal mood and  affect.   Orthostatic VS Filed Vitals:   07/12/13 1206 07/12/13 1209 07/12/13 1211 07/12/13 1229  BP: 145/97 139/97 135/94 140/98  Pulse: 81 81 90 81  Temp:      TempSrc:      Resp:         MAU Course  Procedures  MDM Results for orders placed during the hospital encounter of 07/12/13 (from the past 24 hour(s))  POCT PREGNANCY, URINE     Status: None   Collection Time    07/12/13 11:52 AM      Result Value Range   Preg Test, Ur NEGATIVE  NEGATIVE  CBC     Status: None   Collection Time    07/12/13 12:00 PM      Result Value Range   WBC 4.7  4.0 - 10.5 K/uL   RBC 4.10  3.87 - 5.11 MIL/uL   Hemoglobin 12.3  12.0 - 15.0 g/dL   HCT 36.8  36.0 - 46.0 %   MCV 89.8  78.0 - 100.0 fL   MCH 30.0  26.0 - 34.0 pg   MCHC 33.4  30.0 - 36.0 g/dL   RDW 12.9  11.5 - 15.5 %   Platelets 260  150 - 400 K/uL   CMET pending  Assessment and Plan  A:  Syncopal episode at home with prolonged loss of consciousness      Fall       Contusions of head, right knee and left arm      Vitals stable, patient alert and oriented (drove self here), no emergency condition apparent  P:  Discussed with Dr Elly Modena      Transfer to [facility] ED for trauma and LOC evaluation      Offered [facility], declined, states feels she can drive herself      Message sent to Dr Laren Boom ED notified  [facility] 07/12/2013, 12:14 PM

## 2013-07-12 NOTE — MAU Note (Signed)
Pt presents complaining of a headache and soreness after passing out last evening. States she came home from work around [place] and woke up on the floor around 4am. No memory of passing out. Fell back asleep until 10am then came here. States she has a sore spot on her head and knee. Continuing headache from yesterday with sharp pains today. Denies vaginal discharge or bleeding.

## 2013-07-12 NOTE — MAU Provider Note (Signed)
Attestation of Attending Supervision of Advanced Practitioner (CNM/NP): Evaluation and management procedures were performed by the Advanced Practitioner under my supervision and collaboration.  I have reviewed the Advanced Practitioner's note and chart, and I agree with the management and plan.  Zeric Baranowski 07/12/2013 6:57 PM

## 2013-07-12 NOTE — Discharge Instructions (Signed)
Syncope  Syncope is a fainting spell. This means the person loses consciousness and drops to the ground. The person is generally unconscious for less than 5 minutes. The person may have some muscle twitches for up to 15 seconds before waking up and returning to normal. Syncope occurs more often in elderly people, but it can happen to anyone. While most causes of syncope are not dangerous, syncope can be a sign of a serious medical problem. It is important to seek medical care.   CAUSES   Syncope is caused by a sudden decrease in blood flow to the brain. The specific cause is often not determined. Factors that can trigger syncope include:   Taking medicines that lower blood pressure.   Sudden changes in posture, such as standing up suddenly.   Taking more medicine than prescribed.   Standing in one place for too long.   Seizure disorders.   Dehydration and excessive exposure to heat.   Low blood sugar (hypoglycemia).   Straining to have a bowel movement.   Heart disease, irregular heartbeat, or other circulatory problems.   Fear, emotional distress, seeing blood, or severe pain.  SYMPTOMS   Right before fainting, you may:   Feel dizzy or lightheaded.   Feel nauseous.   See all white or all black in your field of vision.   Have cold, clammy skin.  DIAGNOSIS   Your caregiver will ask about your symptoms, perform a physical exam, and perform electrocardiography (ECG) to record the electrical activity of your heart. Your caregiver may also perform other heart or blood tests to determine the cause of your syncope.  TREATMENT   In most cases, no treatment is needed. Depending on the cause of your syncope, your caregiver may recommend changing or stopping some of your medicines.  HOME CARE INSTRUCTIONS   Have someone stay with you until you feel stable.   Do not drive, operate machinery, or play sports until your caregiver says it is okay.   Keep all follow-up appointments as directed by your  caregiver.   Lie down right away if you start feeling like you might faint. Breathe deeply and steadily. Wait until all the symptoms have passed.   Drink enough fluids to keep your urine clear or pale yellow.   If you are taking blood pressure or heart medicine, get up slowly, taking several minutes to sit and then stand. This can reduce dizziness.  SEEK IMMEDIATE MEDICAL CARE IF:    You have a severe headache.   You have unusual pain in the chest, abdomen, or back.   You are bleeding from the mouth or rectum, or you have black or tarry stool.   You have an irregular or very fast heartbeat.   You have pain with breathing.   You have repeated fainting or seizure-like jerking during an episode.   You faint when sitting or lying down.   You have confusion.   You have difficulty walking.   You have severe weakness.   You have vision problems.  If you fainted, call your local emergency services (911 in [place] Do not drive yourself to the hospital.   MAKE SURE YOU:   Understand these instructions.   Will watch your condition.   Will get help right away if you are not doing well or get worse.  Document Released: 05/29/2005 Document Revised: 11/28/2011 Document Reviewed: 07/28/2011  [employer] Patient Information 2014 [employer].

## 2013-07-13 LAB — URINE CULTURE
COLONY COUNT: NO GROWTH
Culture: NO GROWTH

## 2013-07-15 ENCOUNTER — Ambulatory Visit (HOSPITAL_COMMUNITY)
Admission: RE | Admit: 2013-07-15 | Discharge: 2013-07-15 | Disposition: A | Discharge status: Home / Self Care | Payer: Medicaid Other | Source: Ambulatory Visit | Attending: Family Medicine | Admitting: Family Medicine

## 2013-07-15 ENCOUNTER — Ambulatory Visit (INDEPENDENT_AMBULATORY_CARE_PROVIDER_SITE_OTHER): Payer: Medicaid Other | Admitting: Family Medicine

## 2013-07-15 ENCOUNTER — Encounter: Payer: Self-pay | Admitting: Family Medicine

## 2013-07-15 VITALS — BP 131/84 | HR 99 | Temp 98.3°F | Ht 68.0 in | Wt 246.0 lb

## 2013-07-15 DIAGNOSIS — R55 Syncope and collapse: Secondary | ICD-10-CM | POA: Insufficient documentation

## 2013-07-15 NOTE — Progress Notes (Signed)
Patient ID: Terri [name]    DOB: 06-05-1986, 28 y.o.   MRN: [id number] --- Subjective:  Terri [name] is a 28 y.o.female who presents for follow up on recent MAU visit.  Patient was seen  07/12/2013 in the MAU after a syncopal episode. She reports that on Friday evening at 6 PM she remembers coming to her house and woke up at 4 AM not remembering anything. She states that she had a left bruised knee and and not on her head. She had a headache at the time. She went to the MAU later in the morning to be evaluated. There she had basic lab work which was normal. She was instructed to go to [facility] for further evaluation and possible head imaging. She declined [facility]. She was driving to [facility] and had an emergency with her son and was therefore never evaluated.  She doesn't remember anything prior to the episode. She denies feeling any chest pain, palpitations, dizziness. She denies any stool or urine incontinence or tongue biting when she woke up. She did have a headache located in the temporal area bilaterally described as sharp lasting a few minutes on the day of the episode. No associated vision changes, no photophobia, no phonophobia. No vomiting or nausea. She denies any alcohol use or any other drug use prior to the incident. Nobody was present to witness it. Since then she has a mild headache frontal that comes and goes. Pain is better with 800 mg of ibuprofen. No associated vision changes or nausea.   ROS: see HPI Past Medical History: reviewed and updated medications and allergies. Social History: Tobacco: 0.20 pack per day  Objective: Filed Vitals:   07/15/13 1141  BP: 131/84  Pulse: 99  Temp: 98.3 F (36.8 C)    Physical Examination:   General appearance - alert, well appearing, and in no distress Chest - clear to auscultation, no wheezes, rales or rhonchi, symmetric air entry Heart - normal rate, regular rhythm, normal S1, S2, no murmurs Neuro - CN2-12 grossly intact, normal hand  grip bilaterally, normal finger to nose bilaterally, Fundal exam attempted but not clearly visualized.  Head - small, non painful lump on left side of top of head Right knee - soft tissue swelling above patella, minimal tenderness with palpation, no echymosis or color change  EKG: normal sinus rhythm, rate of 74 bpm Normal QTc, normal PR No St changes

## 2013-07-15 NOTE — Assessment & Plan Note (Signed)
Unclear etiology at this point. Differential includes: cardiogenic arrhythmia vs seizure vs vaso vagal vs hypotension vs substance abuse. Patient denies any substance use prior to episode. No UDS at the time of eval. Patient has been normotensive making hypotension less likely.  EKG is normal not showing any evidence of long QTc or WPW which could have prompted arrhythmia. No evidence of incontinence or tongue biting making seizure less likely although still a possibility.  No focal deficit on neuro exam and headache improving with ibuprofen. Would suspect that if she had an intracranial hemorrhage from her fall on Saturday, her symptoms would be worst. Will therefore hold on head imaging.   - follow up in 2 weeks - if headache worsens, patient instructed to return to care - if she has another episode, patient instructed to call 911 and go to [facility]. She expressed understanding and agreed with plan.

## 2013-07-15 NOTE — Patient Instructions (Signed)
Your EKG was normal.  If this happens again, please go to the [facility]. If you have worsening headache, please come back to the clinic.   Follow up with me in 2 weeks.   Syncope Syncope is a fainting spell. This means the person loses consciousness and drops to the ground. The person is generally unconscious for less than 5 minutes. The person may have some muscle twitches for up to 15 seconds before waking up and returning to normal. Syncope occurs more often in elderly people, but it can happen to anyone. While most causes of syncope are not dangerous, syncope can be a sign of a serious medical problem. It is important to seek medical care.  CAUSES  Syncope is caused by a sudden decrease in blood flow to the brain. The specific cause is often not determined. Factors that can trigger syncope include:  Taking medicines that lower blood pressure.  Sudden changes in posture, such as standing up suddenly.  Taking more medicine than prescribed.  Standing in one place for too long.  Seizure disorders.  Dehydration and excessive exposure to heat.  Low blood sugar (hypoglycemia).  Straining to have a bowel movement.  Heart disease, irregular heartbeat, or other circulatory problems.  Fear, emotional distress, seeing blood, or severe pain. SYMPTOMS  Right before fainting, you may:  Feel dizzy or lightheaded.  Feel nauseous.  See all white or all black in your field of vision.  Have cold, clammy skin. DIAGNOSIS  Your caregiver will ask about your symptoms, perform a physical exam, and perform electrocardiography (ECG) to record the electrical activity of your heart. Your caregiver may also perform other heart or blood tests to determine the cause of your syncope. TREATMENT  In most cases, no treatment is needed. Depending on the cause of your syncope, your caregiver may recommend changing or stopping some of your medicines. HOME CARE INSTRUCTIONS  Have someone stay with you until  you feel stable.  Do not drive, operate machinery, or play sports until your caregiver says it is okay.  Keep all follow-up appointments as directed by your caregiver.  Lie down right away if you start feeling like you might faint. Breathe deeply and steadily. Wait until all the symptoms have passed.  Drink enough fluids to keep your urine clear or pale yellow.  If you are taking blood pressure or heart medicine, get up slowly, taking several minutes to sit and then stand. This can reduce dizziness. SEEK IMMEDIATE MEDICAL CARE IF:   You have a severe headache.  You have unusual pain in the chest, abdomen, or back.  You are bleeding from the mouth or rectum, or you have black or tarry stool.  You have an irregular or very fast heartbeat.  You have pain with breathing.  You have repeated fainting or seizure-like jerking during an episode.  You faint when sitting or lying down.  You have confusion.  You have difficulty walking.  You have severe weakness.  You have vision problems. If you fainted, call your local emergency services (911 in [place] Do not drive yourself to the hospital.  MAKE SURE YOU:  Understand these instructions.  Will watch your condition.  Will get help right away if you are not doing well or get worse. Document Released: 05/29/2005 Document Revised: 11/28/2011 Document Reviewed: 07/28/2011 [facility] Patient Information 2014 [employer].

## 2013-08-20 ENCOUNTER — Telehealth: Payer: Self-pay | Admitting: *Deleted

## 2013-08-20 NOTE — Telephone Encounter (Signed)
Tennisha from [facility] called to request NPI number for patient.  Pt in clinic for questionable food poisoning. NPI number given. Derl Barrow, RN

## 2013-10-02 ENCOUNTER — Encounter: Payer: Self-pay | Admitting: Family Medicine

## 2013-10-02 ENCOUNTER — Ambulatory Visit (INDEPENDENT_AMBULATORY_CARE_PROVIDER_SITE_OTHER): Payer: Medicaid Other | Admitting: Family Medicine

## 2013-10-02 VITALS — BP 147/90 | HR 92 | Ht 68.0 in | Wt 253.0 lb

## 2013-10-02 DIAGNOSIS — N3946 Mixed incontinence: Secondary | ICD-10-CM

## 2013-10-02 DIAGNOSIS — I10 Essential (primary) hypertension: Secondary | ICD-10-CM

## 2013-10-02 MED ORDER — HYDROCHLOROTHIAZIDE 25 MG PO TABS: 25.0000 mg | ORAL_TABLET | Freq: Every day | ORAL | Status: DC

## 2013-10-02 NOTE — Patient Instructions (Signed)
Please follow up in 2 weeks with me.

## 2013-10-03 DIAGNOSIS — N3946 Mixed incontinence: Secondary | ICD-10-CM | POA: Insufficient documentation

## 2013-10-03 NOTE — Assessment & Plan Note (Addendum)
Long standing problem. Will refer patient to urology for full evaluation.  Filled out FMLA for patient to be accommodated to take more frequent breaks and be able to take 1-2 days off per month for further medical evaluation.

## 2013-10-03 NOTE — Progress Notes (Signed)
Patient ID: SHATORI BERTUCCI    DOB: 1986-03-02, 28 y.o.   MRN: [id number] --- Subjective:  Brookelin is a 28 y.o.female who presents for completion of FMLA form.  - FMLA form filled out to allow patient to be off 1-2 days per months if necessary for evaluation and treatment of mixed incontinence and urinary urgency.  Patient reports over 5 years of urinary incontinence. She reports need leaking with sneezing, coughing or lifting. She also reports increased urge and difficulty making it to the bathroom on time. Additionally, she has increased urinary frequency during the day, making it difficult to sit for prolonged periods of time at her job. She works at a call center answering calls and taking bathroom breaks can be difficult.  Problem has been getting more severe with time. This was exacerbated by a recent episode of nephrolithiasis which was treated with supportive care.   - HTN: patient lost her BP medicine: amlodipine and has not been on it for several days. No shortness of breath, no chest pain, no lower extremity edema, no palpitations. She does report headaches. She checked her BP yesterday when she had a headache and it was 164/80. She denies any double vision.    ROS: see HPI Past Medical History: reviewed and updated medications and allergies. Social History: Tobacco: current every day smoker  Objective: Filed Vitals:   10/02/13 1634  BP: 147/90  Pulse: 92    Physical Examination:   General appearance - alert, well appearing, and in no distress Chest - clear to auscultation, no wheezes, rales or rhonchi, symmetric air entry Heart - normal rate, regular rhythm, normal S1, S2, no murmurs, rubs, clicks or gallops Abdomen - soft, nontender, nondistended, no masses or organomegaly

## 2013-10-03 NOTE — Assessment & Plan Note (Signed)
Stop amlodipine and start hctz.  Patient to follow up with me in 2 weeks for blood pressure check and BMP.

## 2013-10-13 ENCOUNTER — Emergency Department (HOSPITAL_COMMUNITY)
Admission: EM | Admit: 2013-10-13 | Discharge: 2013-10-13 | Disposition: A | Discharge status: Home / Self Care | Payer: Self-pay | Source: Home / Self Care

## 2013-10-13 NOTE — ED Notes (Signed)
Pt assessed.  Nose bleeds off/on since Saturday.  States "feels like my pressure is up".    Pt BP is slightly elevated .  Pt sent back to waiting area.  Pt to inform front desk of any changes.  Pt voices understanding.  Mw,cma

## 2013-10-13 NOTE — ED Notes (Signed)
This pt was called in all waiting areas - no response from pt.

## 2013-10-31 ENCOUNTER — Encounter: Payer: Self-pay | Admitting: Family Medicine

## 2013-10-31 ENCOUNTER — Telehealth: Payer: Self-pay | Admitting: Family Medicine

## 2013-10-31 NOTE — Telephone Encounter (Signed)
Will send letter in the mail letting patient know of her appointment.   Liam Graham, PGY-3 Family Medicine Resident

## 2013-10-31 NOTE — Telephone Encounter (Signed)
Attempt to call pt and phone number that is in Epic is not in service.  Appt 06/01 @1 :[place]

## 2013-11-11 ENCOUNTER — Emergency Department (HOSPITAL_COMMUNITY): Payer: Self-pay

## 2013-11-11 ENCOUNTER — Encounter (HOSPITAL_COMMUNITY): Payer: Self-pay | Admitting: Emergency Medicine

## 2013-11-11 ENCOUNTER — Emergency Department (HOSPITAL_COMMUNITY)
Admission: EM | Admit: 2013-11-11 | Discharge: 2013-11-11 | Disposition: A | Discharge status: Home / Self Care | Payer: Self-pay | Attending: Emergency Medicine | Admitting: Emergency Medicine

## 2013-11-11 DIAGNOSIS — S0003XA Contusion of scalp, initial encounter: Secondary | ICD-10-CM | POA: Insufficient documentation

## 2013-11-11 DIAGNOSIS — S99929A Unspecified injury of unspecified foot, initial encounter: Secondary | ICD-10-CM

## 2013-11-11 DIAGNOSIS — I10 Essential (primary) hypertension: Secondary | ICD-10-CM | POA: Insufficient documentation

## 2013-11-11 DIAGNOSIS — S99919A Unspecified injury of unspecified ankle, initial encounter: Secondary | ICD-10-CM

## 2013-11-11 DIAGNOSIS — Y929 Unspecified place or not applicable: Secondary | ICD-10-CM | POA: Insufficient documentation

## 2013-11-11 DIAGNOSIS — Z3202 Encounter for pregnancy test, result negative: Secondary | ICD-10-CM | POA: Insufficient documentation

## 2013-11-11 DIAGNOSIS — R55 Syncope and collapse: Secondary | ICD-10-CM | POA: Insufficient documentation

## 2013-11-11 DIAGNOSIS — S0083XA Contusion of other part of head, initial encounter: Secondary | ICD-10-CM | POA: Insufficient documentation

## 2013-11-11 DIAGNOSIS — R42 Dizziness and giddiness: Secondary | ICD-10-CM | POA: Insufficient documentation

## 2013-11-11 DIAGNOSIS — F17200 Nicotine dependence, unspecified, uncomplicated: Secondary | ICD-10-CM | POA: Insufficient documentation

## 2013-11-11 DIAGNOSIS — S8990XA Unspecified injury of unspecified lower leg, initial encounter: Secondary | ICD-10-CM | POA: Insufficient documentation

## 2013-11-11 DIAGNOSIS — S59909A Unspecified injury of unspecified elbow, initial encounter: Secondary | ICD-10-CM | POA: Insufficient documentation

## 2013-11-11 DIAGNOSIS — W1809XA Striking against other object with subsequent fall, initial encounter: Secondary | ICD-10-CM | POA: Insufficient documentation

## 2013-11-11 DIAGNOSIS — S59919A Unspecified injury of unspecified forearm, initial encounter: Secondary | ICD-10-CM

## 2013-11-11 DIAGNOSIS — Z79899 Other long term (current) drug therapy: Secondary | ICD-10-CM | POA: Insufficient documentation

## 2013-11-11 DIAGNOSIS — IMO0002 Reserved for concepts with insufficient information to code with codable children: Secondary | ICD-10-CM | POA: Insufficient documentation

## 2013-11-11 DIAGNOSIS — S1093XA Contusion of unspecified part of neck, initial encounter: Secondary | ICD-10-CM

## 2013-11-11 DIAGNOSIS — Y93E1 Activity, personal bathing and showering: Secondary | ICD-10-CM | POA: Insufficient documentation

## 2013-11-11 DIAGNOSIS — S6990XA Unspecified injury of unspecified wrist, hand and finger(s), initial encounter: Secondary | ICD-10-CM

## 2013-11-11 DIAGNOSIS — R209 Unspecified disturbances of skin sensation: Secondary | ICD-10-CM | POA: Insufficient documentation

## 2013-11-11 LAB — URINALYSIS, ROUTINE W REFLEX MICROSCOPIC
Bilirubin Urine: NEGATIVE
GLUCOSE, UA: NEGATIVE mg/dL
HGB URINE DIPSTICK: NEGATIVE
KETONES UR: NEGATIVE mg/dL
Leukocytes, UA: NEGATIVE
Nitrite: NEGATIVE
PROTEIN: 100 mg/dL — AB
Specific Gravity, Urine: 1.022 (ref 1.005–1.030)
Urobilinogen, UA: 0.2 mg/dL (ref 0.0–1.0)
pH: 7 (ref 5.0–8.0)

## 2013-11-11 LAB — TROPONIN I
Troponin I: <0.3 ng/mL (ref <0.30)
Troponin I: <0.3 ng/mL (ref <0.30)

## 2013-11-11 LAB — CBC WITH DIFFERENTIAL/PLATELET
Basophils Absolute: 0 10*3/uL (ref 0.0–0.1)
Basophils Relative: 0 % (ref 0–1)
EOS PCT: 0 % (ref 0–5)
Eosinophils Absolute: 0 10*3/uL (ref 0.0–0.7)
HCT: 38.7 % (ref 36.0–46.0)
HEMOGLOBIN: 12.9 g/dL (ref 12.0–15.0)
Lymphocytes Relative: 30 % (ref 12–46)
Lymphs Abs: 1.9 10*3/uL (ref 0.7–4.0)
MCH: 30.4 pg (ref 26.0–34.0)
MCHC: 33.3 g/dL (ref 30.0–36.0)
MCV: 91.3 fL (ref 78.0–100.0)
MONOS PCT: 9 % (ref 3–12)
Monocytes Absolute: 0.6 10*3/uL (ref 0.1–1.0)
Neutro Abs: 3.8 10*3/uL (ref 1.7–7.7)
Neutrophils Relative %: 61 % (ref 43–77)
Platelets: 288 10*3/uL (ref 150–400)
RBC: 4.24 MIL/uL (ref 3.87–5.11)
RDW: 13.1 % (ref 11.5–15.5)
WBC: 6.2 10*3/uL (ref 4.0–10.5)

## 2013-11-11 LAB — COMPREHENSIVE METABOLIC PANEL
ALT: 18 U/L (ref 0–35)
AST: 34 U/L (ref 0–37)
Albumin: 3.6 g/dL (ref 3.5–5.2)
Alkaline Phosphatase: 71 U/L (ref 39–117)
BUN: 9 mg/dL (ref 6–23)
CO2: 26 mEq/L (ref 19–32)
Calcium: 8.6 mg/dL (ref 8.4–10.5)
Chloride: 107 mEq/L (ref 96–112)
Creatinine, Ser: 0.88 mg/dL (ref 0.50–1.10)
GFR calc Af Amer: >90 mL/min (ref >90)
GFR, EST NON AFRICAN AMERICAN: 89 mL/min — AB (ref >90)
Glucose, Bld: 80 mg/dL (ref 70–99)
Potassium: 3.8 mEq/L (ref 3.7–5.3)
SODIUM: 142 meq/L (ref 137–147)
Total Bilirubin: <0.2 mg/dL — ABNORMAL LOW (ref 0.3–1.2)
Total Protein: 6.7 g/dL (ref 6.0–8.3)

## 2013-11-11 LAB — URINE MICROSCOPIC-ADD ON

## 2013-11-11 LAB — POC URINE PREG, ED: PREG TEST UR: NEGATIVE

## 2013-11-11 MED ORDER — SODIUM CHLORIDE 0.9 % IV BOLUS (SEPSIS)
500.0000 mL | Freq: Once | INTRAVENOUS | Status: AC
  Administered 2013-11-11: 500 mL via INTRAVENOUS

## 2013-11-11 MED ORDER — METOCLOPRAMIDE HCL 5 MG/ML IJ SOLN
10.0000 mg | Freq: Once | INTRAMUSCULAR | Status: AC
  Administered 2013-11-11: 10 mg via INTRAVENOUS
  Filled 2013-11-11: qty 2

## 2013-11-11 NOTE — ED Notes (Signed)
Pt reports syncope today at home, reports feeling dizzy prior to syncope.  Pt reports when she came to, she was on the floor and with a h/a.  Hematoma with small abrasion noted on her forehead.  Pt is A&Ox 4 at this time.

## 2013-11-11 NOTE — ED Notes (Signed)
Patient had IV in right arm when I attempted to draw blood in left arm patient stated that I had to use her right arm. I asked patient if left arm was restricted.Then patient was verbally abusive toward me for beforeattempting blood draw for labs.Patient refused all procedures. PA Marissa was made aware of why blood was not drawn.

## 2013-11-11 NOTE — ED Notes (Signed)
Tolerating po fluids

## 2013-11-11 NOTE — Progress Notes (Signed)
[facility] did not get to see patient but will be sending information about [facility] [employer], using address provided.

## 2013-11-11 NOTE — ED Provider Notes (Signed)
CSN: [id number]     Arrival date & time 11/11/13  1420 History   First MD Initiated Contact with Patient 11/11/13 1514     Chief Complaint  Patient presents with  . Loss of Consciousness     (Consider location/radiation/quality/duration/timing/severity/associated sxs/prior Treatment) The history is provided by the patient. No language interpreter was used.  Terri [name] is a 28 year old female with past medical history hypertension presenting to the ED with a syncopal episode that occurred today at approximately 11:00 AM. As per patient, reported that when she woke up this morning she has been feeling dizzy for the past couple morning-reported that she felt extremely dizzy this morning. Stated that while she was in the shower she had sudden onset of dizziness and fainted. Stated that she fell a shower hitting her head. Stated that when she awoke she awoke in the shower-reported that she knew where she was most going on. Stated that she got up, continued to get ready for work. Stated that when she arrived worse she continued to feel dizzy he did not feel well. Stated that she had a constant throbbing sensation to her head as well as tingling to her fingers. Denied nausea, vomiting, chest pain, shortness of breath, difficulty breathing, blurred vision, sudden loss of vision, neck pain. Patient reported that she's had this happen before proximally one month ago. Patient reports that for the past couple weeks he's been having ongoing dizziness when she wakes up the morning, reported that she believes this is from her hypertension. Patient reported that she has had syncopal episodes before.  PCP Dr. Otis Dials   Past Medical History  Diagnosis Date  . Hypertention, malignant, with acute intensive management   . Hypertension    History reviewed. No pertinent past surgical history. No family history on file. History  Substance Use Topics  . Smoking status: Current Every Day Smoker -- 0.20 packs/day   Types: Cigarettes  . Smokeless tobacco: Never Used  . Alcohol Use: Yes     Comment: occassionally   OB History   Grav Para Term Preterm Abortions TAB SAB Ect Mult Living   1 1 1       1      Review of Systems  Constitutional: Negative for fever and chills.  Eyes: Negative for photophobia and visual disturbance.  Respiratory: Negative for cough and shortness of breath.   Cardiovascular: Negative for chest pain.  Gastrointestinal: Negative for nausea and vomiting.  Musculoskeletal: Positive for arthralgias (right wrist and knee). Negative for gait problem and neck pain.  Skin: Positive for wound (hematoma to the forehead).  Neurological: Positive for dizziness and syncope. Negative for weakness and numbness.      Allergies  Review of patient's allergies indicates no known allergies.  Home Medications   Prior to Admission medications   Medication Sig Start Date End Date Taking? Authorizing Provider  hydrochlorothiazide (HYDRODIURIL) 25 MG tablet Take 1 tablet (25 mg total) by mouth daily. 10/02/13 10/02/14 Yes Kandis Nab, MD   BP 140/96  Pulse 90  Temp(Src) 98.7 F (37.1 C) (Oral)  Resp 20  SpO2 96%  LMP 10/28/2013 Physical Exam  Nursing note and vitals reviewed. Constitutional: She is oriented to person, place, and time. She appears well-developed and well-nourished. No distress.  HENT:  Head:    Right Ear: Hearing, tympanic membrane, external ear and ear canal normal.  Left Ear: Hearing, tympanic membrane, external ear and ear canal normal.  Mouth/Throat: Oropharynx is clear and moist.  No oropharyngeal exudate.  Negative crepitus or depressions palpated to the skull  Hematoma identified to the Center of the forehead with superficial abrasion identified. Superficial abrasion identified to the tip of the nose.  Negative crepitus upon palpation to the nasal bones. Swollen turbinates identified-negative septal hematoma noted.  Negative damage noted to dentition.  Negative abrasions noted to the buccal mucosa.   Eyes: Conjunctivae and EOM are normal. Pupils are equal, round, and reactive to light. Right eye exhibits no discharge. Left eye exhibits no discharge.  Negative nystagmus  Visual fields grossly intact  Neck: Normal range of motion. Neck supple. No tracheal deviation present.  Negative neck stiffness Negative nuchal rigidity A cervical lymphadenopathy Negative meningeal signs Negative pain upon palpation to C-spine  Cardiovascular: Normal rate, regular rhythm and normal heart sounds.  Exam reveals no friction rub.   No murmur heard. Pulmonary/Chest: Effort normal and breath sounds normal. No respiratory distress. She has no wheezes. She has no rales.  Patient is able to speak in full sentences without difficulty Negative use of accessory muscles Negative tried her  Musculoskeletal: Normal range of motion.  Negative pain upon palpation to the spine, negative deformities identified to the spine Negative swelling, erythema, inflammation, lesions, sores, deformities identified to the right wrist. Negative snuffbox tenderness. Full range of motion identified. Full range of motion to right hand. Full range of motion to the digits.  Negative swelling, erythema, formation, lesions, sores, deformities identified to the right knee. Negative posterior and anterior draw sign. Discomfort upon palpation to the anterior aspect of the right knee, patellar region. Negative laxity. Stable right knee joint noted. Full ROM to upper and lower extremities without difficulty noted, negative ataxia noted.  Lymphadenopathy:    She has no cervical adenopathy.  Neurological: She is alert and oriented to person, place, and time. No cranial nerve deficit. She exhibits normal muscle tone. Coordination normal. GCS eye subscore is 4. GCS verbal subscore is 5. GCS motor subscore is 6.  GCS 15 Cranial nerves III-XII grossly intact Strength 5+/5+ to upper and lower extremities  bilaterally with resistance applied, equal distribution noted Strength intact to MCP, PIP, DIP joints of right hand Sensation intact Equal grip strength bilaterally Negative facial drooping Negative slurred speech Negative aphasia Negative arm drift Fine motor skills intact Negative Romberg Gait proper, proper balance - negative sway, negative drift, negative step-offs  Skin: Skin is warm and dry. No rash noted. She is not diaphoretic. No erythema.  Psychiatric: She has a normal mood and affect. Her behavior is normal. Thought content normal.    ED Course  Procedures (including critical care time)  Results for orders placed during the hospital encounter of 11/11/13  CBC WITH DIFFERENTIAL      Result Value Ref Range   WBC 6.2  4.0 - 10.5 K/uL   RBC 4.24  3.87 - 5.11 MIL/uL   Hemoglobin 12.9  12.0 - 15.0 g/dL   HCT 38.7  36.0 - 46.0 %   MCV 91.3  78.0 - 100.0 fL   MCH 30.4  26.0 - 34.0 pg   MCHC 33.3  30.0 - 36.0 g/dL   RDW 13.1  11.5 - 15.5 %   Platelets 288  150 - 400 K/uL   Neutrophils Relative % 61  43 - 77 %   Neutro Abs 3.8  1.7 - 7.7 K/uL   Lymphocytes Relative 30  12 - 46 %   Lymphs Abs 1.9  0.7 - 4.0 K/uL   Monocytes Relative  9  3 - 12 %   Monocytes Absolute 0.6  0.1 - 1.0 K/uL   Eosinophils Relative 0  0 - 5 %   Eosinophils Absolute 0.0  0.0 - 0.7 K/uL   Basophils Relative 0  0 - 1 %   Basophils Absolute 0.0  0.0 - 0.1 K/uL  TROPONIN I      Result Value Ref Range   Troponin I <0.30  <0.30 ng/mL  URINALYSIS, ROUTINE W REFLEX MICROSCOPIC      Result Value Ref Range   Color, Urine YELLOW  YELLOW   APPearance CLOUDY (*) CLEAR   Specific Gravity, Urine 1.022  1.005 - 1.030   pH 7.0  5.0 - 8.0   Glucose, UA NEGATIVE  NEGATIVE mg/dL   Hgb urine dipstick NEGATIVE  NEGATIVE   Bilirubin Urine NEGATIVE  NEGATIVE   Ketones, ur NEGATIVE  NEGATIVE mg/dL   Protein, ur 100 (*) NEGATIVE mg/dL   Urobilinogen, UA 0.2  0.0 - 1.0 mg/dL   Nitrite NEGATIVE  NEGATIVE    Leukocytes, UA NEGATIVE  NEGATIVE  URINE MICROSCOPIC-ADD ON      Result Value Ref Range   Squamous Epithelial / LPF RARE  RARE   WBC, UA 0-2  <3 WBC/hpf   Bacteria, UA RARE  RARE  COMPREHENSIVE METABOLIC PANEL      Result Value Ref Range   Sodium 142  137 - 147 mEq/L   Potassium 3.8  3.7 - 5.3 mEq/L   Chloride 107  96 - 112 mEq/L   CO2 26  19 - 32 mEq/L   Glucose, Bld 80  70 - 99 mg/dL   BUN 9  6 - 23 mg/dL   Creatinine, Ser 0.88  0.50 - 1.10 mg/dL   Calcium 8.6  8.4 - 10.5 mg/dL   Total Protein 6.7  6.0 - 8.3 g/dL   Albumin 3.6  3.5 - 5.2 g/dL   AST 34  0 - 37 U/L   ALT 18  0 - 35 U/L   Alkaline Phosphatase 71  39 - 117 U/L   Total Bilirubin <0.2 (*) 0.3 - 1.2 mg/dL   GFR calc non Af Amer 89 (*) >90 mL/min   GFR calc Af Amer >90  >90 mL/min  TROPONIN I      Result Value Ref Range   Troponin I <0.30  <0.30 ng/mL  POC URINE PREG, ED      Result Value Ref Range   Preg Test, Ur NEGATIVE  NEGATIVE    Labs Review Labs Reviewed  URINALYSIS, ROUTINE W REFLEX MICROSCOPIC - Abnormal; Notable for the following:    APPearance CLOUDY (*)    Protein, ur 100 (*)    All other components within normal limits  COMPREHENSIVE METABOLIC PANEL - Abnormal; Notable for the following:    Total Bilirubin <0.2 (*)    GFR calc non Af Amer 89 (*)    All other components within normal limits  CBC WITH DIFFERENTIAL  TROPONIN I  URINE MICROSCOPIC-ADD ON  TROPONIN I  POC URINE PREG, ED    Imaging Review Dg Chest 2 View  11/11/2013   CLINICAL DATA:  Loss of consciousness  EXAM: CHEST  2 VIEW  COMPARISON:  10/14/2012  FINDINGS: Normal heart size. Clear lungs. No pneumothorax or pleural effusion.  IMPRESSION: No active cardiopulmonary disease.   Electronically Signed   By: Maryclare Bean M.D.   On: 11/11/2013 16:57   Dg Wrist Complete Right  11/11/2013   CLINICAL DATA:  fall  EXAM: RIGHT WRIST - COMPLETE 3+ VIEW  COMPARISON:  None.  FINDINGS: There is no evidence of fracture or dislocation. There is no  evidence of arthropathy or other focal bone abnormality. Soft tissues are unremarkable.  IMPRESSION: Negative.   Electronically Signed   By: Margaree Mackintosh M.D.   On: 11/11/2013 16:56   Ct Head Wo Contrast  11/11/2013   CLINICAL DATA:  Syncopal episode.  Fell.  Hit head.  EXAM: CT HEAD WITHOUT CONTRAST  CT MAXILLOFACIAL WITHOUT CONTRAST  CT CERVICAL SPINE WITHOUT CONTRAST  TECHNIQUE: Multidetector CT imaging of the head, cervical spine, and maxillofacial structures were performed using the standard protocol without intravenous contrast. Multiplanar CT image reconstructions of the cervical spine and maxillofacial structures were also generated.  COMPARISON:  None.  FINDINGS: CT HEAD FINDINGS  The ventricles are normal in size and configuration. No extra-axial fluid collections are identified. The gray-white differentiation is normal. No CT findings for acute intracranial process such as hemorrhage or infarction. No mass lesions. The brainstem and cerebellum are grossly normal.  The bony structures are intact. The paranasal sinuses and mastoid air cells are clear. The globes are intact.  CT MAXILLOFACIAL FINDINGS  No acute facial bone fractures are identified. The mandibular condyles are normally located. No mandible fracture. The globes are intact. The paranasal sinuses and mastoid air cells are clear. Moderate dental disease is noted with scattered right-sided dental caries.  CT CERVICAL SPINE FINDINGS  Moderate reversal of the normal cervical lordosis likely due to the forward flexed position of the patient's head.  Normal alignment of the cervical vertebral bodies. Disc spaces and vertebral bodies are maintained. No acute fracture or abnormal prevertebral soft tissue swelling. The facets are normally aligned. The skullbase C1 and C1-2 articulations are maintained. The dens is normal. No large disc protrusions, spinal or foraminal stenosis. The lung apices are clear.  IMPRESSION: No acute intracranial findings or  skull fracture.  No acute facial bone fractures.  Reversal of the normal cervical lordosis likely due to the forward flexed position of the patient's head. Normal alignment of the cervical vertebral bodies and no acute fracture.   Electronically Signed   By: Kalman Jewels M.D.   On: 11/11/2013 16:56   Ct Cervical Spine Wo Contrast  11/11/2013   CLINICAL DATA:  Syncopal episode.  Fell.  Hit head.  EXAM: CT HEAD WITHOUT CONTRAST  CT MAXILLOFACIAL WITHOUT CONTRAST  CT CERVICAL SPINE WITHOUT CONTRAST  TECHNIQUE: Multidetector CT imaging of the head, cervical spine, and maxillofacial structures were performed using the standard protocol without intravenous contrast. Multiplanar CT image reconstructions of the cervical spine and maxillofacial structures were also generated.  COMPARISON:  None.  FINDINGS: CT HEAD FINDINGS  The ventricles are normal in size and configuration. No extra-axial fluid collections are identified. The gray-white differentiation is normal. No CT findings for acute intracranial process such as hemorrhage or infarction. No mass lesions. The brainstem and cerebellum are grossly normal.  The bony structures are intact. The paranasal sinuses and mastoid air cells are clear. The globes are intact.  CT MAXILLOFACIAL FINDINGS  No acute facial bone fractures are identified. The mandibular condyles are normally located. No mandible fracture. The globes are intact. The paranasal sinuses and mastoid air cells are clear. Moderate dental disease is noted with scattered right-sided dental caries.  CT CERVICAL SPINE FINDINGS  Moderate reversal of the normal cervical lordosis likely due to the forward flexed position of the patient's head.  Normal alignment of  the cervical vertebral bodies. Disc spaces and vertebral bodies are maintained. No acute fracture or abnormal prevertebral soft tissue swelling. The facets are normally aligned. The skullbase C1 and C1-2 articulations are maintained. The dens is normal.  No large disc protrusions, spinal or foraminal stenosis. The lung apices are clear.  IMPRESSION: No acute intracranial findings or skull fracture.  No acute facial bone fractures.  Reversal of the normal cervical lordosis likely due to the forward flexed position of the patient's head. Normal alignment of the cervical vertebral bodies and no acute fracture.   Electronically Signed   By: Kalman Jewels M.D.   On: 11/11/2013 16:56   Dg Knee Complete 4 Views Right  11/11/2013   CLINICAL DATA:  fall  EXAM: RIGHT KNEE - COMPLETE 4+ VIEW  COMPARISON:  None.  FINDINGS: There is no evidence of fracture, dislocation, or joint effusion. There is no evidence of arthropathy or other focal bone abnormality. Soft tissues are unremarkable.  IMPRESSION: Negative.   Electronically Signed   By: Margaree Mackintosh M.D.   On: 11/11/2013 16:57   Ct Maxillofacial Wo Cm  11/11/2013   CLINICAL DATA:  Syncopal episode.  Fell.  Hit head.  EXAM: CT HEAD WITHOUT CONTRAST  CT MAXILLOFACIAL WITHOUT CONTRAST  CT CERVICAL SPINE WITHOUT CONTRAST  TECHNIQUE: Multidetector CT imaging of the head, cervical spine, and maxillofacial structures were performed using the standard protocol without intravenous contrast. Multiplanar CT image reconstructions of the cervical spine and maxillofacial structures were also generated.  COMPARISON:  None.  FINDINGS: CT HEAD FINDINGS  The ventricles are normal in size and configuration. No extra-axial fluid collections are identified. The gray-white differentiation is normal. No CT findings for acute intracranial process such as hemorrhage or infarction. No mass lesions. The brainstem and cerebellum are grossly normal.  The bony structures are intact. The paranasal sinuses and mastoid air cells are clear. The globes are intact.  CT MAXILLOFACIAL FINDINGS  No acute facial bone fractures are identified. The mandibular condyles are normally located. No mandible fracture. The globes are intact. The paranasal sinuses and  mastoid air cells are clear. Moderate dental disease is noted with scattered right-sided dental caries.  CT CERVICAL SPINE FINDINGS  Moderate reversal of the normal cervical lordosis likely due to the forward flexed position of the patient's head.  Normal alignment of the cervical vertebral bodies. Disc spaces and vertebral bodies are maintained. No acute fracture or abnormal prevertebral soft tissue swelling. The facets are normally aligned. The skullbase C1 and C1-2 articulations are maintained. The dens is normal. No large disc protrusions, spinal or foraminal stenosis. The lung apices are clear.  IMPRESSION: No acute intracranial findings or skull fracture.  No acute facial bone fractures.  Reversal of the normal cervical lordosis likely due to the forward flexed position of the patient's head. Normal alignment of the cervical vertebral bodies and no acute fracture.   Electronically Signed   By: Kalman Jewels M.D.   On: 11/11/2013 16:56     EKG Interpretation   Date/Time:  Tuesday November 11 2013 15:00:01 EDT Ventricular Rate:  79 PR Interval:  192 QRS Duration: 75 QT Interval:  365 QTC Calculation: 418 R Axis:   62 Text Interpretation:  Sinus rhythm Left atrial enlargement Confirmed by  HORTON  MD, COURTNEY ([id number]) on 11/11/2013 4:47:13 PM      MDM   Final diagnoses:  Syncope   Medications  metoCLOPramide (REGLAN) injection 10 mg (10 mg Intravenous Given 11/11/13 1849)  sodium chloride  0.9 % bolus 500 mL (500 mLs Intravenous New Bag/Given 11/11/13 1849)   Filed Vitals:   11/11/13 1536 11/11/13 1537 11/11/13 1822 11/11/13 1942  BP: 148/106 144/93 153/108 140/96  Pulse: 78 89 83 90  Temp:    98.7 F (37.1 C)  TempSrc:    Oral  Resp:   12 20  SpO2:   99% 96%    EKG noted normal sinus rhythm with a heart rate of 79 beats per minute with mild left atrial enlargement. Troponin negative elevation. Second troponin negative elevation. CBC negative elevated white blood cell count-negative  left shift or leukocytosis noted. CMP kidneys and liver functioning well-BUN 9, creatinine 0.88. Negative elevation of AST, ALT, alkaline phosphatase. Bilirubin negative elevation. Urinalysis negative for hemoglobin, negative nitrites and leukocytes identified. Negative findings for infection. Urine pregnancy negative. Chest x-ray negative for acute cardiopulmonary disease. Right wrist plain film negative for acute osseous injury. Right knee plain film negative for acute osseous injury dislocation, soft tissues unremarkable. Negative signs of joint effusion. CT head negative for acute intracranial abnormalities or skull fractures noted. CT maxillofacial negative for acute facial bone fractures. CT cervical spine identified reversal of the normal cervical lordosis likely due to flexed position of the patient's head, normal alignment of the cervical bodies with negative acute abnormalities noted. Visual acuity unremarkable. Orthostatics unremarkable. Negative findings of endorgan damage - doubt HTN crisis or emergency. Imaging unremarkable - negative findings for acute abnormalities or injuries. Negative focal neurological deficits identified. Patient is alert and oriented, GCS 15. Gait proper with-negative step-offs or sway. Negative Romberg. Patient stable, afebrile. Patient not septic appearing. Blood pressure controlled while in ED setting. Patient tolerated by mouth fluids without difficulty-negative episodes of emesis while in ED setting. Patient denies chest pain, shortness of breath, difficulty breathing, dizziness, visual changes. Suspicion of syncopal episode secondary to poor blood pressure control, cannot rule out possible vasovagal since patient was in the shower. Cannot rule out possible concussion. Patient neurovascularly intact. Discussed case with attending physician who agreed to plan of discharge. Discharged patient. Referred patient to PCP and Neurology. Discussed with patient to rest and stay  hydrated. Discussed with patient post concussive syndrome-discussed consequences. Discussed with patient to avoid any physical or strenuous activity. Discussed with patient to closely monitor symptoms and if symptoms are to worsen or change to report back to the ED - strict return instructions given.  Patient agreed to plan of care, understood, all questions answered.   Jamse Mead, PA-C 11/12/13 [place], PA-C 11/12/13 1152

## 2013-11-11 NOTE — Discharge Instructions (Signed)
Please call your doctor for a followup appointment within 24-48 hours. When you talk to your doctor please let them know that you were seen in the emergency department and have them acquire all of your records so that they can discuss the findings with you and formulate a treatment plan to fully care for your new and ongoing problems. Please call and set up an appointment with your primary care provider to be reassessed this week Please call and set up an appointment with neurology Please avoid any physical or strenuous activity for this can lead to postconcussive syndrome which can lead to irreversible brain damage - no sports or physical contact for one month  Please rest and stay hydrated Please avoid any physical strenuous activity Please closely monitor blood pressure-highly recommended blood pressure cuff to be required to be found at any [facility], [facility], [facility] Please continue to monitor symptoms closely if symptoms are to worsen or change (fever greater than 101, chills, chest pain, shortness of breath, difficulty breathing, numbness, tingling, blurred vision, some loss of vision, nausea, vomiting, diarrhea, neck pain, neck stiffness, headaches, dizziness, confusion, disorientation, weakness, fall, injury, head injury) please report back to the ED immediately  Post-Concussion Syndrome Post-concussion syndrome means you have problems after a head injury. The problems can last for weeks or months. The problems usually go away on their own over time. HOME CARE   Only take medicines as told by your doctor. Do not take aspirin.  Sleep with your head raised (elevated) to help with headaches.  Avoid activities that can cause another head injury.  Do not play contact sports like football, hockey, soccer or basketball. Do not do other risky activities like downhill skiing or martial arts, or ride horses until your doctor says it is OK.  Keep all doctor visits as told. GET HELP RIGHT AWAY  IF:  You feel confused or very sleepy.  You cannot wake the injured person.  You feel sick to your stomach (nauseous) or keep throwing up (vomiting).  You feel like you are moving when you are not (vertigo).  You notice the injured person's eyes moving back and forth very fast.  You start shaking (convulsing) or pass out (faint).  You have very bad headaches that do not get better with medicine.  You cannot use your arms or legs normally.  The black centers of your eyes (pupils) change size.  You have clear or bloody fluid coming from your nose or ears.  Your problems get worse, not better. MAKE SURE YOU:  Understand these instructions.  Will watch your condition.  Will get help right away if you are not doing well or get worse. Document Released: 07/06/2004 Document Revised: 03/19/2013 Document Reviewed: 12/15/2010 [facility] Patient Information 2014 [place]. Concussion, Adult A concussion, or closed-head injury, is a brain injury caused by a direct blow to the head or by a quick and sudden movement (jolt) of the head or neck. Concussions are usually not life-threatening. Even so, the effects of a concussion can be serious. If you have had a concussion before, you are more likely to experience concussion-like symptoms after a direct blow to the head.  CAUSES   Direct blow to the head, such as from running into another player during a soccer game, being hit in a fight, or hitting your head on a hard surface.  A jolt of the head or neck that causes the brain to move back and forth inside the skull, such as in a  car crash. SIGNS AND SYMPTOMS  The signs of a concussion can be hard to notice. Early on, they may be missed by you, family members, and health care providers. You may look fine but act or feel differently. Symptoms are usually temporary, but they may last for days, weeks, or even longer. Some symptoms may appear right away while others may not show up for hours  or days. Every head injury is different. Symptoms include:   Mild to moderate headaches that will not go away.  A feeling of pressure inside your head.  Having more trouble than usual:   Learning or remembering things you have heard.  Answering questions.  Paying attention or concentrating.   Organizing daily tasks.   Making decisions and solving problems.   Slowness in thinking, acting or reacting, speaking, or reading.   Getting lost or being easily confused.   Feeling tired all the time or lacking energy (fatigued).   Feeling drowsy.   Sleep disturbances.   Sleeping more than usual.   Sleeping less than usual.   Trouble falling asleep.   Trouble sleeping (insomnia).   Loss of balance or feeling lightheaded or dizzy.   Nausea or vomiting.   Numbness or tingling.   Increased sensitivity to:   Sounds.   Lights.   Distractions.   Vision problems or eyes that tire easily.   Diminished sense of taste or smell.   Ringing in the ears.   Mood changes such as feeling sad or anxious.   Becoming easily irritated or angry for little or no reason.   Lack of motivation.  Seeing or hearing things other people do not see or hear (hallucinations). DIAGNOSIS  Your health care provider can usually diagnose a concussion based on a description of your injury and symptoms. He or she will ask whether you passed out (lost consciousness) and whether you are having trouble remembering events that happened right before and during your injury.  Your evaluation might include:   A brain scan to look for signs of injury to the brain. Even if the test shows no injury, you may still have a concussion.   Blood tests to be sure other problems are not present. TREATMENT   Concussions are usually treated in an emergency department, in urgent care, or at a clinic. You may need to stay in the hospital overnight for further treatment.   Tell your  health care provider if you are taking any medicines, including prescription medicines, over-the-counter medicines, and natural remedies. Some medicines, such as blood thinners (anticoagulants) and aspirin, may increase the chance of complications. Also tell your health care provider whether you have had alcohol or are taking illegal drugs. This information may affect treatment.  Your health care provider will send you home with important instructions to follow.  How fast you will recover from a concussion depends on many factors. These factors include how severe your concussion is, what part of your brain was injured, your age, and how healthy you were before the concussion.  Most people with mild injuries recover fully. Recovery can take time. In general, recovery is slower in older persons. Also, persons who have had a concussion in the past or have other medical problems may find that it takes longer to recover from their current injury. HOME CARE INSTRUCTIONS  General Instructions  Carefully follow the directions your health care provider gave you.  Only take over-the-counter or prescription medicines for pain, discomfort, or fever as directed by your health  care provider.  Take only those medicines that your health care provider has approved.  Do not drink alcohol until your health care provider says you are well enough to do so. Alcohol and certain other drugs may slow your recovery and can put you at risk of further injury.  If it is harder than usual to remember things, write them down.  If you are easily distracted, try to do one thing at a time. For example, do not try to watch TV while fixing dinner.  Talk with family members or close friends when making important decisions.  Keep all follow-up appointments. Repeated evaluation of your symptoms is recommended for your recovery.  Watch your symptoms and tell others to do the same. Complications sometimes occur after a concussion.  Older adults with a brain injury may have a higher risk of serious complications such as of a blood clot on the brain.  Tell your teachers, school nurse, school counselor, coach, athletic trainer, or work Freight forwarder about your injury, symptoms, and restrictions. Tell them about what you can or cannot do. They should watch for:   Increased problems with attention or concentration.   Increased difficulty remembering or learning new information.   Increased time needed to complete tasks or assignments.   Increased irritability or decreased ability to cope with stress.   Increased symptoms.   Rest. Rest helps the brain to heal. Make sure you:  Get plenty of sleep at night. Avoid staying up late at night.  Keep the same bedtime hours on weekends and weekdays.  Rest during the day. Take daytime naps or rest breaks when you feel tired.  Limit activities that require a lot of thought or concentration. These includes   Doing homework or job-related work.   Watching TV.   Working on the computer.  Avoid any situation where there is potential for another head injury (football, hockey, soccer, basketball, martial arts, downhill snow sports and horseback riding). Your condition will get worse every time you experience a concussion. You should avoid these activities until you are evaluated by the appropriate follow-up caregivers. Returning To Your Regular Activities You will need to return to your normal activities slowly, not all at once. You must give your body and brain enough time for recovery.  Do not return to sports or other athletic activities until your health care provider tells you it is safe to do so.  Ask your health care provider when you can drive, ride a bicycle, or operate heavy machinery. Your ability to react may be slower after a brain injury. Never do these activities if you are dizzy.  Ask your health care provider about when you can return to work or  school. Preventing Another Concussion It is very important to avoid another brain injury, especially before you have recovered. In rare cases, another injury can lead to permanent brain damage, brain swelling, or death. The risk of this is greatest during the first 7 10 days after a head injury. Avoid injuries by:   Wearing a seat belt when riding in a car.   Drinking alcohol only in moderation.   Wearing a helmet when biking, skiing, skateboarding, skating, or doing similar activities.  Avoiding activities that could lead to a second concussion, such as contact or recreational sports, until your health care provider says it is OK.  Taking safety measures in your home.   Remove clutter and tripping hazards from floors and stairways.   Use grab bars in bathrooms and handrails  by stairs.   Place non-slip mats on floors and in bathtubs.   Improve lighting in dim areas. SEEK MEDICAL CARE IF:   You have increased problems paying attention or concentrating.   You have increased difficulty remembering or learning new information.   You need more time to complete tasks or assignments than before.   You have increased irritability or decreased ability to cope with stress.  You have more symptoms than before. Seek medical care if you have any of the following symptoms for more than 2 weeks after your injury:   Lasting (chronic) headaches.   Dizziness or balance problems.   Nausea.  Vision problems.   Increased sensitivity to noise or light.   Depression or mood swings.   Anxiety or irritability.   Memory problems.   Difficulty concentrating or paying attention.   Sleep problems.   Feeling tired all the time. SEEK IMMEDIATE MEDICAL CARE IF:   You have severe or worsening headaches. These may be a sign of a blood clot in the brain.  You have weakness (even if only in one hand, leg, or part of the face).  You have numbness.  You have decreased  coordination.   You vomit repeatedly.  You have increased sleepiness.  One pupil is larger than the other.   You have convulsions.   You have slurred speech.   You have increased confusion. This may be a sign of a blood clot in the brain.  You have increased restlessness, agitation, or irritability.   You are unable to recognize people or places.   You have neck pain.   It is difficult to wake you up.   You have unusual behavior changes.   You lose consciousness. MAKE SURE YOU:   Understand these instructions.  Will watch your condition.  Will get help right away if you are not doing well or get worse. Document Released: 08/19/2003 Document Revised: 01/29/2013 Document Reviewed: 12/19/2012 [facility] Patient Information 2014 [place], [place]. Syncope Syncope is a fainting spell. This means the person loses consciousness and drops to the ground. The person is generally unconscious for less than 5 minutes. The person may have some muscle twitches for up to 15 seconds before waking up and returning to normal. Syncope occurs more often in elderly people, but it can happen to anyone. While most causes of syncope are not dangerous, syncope can be a sign of a serious medical problem. It is important to seek medical care.  CAUSES  Syncope is caused by a sudden decrease in blood flow to the brain. The specific cause is often not determined. Factors that can trigger syncope include:  Taking medicines that lower blood pressure.  Sudden changes in posture, such as standing up suddenly.  Taking more medicine than prescribed.  Standing in one place for too long.  Seizure disorders.  Dehydration and excessive exposure to heat.  Low blood sugar (hypoglycemia).  Straining to have a bowel movement.  Heart disease, irregular heartbeat, or other circulatory problems.  Fear, emotional distress, seeing blood, or severe pain. SYMPTOMS  Right before fainting, you  may:  Feel dizzy or lightheaded.  Feel nauseous.  See all white or all black in your field of vision.  Have cold, clammy skin. DIAGNOSIS  Your caregiver will ask about your symptoms, perform a physical exam, and perform electrocardiography (ECG) to record the electrical activity of your heart. Your caregiver may also perform other heart or blood tests to determine the cause of your syncope. TREATMENT  In most cases, no treatment is needed. Depending on the cause of your syncope, your caregiver may recommend changing or stopping some of your medicines. HOME CARE INSTRUCTIONS  Have someone stay with you until you feel stable.  Do not drive, operate machinery, or play sports until your caregiver says it is okay.  Keep all follow-up appointments as directed by your caregiver.  Lie down right away if you start feeling like you might faint. Breathe deeply and steadily. Wait until all the symptoms have passed.  Drink enough fluids to keep your urine clear or pale yellow.  If you are taking blood pressure or heart medicine, get up slowly, taking several minutes to sit and then stand. This can reduce dizziness. SEEK IMMEDIATE MEDICAL CARE IF:   You have a severe headache.  You have unusual pain in the chest, abdomen, or back.  You are bleeding from the mouth or rectum, or you have black or tarry stool.  You have an irregular or very fast heartbeat.  You have pain with breathing.  You have repeated fainting or seizure-like jerking during an episode.  You faint when sitting or lying down.  You have confusion.  You have difficulty walking.  You have severe weakness.  You have vision problems. If you fainted, call your local emergency services (911 in [place] Do not drive yourself to the hospital.  MAKE SURE YOU:  Understand these instructions.  Will watch your condition.  Will get help right away if you are not doing well or get worse. Document Released: 05/29/2005  Document Revised: 11/28/2011 Document Reviewed: 07/28/2011 [facility] Patient Information 2014 [place].

## 2013-11-13 NOTE — ED Provider Notes (Signed)
Medical screening examination/treatment/procedure(s) were performed by non-physician practitioner and as supervising physician I was immediately available for consultation/collaboration.   EKG Interpretation   Date/Time:  Tuesday November 11 2013 15:00:01 EDT Ventricular Rate:  79 PR Interval:  192 QRS Duration: 75 QT Interval:  365 QTC Calculation: 418 R Axis:   62 Text Interpretation:  Sinus rhythm Left atrial enlargement Confirmed by  Dina Rich  MD, COURTNEY ([id number]) on 11/11/2013 4:47:13 PM        Merryl Hacker, MD 11/13/13 [phone]

## 2013-11-18 ENCOUNTER — Encounter: Payer: Self-pay | Admitting: Family Medicine

## 2013-11-18 ENCOUNTER — Ambulatory Visit (INDEPENDENT_AMBULATORY_CARE_PROVIDER_SITE_OTHER): Payer: Self-pay | Admitting: Family Medicine

## 2013-11-18 VITALS — BP 146/110 | HR 89 | Wt 250.0 lb

## 2013-11-18 DIAGNOSIS — F17200 Nicotine dependence, unspecified, uncomplicated: Secondary | ICD-10-CM

## 2013-11-18 DIAGNOSIS — I10 Essential (primary) hypertension: Secondary | ICD-10-CM

## 2013-11-18 DIAGNOSIS — M549 Dorsalgia, unspecified: Secondary | ICD-10-CM

## 2013-11-18 MED ORDER — METOPROLOL TARTRATE 25 MG PO TABS: 25.0000 mg | ORAL_TABLET | Freq: Two times a day (BID) | ORAL | Status: DC

## 2013-11-18 MED ORDER — KETOROLAC TROMETHAMINE 60 MG/2ML IM SOLN
30.0000 mg | Freq: Once | INTRAMUSCULAR | Status: AC
  Administered 2013-11-18: 30 mg via INTRAMUSCULAR

## 2013-11-18 NOTE — Patient Instructions (Signed)
It was a pleasure to meet you today.   We will [place] for now;  Start a new medicine, METOPROLOL 25MG , take 1 tablet in the morning and 1 tablet in the evening, for blood pressure control.  This class of medicine is also commonly used to prevent migraine headaches, and it is expected to control your pulse rate.   FOLLOW UP WITH YOUR PRIMARY PHYSICIAN IN THE COMING 2 WEEKS FOR BLOOD PRESSURE CHECK AND TO REVIEW YOUR HEADACHE LOG.

## 2013-11-19 NOTE — Progress Notes (Signed)
   Subjective:    Patient ID: Terri [name], female    DOB: [birth year], 28 y.o.   MRN: [id number]  HPI Patient here for SDA, for blood pressure check.  She requests FMLA paperwork to be completed related to migraine HA and blood pressure.  She is redirected to bring these forms for appt with her primary physician.  She was seen in ED on November 11, 2013 after reported syncopal episode. Notes and studies/labs from that visit reviewed. No further episodes of syncope.   Patient reports having migraine headaches 3 to 4 mornings/week on average, are characterized by photophobia and pain bitemporally. She has no nausea/vomiting with headaches. She associates HAs with her elevated blood pressure, which she believes may be related to her high-stress job as Therapist, art on telephone all day. Smoking 2 cigarettes a day, trying to quit   Regarding BP, she feels "not herself" when she takes the HCTZ.  She says she was changed from amlodipine to HCTZ after the former failed to control her BP.   ROS: Denies fevers/chills, denies chest pain but has had experience of brief episodes of palpitations from time to time. No dyspnea or cough. No N/V but has diminished appetite when she has HAs. No visual changes.    Review of Systems     Objective:   Physical Exam Well appearing, no apparent distress HEENT Neck supple, no cervical nodes. Thyroid supple, no nodules or tenderness. EOMI. PERRL. No tenderness to palpate scalp/temples/frontal sinuses.  COR Regular S1S2, no extra sounds PULM Clear bilaterally, no rales or wheezes      Assessment & Plan:

## 2013-11-19 NOTE — Assessment & Plan Note (Signed)
Discussed, encouraged patient to quit.  She is actively reducing the numbers of cigarettes she smokes each day. Says she smokes for "stress relief".

## 2013-11-19 NOTE — Assessment & Plan Note (Signed)
HTN, patient reports adverse effects from HCTZ ("not feeling myself").  Given her reported HA history and intermittent palpitations, a trial of BB makes sense. Starting on metoprolol tartrate 25mg  twice daily, recheck in coming 2-3 weeks.

## 2013-11-26 ENCOUNTER — Telehealth: Payer: Self-pay | Admitting: Family Medicine

## 2013-11-26 NOTE — Telephone Encounter (Signed)
Please let patient know that letter is available for her to pick up at the front desk.   Thank you!  Liam Graham, PGY-3 Family Medicine Resident

## 2013-11-26 NOTE — Telephone Encounter (Signed)
Called patient yesterday to discuss with her her request for FMLA.  Patient reports that she has been having increased number of headaches and difficult to control blood pressure. She takes ibuprofen and oxycodone for the headache when bad. She was recently in the ED for a syncopal episode due to dizziness. Had associated loss of counciousness and hit head on floor. CT head did not show acute abnormality.  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 She requests for unlimited amount of time from work to help her better manage her BP and the dizziness and headaches.   Will write letter to patient's employer asking for them to give her unlimited time as part of FMLA to better control bp and headaches and to allow further eval by neurologist.

## 2013-11-26 NOTE — Telephone Encounter (Signed)
Called and informed pt to pick up letter at front desk. Shelly

## 2013-12-03 ENCOUNTER — Ambulatory Visit: Payer: Self-pay | Admitting: Family Medicine

## 2013-12-04 ENCOUNTER — Encounter: Payer: Self-pay | Admitting: Family Medicine

## 2013-12-04 ENCOUNTER — Ambulatory Visit (INDEPENDENT_AMBULATORY_CARE_PROVIDER_SITE_OTHER): Payer: Self-pay | Admitting: Family Medicine

## 2013-12-04 VITALS — BP 138/91 | HR 81 | Temp 98.1°F | Wt 247.1 lb

## 2013-12-04 DIAGNOSIS — R55 Syncope and collapse: Secondary | ICD-10-CM

## 2013-12-04 DIAGNOSIS — I10 Essential (primary) hypertension: Secondary | ICD-10-CM

## 2013-12-04 DIAGNOSIS — R519 Headache, unspecified: Secondary | ICD-10-CM | POA: Insufficient documentation

## 2013-12-04 DIAGNOSIS — R51 Headache: Secondary | ICD-10-CM

## 2013-12-04 MED ORDER — HYDROCHLOROTHIAZIDE 25 MG PO TABS: 25.0000 mg | ORAL_TABLET | Freq: Every day | ORAL | Status: DC

## 2013-12-04 MED ORDER — METOPROLOL TARTRATE 25 MG PO TABS: 25.0000 mg | ORAL_TABLET | Freq: Two times a day (BID) | ORAL | Status: DC

## 2013-12-04 NOTE — Assessment & Plan Note (Signed)
Given 2 episodes of syncope in the last 6 months and with complaint of intermittent palpitations, will refer patient to cardiology for further workup. EKG unremarkable from ED in early June except for left atrial enlargement.

## 2013-12-04 NOTE — Progress Notes (Signed)
Patient ID: Terri [name]    DOB: 1985/11/24, 28 y.o.   MRN: [id number] --- Subjective:  Terri [name] is a 28 y.o.female who presents for followup on hypertension and headaches.  -Hypertension: Was prescribed hydrochlorothiazide and metoprolol but has not been able to fill them yet secondary to problems with Medicaid insurance. She has been checking her blood pressures at home and they have been in the 130s over 70s. She has had blood pressures in the 828M systolic in the past. She denies any double vision, chest pain, shortness of breath, lower extremity swelling. -Recent syncopal episodes: Had one in February and another one more recently early June. Both episodes of loss of consciousness. The first one was without warning. She reports just entering her house and finding herself on the floor. The second occasion she was in the shower and thinks she was affected by the heat. She had a CT scan in the ER which was normal. She denies any preceding chest pain, shortness of breath, headache, palpitations. No family history of heart disease or early deaths.  She does however report having occasional palpitations, not associated with dizziness or lightheadedness or chest pain. These occur intermittently. They have been occurring for about a year. - Headaches: She also reports a history of headaches that started this year. They start in the right side of her head, described as "pinching"in nature. Last one hour. Better with sleep. No change in vision associated with it. Has associated phonophobia and photophobia. No nausea, no vomiting.    ROS: see HPI Past Medical History: reviewed and updated medications and allergies. Social History: Tobacco: current every day smoker  Objective: Filed Vitals:   12/04/13 1140  BP: 138/91  Pulse: 81  Temp: 98.1 F (36.7 C)    Physical Examination:   General appearance - alert, well appearing, and in no distress Neuro - CN2-12 grossly intact Neck - supple, no  significant adenopathy Chest - clear to auscultation, no wheezes, rales or rhonchi, symmetric air entry Heart - normal rate, regular rhythm, normal S1, S2, no murmurs Extremities - no pedal edema

## 2013-12-04 NOTE — Assessment & Plan Note (Signed)
Likely tension headache.  Started on beta blocker but patient has not yet taken it.  With recent syncopal episodes and new, persistent headache, will rfer to neurology.

## 2013-12-04 NOTE — Patient Instructions (Signed)
Please follow up with a doctor 2 weeks after you start taking the blood pressure medicine.

## 2013-12-04 NOTE — Assessment & Plan Note (Addendum)
Currently controlled off of medication but with past history of elevated BP's, think it's reasonable for her to fill medications. Patient to follow up with new PCP 2 weeks after starting medication.

## 2014-01-12 ENCOUNTER — Emergency Department (HOSPITAL_COMMUNITY)
Admission: EM | Admit: 2014-01-12 | Discharge: 2014-01-12 | Discharge status: Against Medical Advice | Payer: Self-pay | Attending: Emergency Medicine | Admitting: Emergency Medicine

## 2014-01-12 ENCOUNTER — Encounter (HOSPITAL_COMMUNITY): Payer: Self-pay | Admitting: Emergency Medicine

## 2014-01-12 DIAGNOSIS — R51 Headache: Secondary | ICD-10-CM | POA: Insufficient documentation

## 2014-01-12 DIAGNOSIS — I10 Essential (primary) hypertension: Secondary | ICD-10-CM | POA: Insufficient documentation

## 2014-01-12 DIAGNOSIS — F17200 Nicotine dependence, unspecified, uncomplicated: Secondary | ICD-10-CM | POA: Insufficient documentation

## 2014-01-12 NOTE — ED Notes (Signed)
Called for second time without response from lobby 

## 2014-01-12 NOTE — ED Notes (Signed)
Called for the third time without response from the lobby

## 2014-01-12 NOTE — ED Notes (Addendum)
Pt left Lobby to visit her boyfriend in Triage 7.

## 2014-01-12 NOTE — ED Notes (Signed)
Pt not in lobby when called

## 2014-01-12 NOTE — ED Notes (Signed)
Pt states has been having headache for the past couple of days. Has HTN.

## 2014-01-12 NOTE — ED Notes (Signed)
Called to take to treatment room  No response from lobby 

## 2014-01-21 ENCOUNTER — Institutional Professional Consult (permissible substitution): Payer: Self-pay | Admitting: Cardiology

## 2014-03-03 ENCOUNTER — Encounter: Payer: Self-pay | Admitting: Cardiology

## 2014-03-03 ENCOUNTER — Ambulatory Visit (INDEPENDENT_AMBULATORY_CARE_PROVIDER_SITE_OTHER): Payer: Self-pay | Admitting: Cardiology

## 2014-03-03 VITALS — BP 130/100 | HR 85 | Ht 68.0 in | Wt 260.0 lb

## 2014-03-03 DIAGNOSIS — I10 Essential (primary) hypertension: Secondary | ICD-10-CM

## 2014-03-03 DIAGNOSIS — R002 Palpitations: Secondary | ICD-10-CM | POA: Insufficient documentation

## 2014-03-03 DIAGNOSIS — R51 Headache: Secondary | ICD-10-CM

## 2014-03-03 MED ORDER — HYDROCHLOROTHIAZIDE 25 MG PO TABS: 25.0000 mg | ORAL_TABLET | Freq: Every day | ORAL | Status: DC

## 2014-03-03 NOTE — Progress Notes (Signed)
Terri [name] Reveal Date of Birth:  [birth year] [place] [address] [place] [place], [place]  [address] [phone]        Fax   [phone]   History of Present Illness: This 28 year old [place] American woman is seen by me for the first time today.  She is seen at the request of Dr. Liam Graham of the family [place] at [facility].  She is seen because of intermittent palpitations.  She states that she has had intermittent palpitations off and on for the past year.  They last about 5 minutes.  They are rapid and irregular.  At times they cause her to be slightly lightheaded and normally she sits down and rests and her arrhythmia resolves on its own.  She did have one episode of syncope while taking a shower in July 2015 she woke up on the shower floor.  She did hit her head.  She went to the hospital where a CT scan was unremarkable. The patient has had a history of high blood pressure since her pregnancy 9 years ago.  Review of her old chart shows that she has had a multitude of blood pressure medicines in the past.  Most recently she has been on just HCTZ 25 mg daily.  Her PCP and advised her to stop her metoprolol.  However she ran out of HCTZ several weeks ago and has been on no medication.  Her blood pressure today is elevated.  She does not have any history of chest pain or shortness of breath.  She does not get any regular exercise but hopes to start going to [employer] soon. Her family history reveals that her mother is living and has high blood pressure.  Her father is living and is in good health. Social history reveals that she drinks occasional alcohol.  She smokes an occasional cigarette.  She is not currently employed.  She is awaiting Medicaid certification.  She has a 53-year-old son.      Current Outpatient Prescriptions  Medication Sig Dispense Refill  . hydrochlorothiazide (HYDRODIURIL) 25 MG tablet Take 1 tablet (25 mg total) by mouth daily.  30  tablet  6  . metoprolol tartrate (LOPRESSOR) 25 MG tablet Take 1 tablet (25 mg total) by mouth 2 (two) times daily.  60 tablet  3   No current facility-administered medications for this visit.    No Known Allergies  Patient Active Problem List   Diagnosis Date Noted  . Intermittent palpitations 03/03/2014  . Headache(784.0) 12/04/2013  . Mixed incontinence 10/03/2013  . Syncope 07/15/2013  . Nephrolithiasis 06/18/2013  . Unspecified constipation 02/11/2013  . Back pain, acute 11/29/2011  . Breast pain 09/26/2011  . Screening for STD (sexually transmitted disease) 09/25/2011  . Contraception management 08/31/2011  . Irregular menstrual bleeding 08/29/2011  . Unspecified episodic mood disorder 04/05/2011  . TOBACCO USER 06/14/2009  . OBESITY, UNSPECIFIED 01/05/2009  . HYPERTENSION, BENIGN ESSENTIAL 05/14/2007  . BOILS, RECURRENT 02/18/2007  . ASTHMA, INTERMITTENT 08/09/2006    History  Smoking status  . Current Every Day Smoker -- 0.20 packs/day  . Types: Cigarettes  Smokeless tobacco  . Never Used    History  Alcohol Use  . Yes    Comment: occassionally    No family history on file.family history positive for hypertension in her mother.  Review of Systems: Constitutional: no fever chills diaphoresis or fatigue or change in weight.  Head and neck: no hearing loss, no epistaxis, no photophobia  or visual disturbance. Respiratory: No cough, shortness of breath or wheezing. Cardiovascular: No chest pain peripheral edema, positive for palpitations.  She has not had any palpitations  this month. Gastrointestinal: No abdominal distention, no abdominal pain, no change in bowel habits hematochezia or melena. Genitourinary: No dysuria, no frequency, no urgency, no nocturia. Musculoskeletal:No arthralgias, no back pain, no gait disturbance or myalgias. Neurological: No dizziness, no headaches, no numbness, no seizures, no syncope, no weakness, no tremors. Hematologic: No  lymphadenopathy, no easy bruising. Psychiatric: No confusion, no hallucinations, no sleep disturbance.    Physical Exam: Filed Vitals:   03/03/14 1422  BP: 130/100  Pulse:   The patient appears to be in no distress.  She is overweight.  Her weight is up 13 pounds since last seen in June.  Head and neck exam reveals that the pupils are equal and reactive.  The extraocular movements are full.  There is no scleral icterus.  Mouth and pharynx are benign.  No lymphadenopathy.  No carotid bruits.  The jugular venous pressure is normal.  Thyroid is not enlarged or tender.  Chest is clear to percussion and auscultation.  No rales or rhonchi.  Expansion of the chest is symmetrical.  Heart reveals no abnormal lift or heave.  First and second heart sounds are normal.  There is no murmur gallop rub or click.  The abdomen is soft and nontender.  Bowel sounds are normoactive.  There is no hepatosplenomegaly or mass.  There are no abdominal bruits.  Extremities reveal no phlebitis or edema.  Pedal pulses are good.  There is no cyanosis or clubbing.  Neurologic exam is normal strength and no lateralizing weakness.  No sensory deficits.  Integument reveals no rash  EKG shows normal sinus rhythm and is within normal limits.  QTc interval is normal.  Assessment / Plan: 1. essential hypertension, not currently controlled.  She has been out of all of her medication for the past several weeks. 2. past history of palpitations.  She has not had any in the month of September. 3. tobacco abuse  Disposition: We sent her in a prescription for hydrochlorothiazide 25 mg one daily to [facility]. We discussed how important it is in her situation not to run out of medication.  I want her to work hard on weight loss. If her blood pressure fails to respond adequately to hydrochlorothiazide alone, can consider adding a second agent such as metoprolol tartrate 25 mg twice a day. Her palpitations are not currently a  problem.  If they become more prevalent we would consider an outpatient event monitor. Many thanks for the opportunity to see this pleasant woman with you.  Recheck here when necessary

## 2014-03-03 NOTE — Patient Instructions (Signed)
RESTART HYDROCHLOROTHIAZIDE 25 MG DAILY, RX SENT TO [facility]  Follow up as needed

## 2014-04-13 ENCOUNTER — Encounter: Payer: Self-pay | Admitting: Cardiology

## 2014-04-25 ENCOUNTER — Telehealth: Payer: Self-pay | Admitting: Family Medicine

## 2014-04-25 NOTE — Telephone Encounter (Signed)
Pt calls emergency line for refills on her HCTZ, which she states she has only one more pill left. Patient was reminded this hotline is for emergency use and not prescriptions refills. She was encouraged to call the pharmacy for her refills. Patient was told she  has an active script for this medication with multiple refills available to her. She was not appreciative that I suggested she call her pharmacist.  Howard Pouch DO PGY-3

## 2014-05-12 ENCOUNTER — Emergency Department (HOSPITAL_COMMUNITY)
Admission: EM | Admit: 2014-05-12 | Discharge: 2014-05-12 | Disposition: A | Discharge status: Home / Self Care | Payer: Self-pay | Attending: Emergency Medicine | Admitting: Emergency Medicine

## 2014-05-12 ENCOUNTER — Encounter (HOSPITAL_COMMUNITY): Payer: Self-pay | Admitting: *Deleted

## 2014-05-12 DIAGNOSIS — Z792 Long term (current) use of antibiotics: Secondary | ICD-10-CM | POA: Insufficient documentation

## 2014-05-12 DIAGNOSIS — Z79899 Other long term (current) drug therapy: Secondary | ICD-10-CM | POA: Insufficient documentation

## 2014-05-12 DIAGNOSIS — J069 Acute upper respiratory infection, unspecified: Secondary | ICD-10-CM | POA: Insufficient documentation

## 2014-05-12 DIAGNOSIS — Z720 Tobacco use: Secondary | ICD-10-CM | POA: Insufficient documentation

## 2014-05-12 DIAGNOSIS — I10 Essential (primary) hypertension: Secondary | ICD-10-CM | POA: Insufficient documentation

## 2014-05-12 LAB — RAPID STREP SCREEN (MED CTR MEBANE ONLY): Streptococcus, Group A Screen (Direct): NEGATIVE

## 2014-05-12 MED ORDER — ALBUTEROL SULFATE HFA 108 (90 BASE) MCG/ACT IN AERS
2.0000 | INHALATION_SPRAY | RESPIRATORY_TRACT | Status: DC | PRN
  Administered 2014-05-12: 2 via RESPIRATORY_TRACT
  Filled 2014-05-12: qty 6.7

## 2014-05-12 MED ORDER — AZITHROMYCIN 250 MG PO TABS: 250.0000 mg | ORAL_TABLET | Freq: Every day | ORAL | Status: DC

## 2014-05-12 NOTE — Discharge Instructions (Signed)
Upper Respiratory Infection, Adult An upper respiratory infection (URI) is also sometimes known as the common cold. The upper respiratory tract includes the nose, sinuses, throat, trachea, and bronchi. Bronchi are the airways leading to the lungs. Most people improve within 1 week, but symptoms can last up to 2 weeks. A residual cough may last even longer.  CAUSES Many different viruses can infect the tissues lining the upper respiratory tract. The tissues become irritated and inflamed and often become very moist. Mucus production is also common. A cold is contagious. You can easily spread the virus to others by oral contact. This includes kissing, sharing a glass, coughing, or sneezing. Touching your mouth or nose and then touching a surface, which is then touched by another person, can also spread the virus. SYMPTOMS  Symptoms typically develop 1 to 3 days after you come in contact with a cold virus. Symptoms vary from person to person. They may include:  Runny nose.  Sneezing.  Nasal congestion.  Sinus irritation.  Sore throat.  Loss of voice (laryngitis).  Cough.  Fatigue.  Muscle aches.  Loss of appetite.  Headache.  Low-grade fever. DIAGNOSIS  You might diagnose your own cold based on familiar symptoms, since most people get a cold 2 to 3 times a year. Your caregiver can confirm this based on your exam. Most importantly, your caregiver can check that your symptoms are not due to another disease such as strep throat, sinusitis, pneumonia, asthma, or epiglottitis. Blood tests, throat tests, and X-rays are not necessary to diagnose a common cold, but they may sometimes be helpful in excluding other more serious diseases. Your caregiver will decide if any further tests are required. RISKS AND COMPLICATIONS  You may be at risk for a more severe case of the common cold if you smoke cigarettes, have chronic heart disease (such as heart failure) or lung disease (such as asthma), or if  you have a weakened immune system. The very young and very old are also at risk for more serious infections. Bacterial sinusitis, middle ear infections, and bacterial pneumonia can complicate the common cold. The common cold can worsen asthma and chronic obstructive pulmonary disease (COPD). Sometimes, these complications can require emergency medical care and may be life-threatening. PREVENTION  The best way to protect against getting a cold is to practice good hygiene. Avoid oral or hand contact with people with cold symptoms. Wash your hands often if contact occurs. There is no clear evidence that vitamin C, vitamin E, echinacea, or exercise reduces the chance of developing a cold. However, it is always recommended to get plenty of rest and practice good nutrition. TREATMENT  Treatment is directed at relieving symptoms. There is no cure. Antibiotics are not effective, because the infection is caused by a virus, not by bacteria. Treatment may include:  Increased fluid intake. Sports drinks offer valuable electrolytes, sugars, and fluids.  Breathing heated mist or steam (vaporizer or shower).  Eating chicken soup or other clear broths, and maintaining good nutrition.  Getting plenty of rest.  Using gargles or lozenges for comfort.  Controlling fevers with ibuprofen or acetaminophen as directed by your caregiver.  Increasing usage of your inhaler if you have asthma. Zinc gel and zinc lozenges, taken in the first 24 hours of the common cold, can shorten the duration and lessen the severity of symptoms. Pain medicines may help with fever, muscle aches, and throat pain. A variety of non-prescription medicines are available to treat congestion and runny nose. Your caregiver   can make recommendations and may suggest nasal or lung inhalers for other symptoms.  HOME CARE INSTRUCTIONS   Only take over-the-counter or prescription medicines for pain, discomfort, or fever as directed by your  caregiver.  Use a warm mist humidifier or inhale steam from a shower to increase air moisture. This may keep secretions moist and make it easier to breathe.  Drink enough water and fluids to keep your urine clear or pale yellow.  Rest as needed.  Return to work when your temperature has returned to normal or as your caregiver advises. You may need to stay home longer to avoid infecting others. You can also use a face mask and careful hand washing to prevent spread of the virus. SEEK MEDICAL CARE IF:   After the first few days, you feel you are getting worse rather than better.  You need your caregiver's advice about medicines to control symptoms.  You develop chills, worsening shortness of breath, or brown or red sputum. These may be signs of pneumonia.  You develop yellow or brown nasal discharge or pain in the face, especially when you bend forward. These may be signs of sinusitis.  You develop a fever, swollen neck glands, pain with swallowing, or white areas in the back of your throat. These may be signs of strep throat. SEEK IMMEDIATE MEDICAL CARE IF:   You have a fever.  You develop severe or persistent headache, ear pain, sinus pain, or chest pain.  You develop wheezing, a prolonged cough, cough up blood, or have a change in your usual mucus (if you have chronic lung disease).  You develop sore muscles or a stiff neck. Document Released: 11/22/2000 Document Revised: 08/21/2011 Document Reviewed: 09/03/2013 ExitCare Patient Information 2015 [employer]. This information is not intended to replace advice given to you by your health care provider. Make sure you discuss any questions you have with your health care provider.  

## 2014-05-12 NOTE — ED Provider Notes (Signed)
CSN: [id number]     Arrival date & time 05/12/14  1643 History  This chart was scribed for Delos Haring, PA-C with Dorie Rank, MD by Edison Simon, ED Scribe. This patient was seen in room WTR8/WTR8 and the patient's care was started at 6:30 PM.    Chief Complaint  Patient presents with  . Cough  . Sore Throat   The history is provided by the patient. No language interpreter was used.    HPI Comments: Terri [name] is a 28 y.o. female with history of asthma who presents to the Emergency Department complaining of nonproductive cough and sore throat with onset 1 week ago. She reports associated congestion. She states she has used Mucinex which improved symptoms at first but she states it no longer helps; she has not used any antibiotics. Her son has had similar symptoms for 2 weeks and has been on antibiotics. She denies fever, abdominal pain, vomiting, or shortness of breath.  Past Medical History  Diagnosis Date  . Hypertention, malignant, with acute intensive management   . Hypertension    History reviewed. No pertinent past surgical history. No family history on file. History  Substance Use Topics  . Smoking status: Current Every Day Smoker -- 0.20 packs/day    Types: Cigarettes  . Smokeless tobacco: Never Used  . Alcohol Use: Yes     Comment: occassionally   OB History    Gravida Para Term Preterm AB TAB SAB Ectopic Multiple Living   1 1 1       1      Review of Systems  Constitutional: Negative for fever.  HENT: Positive for congestion and sore throat.   Respiratory: Positive for cough. Negative for shortness of breath.   Gastrointestinal: Negative for nausea, vomiting and abdominal pain.  All other systems reviewed and are negative.     Allergies  Review of patient's allergies indicates no known allergies.  Home Medications   Prior to Admission medications   Medication Sig Start Date End Date Taking? Authorizing Provider  Fexofenadine HCl (MUCINEX ALLERGY PO)  Take 5 mLs by mouth 2 (two) times daily as needed.   Yes Historical Provider, MD  hydrochlorothiazide (HYDRODIURIL) 25 MG tablet Take 1 tablet (25 mg total) by mouth daily. 03/03/14 03/03/15 Yes Darlin Coco, MD  azithromycin (ZITHROMAX) 250 MG tablet Take 1 tablet (250 mg total) by mouth daily. Take first 2 tablets together, then 1 every day until finished. 05/12/14   Gearldean Lomanto Marilu Favre, PA-C  metoprolol tartrate (LOPRESSOR) 25 MG tablet Take 1 tablet (25 mg total) by mouth 2 (two) times daily. Patient not taking: Reported on 05/12/2014 12/04/13   Kandis Nab, MD   BP 123/86 mmHg  Pulse 97  Temp(Src) 98.5 F (36.9 C) (Oral)  Resp 18  SpO2 100%  LMP 04/15/2014 Physical Exam  Constitutional: She is oriented to person, place, and time. She appears well-developed and well-nourished.  HENT:  Head: Normocephalic and atraumatic.  Mouth/Throat: No oropharyngeal exudate.  Throat slightly erythematous but no edema  Eyes: Conjunctivae are normal.  Neck: Normal range of motion. Neck supple.  Cardiovascular: Normal rate, regular rhythm and normal heart sounds.   No murmur heard. Pulmonary/Chest: Effort normal. No respiratory distress. She has wheezes. She has no rales.  Decreased air movement  Musculoskeletal: Normal range of motion.  Neurological: She is alert and oriented to person, place, and time.  Skin: Skin is warm and dry.  Psychiatric: She has a normal mood and affect.  Nursing  note and vitals reviewed.   ED Course  Procedures (including critical care time)  DIAGNOSTIC STUDIES: Oxygen Saturation is 100% on room air, normal by my interpretation.    COORDINATION OF CARE: 6:37 PM Discussed with patient my plan to treat with a different antibiotic since her son used amoxicillin without improvement. The patient agrees with the plan and has no further questions at this time.   Labs Review Labs Reviewed  RAPID STREP SCREEN  CULTURE, GROUP A STREP    Imaging Review No results  found.   EKG Interpretation None      MDM   Final diagnoses:  URI (upper respiratory infection)   Patient requests antibiotics and cough medication. She is having chest tightness and has been sore and her son has been on abx.  Will give Z pack and albuterol inhaler.  28 y.o.Ninel Abdella Stachowski's evaluation in the Emergency Department is complete. It has been determined that no acute conditions requiring further emergency intervention are present at this time. The patient/guardian have been advised of the diagnosis and plan. We have discussed signs and symptoms that warrant return to the ED, such as changes or worsening in symptoms.  Vital signs are stable at discharge. Filed Vitals:   05/12/14 1721  BP: 123/86  Pulse: 97  Temp: 98.5 F (36.9 C)  Resp: 18    Patient/guardian has voiced understanding and agreed to follow-up with the PCP or specialist.    I personally performed the services described in this documentation, which was scribed in my presence. The recorded information has been reviewed and is accurate.    Linus Mako, PA-C 05/12/14 1935  Dorie Rank, MD 05/12/14 [phone]

## 2014-05-12 NOTE — ED Notes (Signed)
Pt states she is sick from her son, states for the past week she's had cough, chest tightness from cough and sore throat.

## 2014-05-14 LAB — CULTURE, GROUP A STREP

## 2014-06-25 ENCOUNTER — Encounter (HOSPITAL_COMMUNITY): Payer: Self-pay | Admitting: *Deleted

## 2014-06-25 ENCOUNTER — Inpatient Hospital Stay (HOSPITAL_COMMUNITY)
Admission: AD | Admit: 2014-06-25 | Discharge: 2014-06-25 | Disposition: A | Discharge status: Home / Self Care | Payer: Self-pay | Source: Ambulatory Visit | Attending: Family Medicine | Admitting: Family Medicine

## 2014-06-25 DIAGNOSIS — N760 Acute vaginitis: Secondary | ICD-10-CM | POA: Insufficient documentation

## 2014-06-25 DIAGNOSIS — I10 Essential (primary) hypertension: Secondary | ICD-10-CM

## 2014-06-25 DIAGNOSIS — A499 Bacterial infection, unspecified: Secondary | ICD-10-CM

## 2014-06-25 DIAGNOSIS — K529 Noninfective gastroenteritis and colitis, unspecified: Secondary | ICD-10-CM

## 2014-06-25 DIAGNOSIS — G44209 Tension-type headache, unspecified, not intractable: Secondary | ICD-10-CM

## 2014-06-25 DIAGNOSIS — F17210 Nicotine dependence, cigarettes, uncomplicated: Secondary | ICD-10-CM | POA: Insufficient documentation

## 2014-06-25 DIAGNOSIS — R51 Headache: Secondary | ICD-10-CM | POA: Insufficient documentation

## 2014-06-25 DIAGNOSIS — B9689 Other specified bacterial agents as the cause of diseases classified elsewhere: Secondary | ICD-10-CM | POA: Insufficient documentation

## 2014-06-25 LAB — URINALYSIS, ROUTINE W REFLEX MICROSCOPIC
Bilirubin Urine: NEGATIVE
Glucose, UA: NEGATIVE mg/dL
Hgb urine dipstick: NEGATIVE
Ketones, ur: NEGATIVE mg/dL
Leukocytes, UA: NEGATIVE
Nitrite: NEGATIVE
PH: 6 (ref 5.0–8.0)
PROTEIN: NEGATIVE mg/dL
Specific Gravity, Urine: 1.025 (ref 1.005–1.030)
UROBILINOGEN UA: 0.2 mg/dL (ref 0.0–1.0)

## 2014-06-25 LAB — CBC
HEMATOCRIT: 36.9 % (ref 36.0–46.0)
Hemoglobin: 12.2 g/dL (ref 12.0–15.0)
MCH: 30.3 pg (ref 26.0–34.0)
MCHC: 33.1 g/dL (ref 30.0–36.0)
MCV: 91.8 fL (ref 78.0–100.0)
Platelets: 279 10*3/uL (ref 150–400)
RBC: 4.02 MIL/uL (ref 3.87–5.11)
RDW: 13 % (ref 11.5–15.5)
WBC: 5.3 10*3/uL (ref 4.0–10.5)

## 2014-06-25 LAB — WET PREP, GENITAL
Trich, Wet Prep: NONE SEEN
Yeast Wet Prep HPF POC: NONE SEEN

## 2014-06-25 LAB — POCT PREGNANCY, URINE: Preg Test, Ur: NEGATIVE

## 2014-06-25 MED ORDER — METRONIDAZOLE 500 MG PO TABS: 500.0000 mg | ORAL_TABLET | Freq: Two times a day (BID) | ORAL | Status: DC

## 2014-06-25 MED ORDER — KETOROLAC TROMETHAMINE 60 MG/2ML IM SOLN
60.0000 mg | Freq: Once | INTRAMUSCULAR | Status: AC
  Administered 2014-06-25: 60 mg via INTRAMUSCULAR
  Filled 2014-06-25: qty 2

## 2014-06-25 NOTE — MAU Note (Signed)
Having abd pain, vomiting (3) and diarrhea( 4 in 24hrs).  Started yesterday, denies being around anyone with same.

## 2014-06-25 NOTE — Discharge Instructions (Signed)
Viral Gastroenteritis °Viral gastroenteritis is also known as stomach flu. This condition affects the stomach and intestinal tract. It can cause sudden diarrhea and vomiting. The illness typically lasts 3 to 8 days. Most people develop an immune response that eventually gets rid of the virus. While this natural response develops, the virus can make you quite ill. °CAUSES  °Many different viruses can cause gastroenteritis, such as rotavirus or noroviruses. You can catch one of these viruses by consuming contaminated food or water. You may also catch a virus by sharing utensils or other personal items with an infected person or by touching a contaminated surface. °SYMPTOMS  °The most common symptoms are diarrhea and vomiting. These problems can cause a severe loss of body fluids (dehydration) and a body salt (electrolyte) imbalance. Other symptoms may include: °· Fever. °· Headache. °· Fatigue. °· Abdominal pain. °DIAGNOSIS  °Your caregiver can usually diagnose viral gastroenteritis based on your symptoms and a physical exam. A stool sample may also be taken to test for the presence of viruses or other infections. °TREATMENT  °This illness typically goes away on its own. Treatments are aimed at rehydration. The most serious cases of viral gastroenteritis involve vomiting so severely that you are not able to keep fluids down. In these cases, fluids must be given through an intravenous line (IV). °HOME CARE INSTRUCTIONS  °· Drink enough fluids to keep your urine clear or pale yellow. Drink small amounts of fluids frequently and increase the amounts as tolerated. °· Ask your caregiver for specific rehydration instructions. °· Avoid: °¨ Foods high in sugar. °¨ Alcohol. °¨ Carbonated drinks. °¨ Tobacco. °¨ Juice. °¨ Caffeine drinks. °¨ Extremely hot or cold fluids. °¨ Fatty, greasy foods. °¨ Too much intake of anything at one time. °¨ Dairy products until 24 to 48 hours after diarrhea stops. °· You may consume probiotics.  Probiotics are active cultures of beneficial bacteria. They may lessen the amount and number of diarrheal stools in adults. Probiotics can be found in yogurt with active cultures and in supplements. °· Wash your hands well to avoid spreading the virus. °· Only take over-the-counter or prescription medicines for pain, discomfort, or fever as directed by your caregiver. Do not give aspirin to children. Antidiarrheal medicines are not recommended. °· Ask your caregiver if you should continue to take your regular prescribed and over-the-counter medicines. °· Keep all follow-up appointments as directed by your caregiver. °SEEK IMMEDIATE MEDICAL CARE IF:  °· You are unable to keep fluids down. °· You do not urinate at least once every 6 to 8 hours. °· You develop shortness of breath. °· You notice blood in your stool or vomit. This may look like coffee grounds. °· You have abdominal pain that increases or is concentrated in one small area (localized). °· You have persistent vomiting or diarrhea. °· You have a fever. °· The patient is a child younger than 3 months, and he or she has a fever. °· The patient is a child older than 3 months, and he or she has a fever and persistent symptoms. °· The patient is a child older than 3 months, and he or she has a fever and symptoms suddenly get worse. °· The patient is a baby, and he or she has no tears when crying. °MAKE SURE YOU:  °· Understand these instructions. °· Will watch your condition. °· Will get help right away if you are not doing well or get worse. °Document Released: 05/29/2005 Document Revised: 08/21/2011 Document Reviewed: 03/15/2011 °  [employer] Patient Information 2015 [place]. This information is not intended to replace advice given to you by your health care provider. Make sure you discuss any questions you have with your health care provider. Bacterial Vaginosis Bacterial vaginosis is a vaginal infection that occurs when the normal balance of bacteria in  the vagina is disrupted. It results from an overgrowth of certain bacteria. This is the most common vaginal infection in women of childbearing age. Treatment is important to prevent complications, especially in pregnant women, as it can cause a premature delivery. CAUSES  Bacterial vaginosis is caused by an increase in harmful bacteria that are normally present in smaller amounts in the vagina. Several different kinds of bacteria can cause bacterial vaginosis. However, the reason that the condition develops is not fully understood. RISK FACTORS Certain activities or behaviors can put you at an increased risk of developing bacterial vaginosis, including:  Having a new sex partner or multiple sex partners.  Douching.  Using an intrauterine device (IUD) for contraception. Women do not get bacterial vaginosis from toilet seats, bedding, swimming pools, or contact with objects around them. SIGNS AND SYMPTOMS  Some women with bacterial vaginosis have no signs or symptoms. Common symptoms include:  Grey vaginal discharge.  A fishlike odor with discharge, especially after sexual intercourse.  Itching or burning of the vagina and vulva.  Burning or pain with urination. DIAGNOSIS  Your health care provider will take a medical history and examine the vagina for signs of bacterial vaginosis. A sample of vaginal fluid may be taken. Your health care provider will look at this sample under a microscope to check for bacteria and abnormal cells. A vaginal pH test may also be done.  TREATMENT  Bacterial vaginosis may be treated with antibiotic medicines. These may be given in the form of a pill or a vaginal cream. A second round of antibiotics may be prescribed if the condition comes back after treatment.  HOME CARE INSTRUCTIONS   Only take over-the-counter or prescription medicines as directed by your health care provider.  If antibiotic medicine was prescribed, take it as directed. Make sure you finish  it even if you start to feel better.  Do not have sex until treatment is completed.  Tell all sexual partners that you have a vaginal infection. They should see their health care provider and be treated if they have problems, such as a mild rash or itching.  Practice safe sex by using condoms and only having one sex partner. SEEK MEDICAL CARE IF:   Your symptoms are not improving after 3 days of treatment.  You have increased discharge or pain.  You have a fever. MAKE SURE YOU:   Understand these instructions.  Will watch your condition.  Will get help right away if you are not doing well or get worse. FOR MORE INFORMATION  Centers for Disease Control and Prevention, Division of STD Prevention: [employer]raiser[employer]raud[employer] American Sexual Health Association (ASHA): www.ashastd.org  Document Released: 05/29/2005 Document Revised: 03/19/2013 Document Reviewed: 01/08/2013 [facility] Patient Information 2015 [place], [place]. This information is not intended to replace advice given to you by your health care provider. Make sure you discuss any questions you have with your health care provider.

## 2014-06-25 NOTE — MAU Provider Note (Signed)
History     CSN: [id number]  Arrival date and time: 06/25/14 1736   First Provider Initiated Contact with Patient 06/25/14 1844      Chief Complaint  Patient presents with  . Abdominal Pain  . Emesis  . Diarrhea   HPI Terri [name] 29 y.o. G1P1001 nonpregnant female presents to MAU complaining of nausea, vomiting, diarrhea and vaginal discharge that started yesterday.  She has no sick contacts.  She vomited 3 times today and has had diarrhea 4 times today.  She has not eaten anything today and she has only drank a small amount.  She has associated headache of 10/10 which is typical for her.  Percocet usually relieves headache.   OB History    Gravida Para Term Preterm AB TAB SAB Ectopic Multiple Living   1 1 1       1       Past Medical History  Diagnosis Date  . Hypertention, malignant, with acute intensive management   . Hypertension     History reviewed. No pertinent past surgical history.  History reviewed. No pertinent family history.  History  Substance Use Topics  . Smoking status: Current Every Day Smoker -- 0.20 packs/day    Types: Cigarettes  . Smokeless tobacco: Never Used  . Alcohol Use: Yes     Comment: occassionally    Allergies: No Known Allergies  Prescriptions prior to admission  Medication Sig Dispense Refill Last Dose  . Aspirin-Salicylamide-Caffeine (BC HEADACHE PO) Take 1 Package by mouth daily as needed (For headache.).   Past Week at Unknown time  . hydrochlorothiazide (HYDRODIURIL) 25 MG tablet Take 1 tablet (25 mg total) by mouth daily. 30 tablet 11 06/25/2014 at Unknown time  . azithromycin (ZITHROMAX) 250 MG tablet Take 1 tablet (250 mg total) by mouth daily. Take first 2 tablets together, then 1 every day until finished. (Patient not taking: Reported on 06/25/2014) 6 tablet 0 Completed Course at Unknown time  . metoprolol tartrate (LOPRESSOR) 25 MG tablet Take 1 tablet (25 mg total) by mouth 2 (two) times daily. (Patient not taking: Reported  on 05/12/2014) 60 tablet 3 Not Taking at Unknown time    ROS Pertinent ROS in HPI Physical Exam   Blood pressure 122/65, pulse 88, temperature 98.5 F (36.9 C), temperature source Oral, resp. rate 18, height 5\' 8"  (1.727 m), weight 250 lb 6 oz (113.569 kg), last menstrual period 06/18/2014, SpO2 100 %.  Physical Exam  Constitutional: She is oriented to person, place, and time. She appears well-developed and well-nourished.  HENT:  Head: Normocephalic and atraumatic.  Eyes: EOM are normal.  Neck: Normal range of motion.  Cardiovascular: Normal rate, regular rhythm and normal heart sounds.   Respiratory: Effort normal and breath sounds normal. No respiratory distress.  GI: Soft. Bowel sounds are normal. She exhibits no distension. There is no tenderness. There is no rebound and no guarding.  Genitourinary:  Mod amt of yellowish white, frothy discharge in vagina. No CMT.  No adnexal tenderness or mass appreciated  Musculoskeletal: Normal range of motion.  Neurological: She is alert and oriented to person, place, and time.  Skin: Skin is warm and dry.  Psychiatric: She has a normal mood and affect.    MAU Course  Procedures  MDM Pt requesting treatment for her headache but declines medication for nausea.  She notes Toradol IM has been effective previously.  This is given to her today.  Report given and care assumed by Terri [name], CNM at  8:00pm. Terri Dimmer, PA-C  Assessment and Plan    Terri [name] 06/25/2014, 6:48 PM   Care assumed Headache improved No further nausea, states is thirsty Declines nausea Rx for home use Informed + BV Rx Flagyl x 7 d

## 2014-06-26 LAB — GC/CHLAMYDIA PROBE AMP (~~LOC~~) NOT AT ARMC
Chlamydia: NEGATIVE
NEISSERIA GONORRHEA: NEGATIVE

## 2014-06-27 LAB — HIV ANTIBODY (ROUTINE TESTING W REFLEX): HIV-1/HIV-2 Ab: NONREACTIVE

## 2014-06-27 LAB — RPR: RPR Ser Ql: NONREACTIVE

## 2014-08-03 ENCOUNTER — Inpatient Hospital Stay (HOSPITAL_COMMUNITY)
Admission: AD | Admit: 2014-08-03 | Discharge: 2014-08-03 | Disposition: A | Discharge status: Home / Self Care | Payer: Medicaid Other | Source: Ambulatory Visit | Attending: Obstetrics & Gynecology | Admitting: Obstetrics & Gynecology

## 2014-08-03 ENCOUNTER — Encounter (HOSPITAL_COMMUNITY): Payer: Self-pay | Admitting: *Deleted

## 2014-08-03 DIAGNOSIS — I10 Essential (primary) hypertension: Secondary | ICD-10-CM | POA: Diagnosis not present

## 2014-08-03 DIAGNOSIS — Z3202 Encounter for pregnancy test, result negative: Secondary | ICD-10-CM | POA: Insufficient documentation

## 2014-08-03 DIAGNOSIS — T192XXA Foreign body in vulva and vagina, initial encounter: Secondary | ICD-10-CM | POA: Insufficient documentation

## 2014-08-03 DIAGNOSIS — F17210 Nicotine dependence, cigarettes, uncomplicated: Secondary | ICD-10-CM | POA: Diagnosis not present

## 2014-08-03 LAB — URINALYSIS, ROUTINE W REFLEX MICROSCOPIC
Bilirubin Urine: NEGATIVE
GLUCOSE, UA: NEGATIVE mg/dL
HGB URINE DIPSTICK: NEGATIVE
KETONES UR: NEGATIVE mg/dL
LEUKOCYTES UA: NEGATIVE
Nitrite: NEGATIVE
PROTEIN: NEGATIVE mg/dL
Specific Gravity, Urine: >1.03 — ABNORMAL HIGH (ref 1.005–1.030)
UROBILINOGEN UA: 0.2 mg/dL (ref 0.0–1.0)
pH: 6 (ref 5.0–8.0)

## 2014-08-03 NOTE — MAU Note (Signed)
Pt states she had intercourse about an hour ago.  Condom slipped off during intercourse.  Pt tried to retrieve condom and wasn't successful.  Partner tried to retrieve it and pt states he pushed it up even further.  Denies bleeding.

## 2014-08-03 NOTE — MAU Note (Signed)
MAU upt: negative

## 2014-08-03 NOTE — MAU Provider Note (Signed)
History     CSN: [id number]  Arrival date and time: 08/03/14 4801   First Provider Initiated Contact with Patient 08/03/14 [phone]      No chief complaint on file.  HPI  Terri [name] is a 29 year old G1P1 presenting with the complaint of a lost condom. She reports vaginal intercourse about 1 hour ago when she felt the condom slip off. She attempted to retrieve it but could not reach it. Her partner then attempted to retrieve it but Terri [name] reports "he just pushed it up higher". No abdominal pain, vaginal bleeding or vaginal discharge. No other complaints.    The HPI and ROS were taken with provider at the bedside.   OB History    Gravida Para Term Preterm AB TAB SAB Ectopic Multiple Living   1 1 1       1       Past Medical History  Diagnosis Date  . Hypertention, malignant, with acute intensive management   . Hypertension     History reviewed. No pertinent past surgical history.  History reviewed. No pertinent family history.  History  Substance Use Topics  . Smoking status: Current Every Day Smoker -- 0.20 packs/day    Types: Cigarettes  . Smokeless tobacco: Never Used  . Alcohol Use: Yes     Comment: occassionally    Allergies: No Known Allergies  Prescriptions prior to admission  Medication Sig Dispense Refill Last Dose  . hydrochlorothiazide (HYDRODIURIL) 25 MG tablet Take 1 tablet (25 mg total) by mouth daily. 30 tablet 11 08/02/2014 at Unknown time  . azithromycin (ZITHROMAX) 250 MG tablet Take 1 tablet (250 mg total) by mouth daily. Take first 2 tablets together, then 1 every day until finished. (Patient not taking: Reported on 06/25/2014) 6 tablet 0 Completed Course at Unknown time  . metoprolol tartrate (LOPRESSOR) 25 MG tablet Take 1 tablet (25 mg total) by mouth 2 (two) times daily. (Patient not taking: Reported on 05/12/2014) 60 tablet 3 Not Taking at Unknown time  . metroNIDAZOLE (FLAGYL) 500 MG tablet Take 1 tablet (500 mg total) by mouth 2 (two) times  daily. (Patient not taking: Reported on 08/03/2014) 14 tablet 0 Completed Course at Unknown time    Review of Systems  Constitutional: Negative for fever.  Gastrointestinal: Negative for nausea, vomiting and abdominal pain.  Genitourinary: Negative for dysuria.  Musculoskeletal: Negative for back pain.  Neurological: Negative for headaches.   Physical Exam   Blood pressure 133/80, pulse 94, temperature 97.9 F (36.6 C), temperature source Oral, resp. rate 18, height 5\' 8"  (1.727 m), weight 88.451 kg (195 lb), last menstrual period 07/22/2014, SpO2 99 %.  Physical Exam  Constitutional: She is oriented to person, place, and time. She appears well-developed and well-nourished. No distress.  Cardiovascular: Normal rate.   Respiratory: Effort normal.  GI: Soft. There is no tenderness.  Neurological: She is alert and oriented to person, place, and time.    MAU Course  Procedures  MDM Urine pregnancy test negative  Speculum exam to retrieve condom. Removed condom from vagina with pederson speculum and ring forceps. Condom easily visualized and removed intact.  Assessment and Plan  1. Foreign body removal - Condom removed from vagina - Advised to return to MAU or ED if abdominal pain or fever occurs in next few days  Barrett,Stevi M 08/03/2014, 9:05 AM     PA student attestation:  I have seen and examined this patient; I agree with above documentation in the PA students  note.   Terri [name] is a 29 y.o. G1P1001 reporting "condom stuck in the vagina".  +Denies abdominal pain, fever or vaginal discharge. She has no other concerns   PE: BP 122/80 mmHg  Pulse 102  Temp(Src) 97.9 F (36.6 C) (Oral)  Resp 18  Ht 5\' 8"  (1.727 m)  Wt 88.451 kg (195 lb)  BMI 29.66 kg/m2  SpO2 99%  LMP 07/22/2014 (Exact Date) Gen: calm comfortable Resp: normal effort, no distress  ROS, labs, PMH reviewed   Plan:  Discharge home in stable condition Return with any abdominal  pain/fever Return to MAU for emergencies Condoms always    Darrelyn Hillock Rasch, NP 08/03/2014 10:39 AM

## 2014-08-17 ENCOUNTER — Inpatient Hospital Stay (HOSPITAL_COMMUNITY)
Admission: AD | Admit: 2014-08-17 | Discharge: 2014-08-17 | Disposition: A | Discharge status: Home / Self Care | Payer: Medicaid Other | Source: Ambulatory Visit | Attending: Obstetrics & Gynecology | Admitting: Obstetrics & Gynecology

## 2014-08-17 ENCOUNTER — Encounter (HOSPITAL_COMMUNITY): Payer: Self-pay

## 2014-08-17 DIAGNOSIS — K611 Rectal abscess: Secondary | ICD-10-CM | POA: Insufficient documentation

## 2014-08-17 DIAGNOSIS — L0291 Cutaneous abscess, unspecified: Secondary | ICD-10-CM

## 2014-08-17 DIAGNOSIS — L739 Follicular disorder, unspecified: Secondary | ICD-10-CM

## 2014-08-17 DIAGNOSIS — I10 Essential (primary) hypertension: Secondary | ICD-10-CM | POA: Insufficient documentation

## 2014-08-17 DIAGNOSIS — K6289 Other specified diseases of anus and rectum: Secondary | ICD-10-CM | POA: Diagnosis present

## 2014-08-17 DIAGNOSIS — L738 Other specified follicular disorders: Secondary | ICD-10-CM | POA: Diagnosis not present

## 2014-08-17 DIAGNOSIS — L0390 Cellulitis, unspecified: Secondary | ICD-10-CM

## 2014-08-17 DIAGNOSIS — F17210 Nicotine dependence, cigarettes, uncomplicated: Secondary | ICD-10-CM | POA: Insufficient documentation

## 2014-08-17 HISTORY — DX: Unspecified asthma, uncomplicated: J45.909

## 2014-08-17 LAB — POCT PREGNANCY, URINE: PREG TEST UR: NEGATIVE

## 2014-08-17 MED ORDER — HYDROCODONE-ACETAMINOPHEN 5-325 MG PO TABS: 1.0000 | ORAL_TABLET | ORAL | Status: DC | PRN

## 2014-08-17 MED ORDER — SULFAMETHOXAZOLE-TRIMETHOPRIM 800-160 MG PO TABS: 1.0000 | ORAL_TABLET | Freq: Two times a day (BID) | ORAL | Status: AC

## 2014-08-17 MED ORDER — HYDROCODONE-ACETAMINOPHEN 5-325 MG PO TABS
1.0000 | ORAL_TABLET | Freq: Once | ORAL | Status: AC
  Administered 2014-08-17: 1 via ORAL
  Filled 2014-08-17: qty 1

## 2014-08-17 NOTE — MAU Provider Note (Signed)
History     CSN: [id number]  Arrival date and time: 08/17/14 0915   None     No chief complaint on file.  HPI    [name] is a 29 y.o. female G1P1001 who presents with rectal pain; she has a bump near her "crack." She states she is having a lot of pain from it. She first noticed it yesterday when she first felt pressure. She shaves in her vaginal and rectal area religiously.   She is a patient at [facility] and has not contacted them regarding this problem.   OB History    Gravida Para Term Preterm AB TAB SAB Ectopic Multiple Living   1 1 1       1       Past Medical History  Diagnosis Date  . Hypertention, malignant, with acute intensive management   . Hypertension   . Asthma     History reviewed. No pertinent past surgical history.  History reviewed. No pertinent family history.  History  Substance Use Topics  . Smoking status: Current Every Day Smoker -- 0.20 packs/day    Types: Cigarettes  . Smokeless tobacco: Never Used  . Alcohol Use: Yes     Comment: occassionally    Allergies: No Known Allergies  Prescriptions prior to admission  Medication Sig Dispense Refill Last Dose  . azithromycin (ZITHROMAX) 250 MG tablet Take 1 tablet (250 mg total) by mouth daily. Take first 2 tablets together, then 1 every day until finished. (Patient not taking: Reported on 06/25/2014) 6 tablet 0 Completed Course at Unknown time  . hydrochlorothiazide (HYDRODIURIL) 25 MG tablet Take 1 tablet (25 mg total) by mouth daily. 30 tablet 11 08/02/2014 at Unknown time  . metoprolol tartrate (LOPRESSOR) 25 MG tablet Take 1 tablet (25 mg total) by mouth 2 (two) times daily. (Patient not taking: Reported on 05/12/2014) 60 tablet 3 Not Taking at Unknown time  . metroNIDAZOLE (FLAGYL) 500 MG tablet Take 1 tablet (500 mg total) by mouth 2 (two) times daily. (Patient not taking: Reported on 08/03/2014) 14 tablet 0 Completed Course at Unknown time   No results found for this  or any previous visit (from the past 48 hour(s)).   Review of Systems  Constitutional: Negative for fever and chills.  Gastrointestinal: Positive for nausea.  Genitourinary:       Bump near vagina and rectum.    Physical Exam   Blood pressure 147/98, pulse 90, temperature 98.9 F (37.2 C), temperature source Oral, resp. rate 18, height 5\' 8"  (1.727 m), weight 114.873 kg (253 lb 4 oz), last menstrual period 08/16/2014.  Physical Exam  Constitutional: She is oriented to person, place, and time. She appears well-developed and well-nourished. No distress.  HENT:  Head: Normocephalic.  Eyes: Pupils are equal, round, and reactive to light.  Neck: Neck supple.  Respiratory: Effort normal.  Genitourinary:    Pelvic exam was performed with patient supine.  Musculoskeletal: Normal range of motion.  Neurological: She is alert and oriented to person, place, and time.  Skin: Skin is warm. She is not diaphoretic.  Psychiatric: Her behavior is normal.    MAU Course  Procedures  None  MDM Discussed that I did not feel the abscess was ready to be drained at this time. While in MAU the patient called [facility] to schedule an appointment this week for follow up.   Assessment and Plan   A:  1. Folliculitis   2. Cellulitis  and abscess     P:  Discharge home in stable condition  RX: Bactrim        Vicodin #6 no refill  If symptoms worsen go to [facility] or [facility]  Warm compresses  Follow up with PCP

## 2014-08-17 NOTE — Discharge Instructions (Signed)

## 2014-08-17 NOTE — MAU Note (Signed)
Pt states here for cyst on lower labia. Began hurting yesterday however took hot bath and swelling went down.

## 2014-08-19 ENCOUNTER — Ambulatory Visit: Payer: Self-pay | Admitting: Family Medicine

## 2014-10-06 ENCOUNTER — Telehealth: Payer: Self-pay

## 2014-10-06 NOTE — Telephone Encounter (Signed)
medical records pulled from [facility] were faxed today to Roque Cash and Tamala Julian at [phone] [place], [place]  dates were in 2005-2006

## 2014-10-20 ENCOUNTER — Emergency Department (HOSPITAL_COMMUNITY)
Admission: EM | Admit: 2014-10-20 | Discharge: 2014-10-20 | Disposition: A | Discharge status: Home / Self Care | Payer: Medicaid Other | Attending: Emergency Medicine | Admitting: Emergency Medicine

## 2014-10-20 ENCOUNTER — Encounter (HOSPITAL_COMMUNITY): Payer: Self-pay | Admitting: *Deleted

## 2014-10-20 DIAGNOSIS — Z720 Tobacco use: Secondary | ICD-10-CM | POA: Insufficient documentation

## 2014-10-20 DIAGNOSIS — Y9389 Activity, other specified: Secondary | ICD-10-CM | POA: Insufficient documentation

## 2014-10-20 DIAGNOSIS — J45909 Unspecified asthma, uncomplicated: Secondary | ICD-10-CM | POA: Insufficient documentation

## 2014-10-20 DIAGNOSIS — Z79899 Other long term (current) drug therapy: Secondary | ICD-10-CM | POA: Insufficient documentation

## 2014-10-20 DIAGNOSIS — X58XXXA Exposure to other specified factors, initial encounter: Secondary | ICD-10-CM | POA: Diagnosis not present

## 2014-10-20 DIAGNOSIS — Y998 Other external cause status: Secondary | ICD-10-CM | POA: Diagnosis not present

## 2014-10-20 DIAGNOSIS — I10 Essential (primary) hypertension: Secondary | ICD-10-CM | POA: Diagnosis not present

## 2014-10-20 DIAGNOSIS — T192XXA Foreign body in vulva and vagina, initial encounter: Secondary | ICD-10-CM | POA: Insufficient documentation

## 2014-10-20 DIAGNOSIS — Y9289 Other specified places as the place of occurrence of the external cause: Secondary | ICD-10-CM | POA: Insufficient documentation

## 2014-10-20 NOTE — Discharge Instructions (Signed)
Vaginal Foreign Body A vaginal foreign body is any object that gets stuck or left inside the vagina. This can cause:  Bleeding.  Itching.  Pain.  Swelling.  Rash. In most cases, symptoms go away once the object is found and taken out. Rarely, an object can break through the walls of the vagina and cause a serious infection. HOME CARE  Take all medicines as told by your doctor.  If you were given an antibiotic medicine, finish it all even if you start to feel better.  Keep all follow-up visits as told by your doctor. This is important. GET HELP IF:  You have a fever.  You have belly (abdominal) pain.  You have pain when you pee (urinate). GET HELP RIGHT AWAY IF:   You have very bad belly pain.  You have heavy bleeding or fluid coming from the vagina. MAKE SURE YOU:  Understand these instructions.  Will watch your condition.  Will get help right away if you are not doing well or get worse. Document Released: 05/17/2009 Document Revised: 10/13/2013 Document Reviewed: 03/28/2013 [facility] Patient Information 2015 [place], [place]. This information is not intended to replace advice given to you by your health care provider. Make sure you discuss any questions you have with your health care provider.

## 2014-10-20 NOTE — ED Provider Notes (Signed)
CSN: [id number]     Arrival date & time 10/20/14  3300 History   First MD Initiated Contact with Patient 10/20/14 0430     Chief Complaint  Patient presents with  . Foreign Body in Vagina     (Consider location/radiation/quality/duration/timing/severity/associated sxs/prior Treatment) HPI 29 year old female presents to the emergency department with complaint of a condom in her vagina.  Patient reports she used a different kind of condom with her partner tonight, and it came off during intercourse.  She and her partner were unable to locate it, and so they pushed it further in.  Patient denies any other complaints at this time Past Medical History  Diagnosis Date  . Hypertention, malignant, with acute intensive management   . Hypertension   . Asthma    History reviewed. No pertinent past surgical history. No family history on file. History  Substance Use Topics  . Smoking status: Current Every Day Smoker -- 0.20 packs/day    Types: Cigarettes  . Smokeless tobacco: Never Used  . Alcohol Use: Yes     Comment: occassionally   OB History    Gravida Para Term Preterm AB TAB SAB Ectopic Multiple Living   1 1 1       1      Review of Systems   See History of Present Illness; otherwise all other systems are reviewed and negative  Allergies  Review of patient's allergies indicates no known allergies.  Home Medications   Prior to Admission medications   Medication Sig Start Date End Date Taking? Authorizing Provider  hydrochlorothiazide (HYDRODIURIL) 25 MG tablet Take 25 mg by mouth daily.   Yes Historical Provider, MD  HYDROcodone-acetaminophen (NORCO/VICODIN) 5-325 MG per tablet Take 1 tablet by mouth every 4 (four) hours as needed. Patient not taking: Reported on 10/20/2014 08/17/14   Lezlie Lye, NP  metoprolol tartrate (LOPRESSOR) 25 MG tablet Take 1 tablet (25 mg total) by mouth 2 (two) times daily. Patient not taking: Reported on 05/12/2014 12/04/13   Kandis Nab, MD    BP 137/88 mmHg  Pulse 78  Temp(Src) 97.6 F (36.4 C) (Oral)  Resp 18  SpO2 100%  LMP 10/14/2014 Physical Exam  Constitutional: She is oriented to person, place, and time. She appears well-developed and well-nourished.  HENT:  Head: Normocephalic and atraumatic.  Nose: Nose normal.  Mouth/Throat: Oropharynx is clear and moist.  Eyes: Conjunctivae and EOM are normal. Pupils are equal, round, and reactive to light.  Neck: Normal range of motion. Neck supple. No JVD present. No tracheal deviation present. No thyromegaly present.  Pulmonary/Chest: Effort normal. No stridor. No respiratory distress.  Abdominal: Soft. Bowel sounds are normal. She exhibits no distension and no mass. There is no tenderness. There is no rebound and no guarding.  Genitourinary:  External genitalia normal Vagina without discharge.  Condom noted at right posterior fornix under the cervix.  Removed with ring forceps without difficulty Cervix  normal negative for cervical motion tenderness Adnexa palpated, no masses or negative for tenderness noted Bladder palpated negative for tenderness Uterus palpated no masses or negative for tenderness    Musculoskeletal: Normal range of motion. She exhibits no edema or tenderness.  Lymphadenopathy:    She has no cervical adenopathy.  Neurological: She is alert and oriented to person, place, and time. She displays normal reflexes. She exhibits normal muscle tone. Coordination normal.  Skin: Skin is warm and dry. No rash noted. No erythema. No pallor.  Psychiatric: She has a normal mood and  affect. Her behavior is normal. Judgment and thought content normal.  Nursing note and vitals reviewed.   ED Course  Procedures (including critical care time) Labs Review Labs Reviewed - No data to display  Imaging Review No results found.   EKG Interpretation None      MDM   Final diagnoses:  Vaginal foreign object, initial encounter   29 year old female with condom  lodged in the vagina removed without difficulty.  Patient stable for discharge    Linton Flemings, MD 10/20/14 [phone]

## 2014-10-20 NOTE — ED Notes (Signed)
Pt upset about wait time, pt states "I just have a condom stuck up in me that I need pulled out really quick", explained to pt about delay.

## 2014-10-20 NOTE — ED Notes (Signed)
Pt states that she and her partner were using a new kind of condom and it came off during intercourse; pt states that she attempted to retrieve the condom but was unable to locate it in her vagina; pt states "I think it's too far in there"

## 2014-11-12 ENCOUNTER — Ambulatory Visit (INDEPENDENT_AMBULATORY_CARE_PROVIDER_SITE_OTHER): Payer: Medicaid Other | Admitting: Family Medicine

## 2014-11-12 ENCOUNTER — Encounter: Payer: Self-pay | Admitting: Family Medicine

## 2014-11-12 VITALS — BP 148/108 | HR 87 | Temp 98.2°F | Ht 68.0 in | Wt 242.5 lb

## 2014-11-12 DIAGNOSIS — G43909 Migraine, unspecified, not intractable, without status migrainosus: Secondary | ICD-10-CM | POA: Insufficient documentation

## 2014-11-12 DIAGNOSIS — G43709 Chronic migraine without aura, not intractable, without status migrainosus: Secondary | ICD-10-CM | POA: Diagnosis not present

## 2014-11-12 DIAGNOSIS — L299 Pruritus, unspecified: Secondary | ICD-10-CM

## 2014-11-12 DIAGNOSIS — R05 Cough: Secondary | ICD-10-CM | POA: Insufficient documentation

## 2014-11-12 DIAGNOSIS — R059 Cough, unspecified: Secondary | ICD-10-CM

## 2014-11-12 MED ORDER — HYDROCHLOROTHIAZIDE 25 MG PO TABS: 25.0000 mg | ORAL_TABLET | Freq: Every day | ORAL | Status: DC

## 2014-11-12 MED ORDER — BENZONATATE 100 MG PO CAPS: 100.0000 mg | ORAL_CAPSULE | Freq: Three times a day (TID) | ORAL | Status: DC | PRN

## 2014-11-12 MED ORDER — SUMATRIPTAN SUCCINATE 50 MG PO TABS: 50.0000 mg | ORAL_TABLET | ORAL | Status: DC | PRN

## 2014-11-12 NOTE — Progress Notes (Signed)
   Subjective:    Patient ID: Terri [name], female    DOB: [birth year], 29 y.o.   MRN: [id number]  HPI 29 year old female presents for same day appointment with complaints of "not feeling well". See issues/complaints below.  1) Migraine  Patient reports that she recently got back from [place].  For the past 2 days she's been experiencing migraine headache. She has a known history of migraine headache.  Headache is located bitemporally and described as pulsating/sharp.  She notes associated light sensitivity, nausea, and vomiting.  She has tried ibuprofen with little relief.  Headache is severe in nature.  2) Cough  Patient reports a 2 day history of dry cough.  No associated fevers, chills, rhinorrhea, ear pain, shortness of breath.  No known sick contacts.  No interventions tried. No exacerbating or relieving factors.  3) Skin itch  Patient reports that her skin has been quite itchy since she returned back from [place].  No known rash.  She states that the areas affected are primarily arms and legs.  She has tried some of her mother's steroid ointment with improvement.  Social history -current everyday smoker.  Review of Systems  Constitutional: Negative for fever and chills.  HENT: Positive for voice change. Negative for rhinorrhea, sinus pressure and sore throat.   Respiratory: Positive for cough. Negative for shortness of breath.   Neurological: Positive for headaches.      Objective:   Physical Exam Filed Vitals:   11/12/14 1154  BP: 148/108  Pulse:   Temp:    Vital signs reviewed.  Exam: General: well dressed, well appearing; no acute distress. HEENT: NCAT. Oropharynx clear. TMs normal bilaterally. Neck: Supple. No adenopathy. Trachea midline Cardiovascular: RRR. No murmurs, rubs, or gallops. Respiratory: CTAB. No rales, rhonchi, or wheeze. Abdomen: soft, nontender, nondistended. Skin: Warm, dry, intact. No appreciable rash noted. Neuro: No focal  deficits.    Assessment & Plan:  See Problem List

## 2014-11-12 NOTE — Assessment & Plan Note (Signed)
Treating with Imitrex. Rx sent today.

## 2014-11-12 NOTE — Patient Instructions (Signed)
It was nice to see you today.  Use the Imitrex for migraine as indicated.  Use the cough medication as needed.  Follow up if you fail to improve or worsen.  Take care  Dr. Lacinda Axon

## 2014-11-12 NOTE — Assessment & Plan Note (Signed)
Exam unremarkable.  No evidence of bacterial infection. Treating with Tessalon. F/U if fails to improve or worsens.

## 2014-11-12 NOTE — Assessment & Plan Note (Addendum)
Normal exam. Reassured. Advised to keep skin well hydrated/moist.

## 2015-03-08 ENCOUNTER — Other Ambulatory Visit: Payer: Self-pay | Admitting: Family Medicine

## 2015-03-08 ENCOUNTER — Ambulatory Visit: Payer: Self-pay | Admitting: Family Medicine

## 2015-03-08 MED ORDER — HYDROCHLOROTHIAZIDE 25 MG PO TABS: 25.0000 mg | ORAL_TABLET | Freq: Every day | ORAL | Status: DC

## 2015-03-08 NOTE — Telephone Encounter (Signed)
Pt missed appt this morning for High BP. States that she needs her hydrochlorothiazide refilled asap, states that she is out now and needs it today. Please contact pt when ready, thank you, Fonda Kinder, ASA

## 2015-03-08 NOTE — Telephone Encounter (Signed)
Patient in nurse clinic to request refill on BP medication and headache medication.  Patient has been out of BP medication for about 2 weeks.  Patient has requested also a different headache medication because the Imtrex makes her feel bad.  She is having a headache due to increased blood pressure and out of medication.  Patient went to [facility] but they did not refill her medication.  Per Tomasa Hosteller, RN triage nurse can refill BP medication X 1, no refills until patient has been seen by a provider.  Derl Barrow, RN

## 2015-08-10 ENCOUNTER — Encounter (HOSPITAL_COMMUNITY): Payer: Self-pay | Admitting: Emergency Medicine

## 2015-08-10 ENCOUNTER — Emergency Department (HOSPITAL_COMMUNITY)
Admission: EM | Admit: 2015-08-10 | Discharge: 2015-08-10 | Disposition: A | Discharge status: Home / Self Care | Payer: Medicaid Other | Attending: Emergency Medicine | Admitting: Emergency Medicine

## 2015-08-10 DIAGNOSIS — F17210 Nicotine dependence, cigarettes, uncomplicated: Secondary | ICD-10-CM | POA: Insufficient documentation

## 2015-08-10 DIAGNOSIS — G43709 Chronic migraine without aura, not intractable, without status migrainosus: Secondary | ICD-10-CM

## 2015-08-10 DIAGNOSIS — R51 Headache: Secondary | ICD-10-CM | POA: Diagnosis present

## 2015-08-10 DIAGNOSIS — J45909 Unspecified asthma, uncomplicated: Secondary | ICD-10-CM | POA: Insufficient documentation

## 2015-08-10 DIAGNOSIS — R040 Epistaxis: Secondary | ICD-10-CM | POA: Diagnosis not present

## 2015-08-10 DIAGNOSIS — Z3202 Encounter for pregnancy test, result negative: Secondary | ICD-10-CM | POA: Diagnosis not present

## 2015-08-10 DIAGNOSIS — I10 Essential (primary) hypertension: Secondary | ICD-10-CM | POA: Diagnosis not present

## 2015-08-10 DIAGNOSIS — Z79899 Other long term (current) drug therapy: Secondary | ICD-10-CM | POA: Diagnosis not present

## 2015-08-10 LAB — URINALYSIS, ROUTINE W REFLEX MICROSCOPIC
BILIRUBIN URINE: NEGATIVE
GLUCOSE, UA: NEGATIVE mg/dL
Ketones, ur: NEGATIVE mg/dL
Nitrite: POSITIVE — AB
PH: 6 (ref 5.0–8.0)
Protein, ur: NEGATIVE mg/dL
Specific Gravity, Urine: 1.023 (ref 1.005–1.030)

## 2015-08-10 LAB — URINE MICROSCOPIC-ADD ON

## 2015-08-10 LAB — CBC
HEMATOCRIT: 39.4 % (ref 36.0–46.0)
Hemoglobin: 12.9 g/dL (ref 12.0–15.0)
MCH: 31 pg (ref 26.0–34.0)
MCHC: 32.7 g/dL (ref 30.0–36.0)
MCV: 94.7 fL (ref 78.0–100.0)
Platelets: 278 10*3/uL (ref 150–400)
RBC: 4.16 MIL/uL (ref 3.87–5.11)
RDW: 12.7 % (ref 11.5–15.5)
WBC: 3.6 10*3/uL — ABNORMAL LOW (ref 4.0–10.5)

## 2015-08-10 LAB — BASIC METABOLIC PANEL
ANION GAP: 7 (ref 5–15)
BUN: 10 mg/dL (ref 6–20)
CALCIUM: 8.7 mg/dL — AB (ref 8.9–10.3)
CO2: 26 mmol/L (ref 22–32)
Chloride: 106 mmol/L (ref 101–111)
Creatinine, Ser: 0.94 mg/dL (ref 0.44–1.00)
GFR calc Af Amer: >60 mL/min (ref >60)
GFR calc non Af Amer: >60 mL/min (ref >60)
GLUCOSE: 100 mg/dL — AB (ref 65–99)
POTASSIUM: 3.9 mmol/L (ref 3.5–5.1)
Sodium: 139 mmol/L (ref 135–145)

## 2015-08-10 LAB — POC URINE PREG, ED: Preg Test, Ur: NEGATIVE

## 2015-08-10 LAB — CBG MONITORING, ED: Glucose-Capillary: 84 mg/dL (ref 65–99)

## 2015-08-10 MED ORDER — METOCLOPRAMIDE HCL 5 MG/ML IJ SOLN
10.0000 mg | Freq: Once | INTRAMUSCULAR | Status: AC
  Administered 2015-08-10: 10 mg via INTRAVENOUS
  Filled 2015-08-10: qty 2

## 2015-08-10 MED ORDER — KETOROLAC TROMETHAMINE 30 MG/ML IJ SOLN
30.0000 mg | Freq: Once | INTRAMUSCULAR | Status: AC
  Administered 2015-08-10: 30 mg via INTRAVENOUS
  Filled 2015-08-10: qty 1

## 2015-08-10 MED ORDER — SUMATRIPTAN SUCCINATE 50 MG PO TABS: 50.0000 mg | ORAL_TABLET | ORAL | Status: DC | PRN

## 2015-08-10 MED ORDER — DIPHENHYDRAMINE HCL 50 MG/ML IJ SOLN
25.0000 mg | Freq: Once | INTRAMUSCULAR | Status: AC
  Administered 2015-08-10: 25 mg via INTRAVENOUS
  Filled 2015-08-10: qty 1

## 2015-08-10 MED ORDER — SODIUM CHLORIDE 0.9 % IV BOLUS (SEPSIS)
1000.0000 mL | Freq: Once | INTRAVENOUS | Status: AC
  Administered 2015-08-10: 1000 mL via INTRAVENOUS

## 2015-08-10 NOTE — Discharge Instructions (Signed)

## 2015-08-10 NOTE — ED Provider Notes (Signed)
CSN: [id number]     Arrival date & time 08/10/15  1245 History   First MD Initiated Contact with Patient 08/10/15 1800     Chief Complaint  Patient presents with  . Headache  . Hypertension  . Epistaxis     (Consider location/radiation/quality/duration/timing/severity/associated sxs/prior Treatment) HPI   30 year old female with history of hypertension and asthma presents with complaint of headache. Patient reports gradual onset of sharp throbbing frontal headache ongoing for the past 3 days. Headache is associated with lightheadedness, generalized fatigue, and today she had a nosebleed that lasted for less than 5 minutes. She has been taking ibuprofen for pain without adequate relief. She attributed her headache due to her high blood pressure which she takes hydrochlorothiazide on occasion. She does not check her blood pressure at home. She denies having any fever, double vision, neck stiffness, focal numbness or weakness, or rash. She denies having chest pain or shortness of breath, no nausea vomiting diarrhea. She does endorse light and sound sensitivity. She has had headache like this in the past and attributed to hypertension. She has had CT scan of her head in the past that was unremarkable.  Past Medical History  Diagnosis Date  . Hypertention, malignant, with acute intensive management   . Hypertension   . Asthma    History reviewed. No pertinent past surgical history. History reviewed. No pertinent family history. Social History  Substance Use Topics  . Smoking status: Current Every Day Smoker -- 0.20 packs/day    Types: Cigarettes  . Smokeless tobacco: Never Used  . Alcohol Use: Yes     Comment: occassionally   OB History    Gravida Para Term Preterm AB TAB SAB Ectopic Multiple Living   1 1 1       1      Review of Systems  All other systems reviewed and are negative.     Allergies  Review of patient's allergies indicates no known allergies.  Home Medications    Prior to Admission medications   Medication Sig Start Date End Date Taking? Authorizing Provider  benzonatate (TESSALON) 100 MG capsule Take 1 capsule (100 mg total) by mouth 3 (three) times daily as needed for cough. 11/12/14   Coral Spikes, DO  hydrochlorothiazide (HYDRODIURIL) 25 MG tablet Take 1 tablet (25 mg total) by mouth daily. 03/08/15   Frazier Richards, MD  SUMAtriptan (IMITREX) 50 MG tablet Take 1 tablet (50 mg total) by mouth every 2 (two) hours as needed for migraine. May repeat x 1 in 2 hours if headache persists or recurs. 11/12/14   Jayce G Cook, DO   BP 157/106 mmHg  Pulse 72  Temp(Src) 98.5 F (36.9 C) (Oral)  Resp 18  SpO2 99% Physical Exam  Constitutional: She is oriented to person, place, and time. She appears well-developed and well-nourished. No distress.  African-American female laying in bed in no acute discomfort, nontoxic in appearance  HENT:  Head: Atraumatic.  Right Ear: External ear normal.  Left Ear: External ear normal.  Nose: Nose normal.  Mouth/Throat: Oropharynx is clear and moist. No oropharyngeal exudate.  Eyes: Conjunctivae and EOM are normal. Pupils are equal, round, and reactive to light.  Neck: Normal range of motion. Neck supple.  No nuchal rigidity  Cardiovascular: Normal rate, regular rhythm and intact distal pulses.   Pulmonary/Chest: Effort normal and breath sounds normal.  Abdominal: Soft. There is no tenderness.  Musculoskeletal:  5/5 strength to all 4 extremities without focal weakness.  Neurological: She  is alert and oriented to person, place, and time. She has normal strength. No cranial nerve deficit or sensory deficit. She displays a negative Romberg sign. Coordination and gait normal. GCS eye subscore is 4. GCS verbal subscore is 5. GCS motor subscore is 6.  Skin: No rash noted.  Psychiatric: She has a normal mood and affect.  Nursing note and vitals reviewed.   ED Course  Procedures (including critical care time) Labs  Review Labs Reviewed  BASIC METABOLIC PANEL - Abnormal; Notable for the following:    Glucose, Bld 100 (*)    Calcium 8.7 (*)    All other components within normal limits  CBC - Abnormal; Notable for the following:    WBC 3.6 (*)    All other components within normal limits  URINALYSIS, ROUTINE W REFLEX MICROSCOPIC (NOT AT [facility]) - Abnormal; Notable for the following:    APPearance CLOUDY (*)    Hgb urine dipstick SMALL (*)    Nitrite POSITIVE (*)    Leukocytes, UA LARGE (*)    All other components within normal limits  URINE MICROSCOPIC-ADD ON - Abnormal; Notable for the following:    Squamous Epithelial / LPF 6-30 (*)    Bacteria, UA MANY (*)    All other components within normal limits  URINE CULTURE  CBG MONITORING, ED  CBG MONITORING, ED  POC URINE PREG, ED    Imaging Review No results found. I have personally reviewed and evaluated these images and lab results as part of my medical decision-making.   EKG Interpretation None     ED ECG REPORT   Date: 08/10/2015  Rate: 80  Rhythm: normal sinus rhythm  QRS Axis: normal  Intervals: normal  ST/T Wave abnormalities: ST elevations diffusely  Conduction Disutrbances:none  Narrative Interpretation:  ST elevation suggest acute pericarditis  Old EKG Reviewed: none available  I have personally reviewed the EKG tracing and agree with the computerized printout as noted.   MDM   Final diagnoses:  Chronic migraine without aura without status migrainosus, not intractable    BP 154/100 mmHg  Pulse 80  Temp(Src) 98.5 F (36.9 C) (Oral)  Resp 16  SpO2 100%   Headache similar to previous, no fever, neck stiffness, neuro findings or new symptoms to suggest more serious etiology.  I don't think SAH, ICH, meningitis, encephalitis, mass at this time.  No recent trauma.  I don't feel imaging necessary at this time.  Plan to control symptoms.  Her ECG obtained showing diffused ST elevation suggests acute pericarditis however  pt denies having any active chest pain.  Her BP is [id number] systolic.  Low suspicion for hypertensive emergency.    7:49 PM Urine was obtained showing signs of urinary tract which infection with a positive nitrite and WBC too numerous to count.  Patient however denies having any dysuria or any urinary symptoms such as urinary tract infection. She has normal WBC. Her pregnancy test negative. A urine cultures was sent.    8:46 PM Headache improves with migraine cocktail. Patient is stable for discharge. I will provide a short course of Imitrex to use for headache as needed. She will need to follow-up with her primary care provider for a recheck of her blood pressure. Return precaution discussed.  Domenic Moras, PA-C 08/10/15 2046  Dorie Rank, MD 08/10/15 [phone]

## 2015-08-10 NOTE — ED Notes (Signed)
Patient was alert, oriented and stable upon discharge. RN went over AVS and patient had no further questions.  

## 2015-08-10 NOTE — ED Notes (Signed)
Migraine x 3 days. Woke up this morning with a headache and a nose bleed. States she took an ibuprofen for pain, and took her HTN medication without relief. Pt states she feels fatigued. Focal neuro exam WNL in triage. Unable to illicit any neuro deficits.

## 2015-08-11 ENCOUNTER — Encounter: Payer: Self-pay | Admitting: *Deleted

## 2015-08-11 ENCOUNTER — Telehealth: Payer: Self-pay | Admitting: Family Medicine

## 2015-08-11 ENCOUNTER — Ambulatory Visit (INDEPENDENT_AMBULATORY_CARE_PROVIDER_SITE_OTHER): Payer: Medicaid Other | Admitting: *Deleted

## 2015-08-11 VITALS — BP 142/94 | HR 91

## 2015-08-11 DIAGNOSIS — Z136 Encounter for screening for cardiovascular disorders: Secondary | ICD-10-CM

## 2015-08-11 DIAGNOSIS — Z0130 Encounter for examination of blood pressure without abnormal findings: Secondary | ICD-10-CM

## 2015-08-11 DIAGNOSIS — I10 Essential (primary) hypertension: Secondary | ICD-10-CM

## 2015-08-11 NOTE — Progress Notes (Signed)
   Patient in nurse clinic for blood pressure check.  Patient stated she was seen in ED 08/10/15 for increase blood pressure.  Patient complaining of weakness and fatigue.  Advised patient that she should be seen by PCP.  Appointment for tomorrow at 4:15 with same day provider.  Derl Barrow, RN

## 2015-08-11 NOTE — Telephone Encounter (Signed)
Was released from hospital last night and was told she must see a dr today. No appts available.  She wants to talk to a nurse about her hypertension

## 2015-08-11 NOTE — Telephone Encounter (Signed)
Return call to patient regarding blood pressure. Patient was seen in the ED last night for high blood pressure.  She is still not feeling well today.  She is complaining of weakness and fatigue.  Advised patient to walk in today to be triaged and blood pressure check.  Patient stated understanding.  Derl Barrow, RN

## 2015-08-12 ENCOUNTER — Ambulatory Visit: Payer: Self-pay | Admitting: Family Medicine

## 2015-08-13 LAB — URINE CULTURE

## 2016-02-10 ENCOUNTER — Encounter: Payer: Self-pay | Admitting: Family Medicine

## 2016-02-10 ENCOUNTER — Other Ambulatory Visit (HOSPITAL_COMMUNITY)
Admission: RE | Admit: 2016-02-10 | Discharge: 2016-02-10 | Disposition: A | Discharge status: Home / Self Care | Payer: Medicaid Other | Source: Ambulatory Visit | Attending: Family Medicine | Admitting: Family Medicine

## 2016-02-10 ENCOUNTER — Ambulatory Visit (INDEPENDENT_AMBULATORY_CARE_PROVIDER_SITE_OTHER): Payer: Medicaid Other | Admitting: Family Medicine

## 2016-02-10 VITALS — BP 136/87 | HR 80 | Temp 98.5°F | Wt 245.0 lb

## 2016-02-10 DIAGNOSIS — G43709 Chronic migraine without aura, not intractable, without status migrainosus: Secondary | ICD-10-CM | POA: Diagnosis present

## 2016-02-10 DIAGNOSIS — N6452 Nipple discharge: Secondary | ICD-10-CM | POA: Insufficient documentation

## 2016-02-10 DIAGNOSIS — L299 Pruritus, unspecified: Secondary | ICD-10-CM | POA: Diagnosis not present

## 2016-02-10 DIAGNOSIS — R05 Cough: Secondary | ICD-10-CM | POA: Diagnosis not present

## 2016-02-10 DIAGNOSIS — Z01419 Encounter for gynecological examination (general) (routine) without abnormal findings: Secondary | ICD-10-CM | POA: Insufficient documentation

## 2016-02-10 DIAGNOSIS — L0293 Carbuncle, unspecified: Secondary | ICD-10-CM

## 2016-02-10 DIAGNOSIS — Z124 Encounter for screening for malignant neoplasm of cervix: Secondary | ICD-10-CM

## 2016-02-10 DIAGNOSIS — Z1151 Encounter for screening for human papillomavirus (HPV): Secondary | ICD-10-CM | POA: Insufficient documentation

## 2016-02-10 DIAGNOSIS — R5383 Other fatigue: Secondary | ICD-10-CM

## 2016-02-10 DIAGNOSIS — E669 Obesity, unspecified: Secondary | ICD-10-CM

## 2016-02-10 LAB — CBC
HEMATOCRIT: 37.6 % (ref 35.0–45.0)
Hemoglobin: 12.5 g/dL (ref 11.7–15.5)
MCH: 30.3 pg (ref 27.0–33.0)
MCHC: 33.2 g/dL (ref 32.0–36.0)
MCV: 91.3 fL (ref 80.0–100.0)
MPV: 10.2 fL (ref 7.5–12.5)
Platelets: 342 10*3/uL (ref 140–400)
RBC: 4.12 MIL/uL (ref 3.80–5.10)
RDW: 13.6 % (ref 11.0–15.0)
WBC: 5.6 10*3/uL (ref 3.8–10.8)

## 2016-02-10 LAB — POCT URINE PREGNANCY: PREG TEST UR: NEGATIVE

## 2016-02-10 LAB — BASIC METABOLIC PANEL WITH GFR
BUN: 12 mg/dL (ref 7–25)
CHLORIDE: 105 mmol/L (ref 98–110)
CO2: 25 mmol/L (ref 20–31)
CREATININE: 1.05 mg/dL (ref 0.50–1.10)
Calcium: 9.2 mg/dL (ref 8.6–10.2)
GFR, Est African American: 83 mL/min (ref >=60)
GFR, Est Non African American: 72 mL/min (ref >=60)
GLUCOSE: 78 mg/dL (ref 65–99)
Potassium: 4.1 mmol/L (ref 3.5–5.3)
Sodium: 142 mmol/L (ref 135–146)

## 2016-02-10 LAB — PROLACTIN: Prolactin: 5.3 ng/mL

## 2016-02-10 LAB — TSH: TSH: 1.25 mIU/L

## 2016-02-10 MED ORDER — HYDROCHLOROTHIAZIDE 25 MG PO TABS: 25.0000 mg | ORAL_TABLET | Freq: Every day | ORAL | 3 refills | Status: DC

## 2016-02-10 NOTE — Assessment & Plan Note (Signed)
Patient is complaining of recurrent boils that seemed to be developing along her chest, axilla, and groin. She has noticed a association with her menses as they seem to get worse around the time of her period. Etiology currently unknown at this time. Differential certainly includes hidradenitis suppurativa. Also possible contact dermatitis or acne vulgaris (due to the correlation with her menses).  - Currently this issue appears to be in remission. No treatment warranted at this time. - We'll continue to watch. - Due to obesity one may consider obtaining a testosterone level or looking into PCOS. - If hidradenitis suppurativa appears to be the most likely cause then would strongly consider providing patient with topical antibiotic. - If acne vulgaris appears to be the most likely culprit and would strongly consider pushing patient towards topical management and/or hormonal contraceptives.

## 2016-02-10 NOTE — Patient Instructions (Signed)
It was a pleasure seeing you today in our clinic. Today we discussed your skin lesions and nipple discharge. Here is the treatment plan we have discussed and agreed upon together:   - Today we obtained some labs. I like to have you follow-up with [place] in 1-2 weeks to go over these labs. - Regarding the boils that you are developing under skin. There is no clear answer for these at this time. I will like to get the results of the labs before we begin any treatment. - Do your best to stay active and exercises daily. - Listed below our recommendations for the Mediterranean diet.     Why follow it? Research shows. . Those who follow the Mediterranean diet have a reduced risk of heart disease  . The diet is associated with a reduced incidence of Parkinson's and Alzheimer's diseases . People following the diet may have longer life expectancies and lower rates of chronic diseases  . The Dietary Guidelines for Americans recommends the Mediterranean diet as an eating plan to promote health and prevent disease  What Is the Mediterranean Diet?  . Healthy eating plan based on typical foods and recipes of Mediterranean-style cooking . The diet is primarily a plant based diet; these foods should make up a majority of meals   Starches - Plant based foods should make up a majority of meals - They are an important sources of vitamins, minerals, energy, antioxidants, and fiber - Choose whole grains, foods high in fiber and minimally processed items  - Typical grain sources include wheat, oats, barley, corn, brown rice, bulgar, farro, millet, polenta, couscous  - Various types of beans include chickpeas, lentils, fava beans, black beans, white beans   Fruits  Veggies - Large quantities of antioxidant rich fruits & veggies; 6 or more servings  - Vegetables can be eaten raw or lightly drizzled with oil and cooked  - Vegetables common to the traditional Mediterranean Diet include: artichokes, arugula, beets,  broccoli, brussel sprouts, cabbage, carrots, celery, collard greens, cucumbers, eggplant, kale, leeks, lemons, lettuce, mushrooms, okra, onions, peas, peppers, potatoes, pumpkin, radishes, rutabaga, shallots, spinach, sweet potatoes, turnips, zucchini - Fruits common to the Mediterranean Diet include: apples, apricots, avocados, cherries, clementines, dates, figs, grapefruits, grapes, melons, nectarines, oranges, peaches, pears, pomegranates, strawberries, tangerines  Fats - Replace butter and margarine with healthy oils, such as olive oil, canola oil, and tahini  - Limit nuts to no more than a handful a day  - Nuts include walnuts, almonds, pecans, pistachios, pine nuts  - Limit or avoid candied, honey roasted or heavily salted nuts - Olives are central to the [employer] - can be eaten whole or used in a variety of dishes   Meats Protein - Limiting red meat: no more than a few times a month - When eating red meat: choose lean cuts and keep the portion to the size of deck of cards - Eggs: approx. 0 to 4 times a week  - Fish and lean poultry: at least 2 a week  - Healthy protein sources include, chicken, [place], lean beef, lamb - Increase intake of seafood such as tuna, salmon, trout, mackerel, shrimp, scallops - Avoid or limit high fat processed meats such as sausage and bacon  Dairy - Include moderate amounts of low fat dairy products  - Focus on healthy dairy such as fat free yogurt, skim milk, low or reduced fat cheese - Limit dairy products higher in fat such as whole or 2% milk, cheese,  ice cream  Alcohol - Moderate amounts of red wine is ok  - No more than 5 oz daily for women (all ages) and men older than age 7  - No more than 10 oz of wine daily for men younger than 58  Other - Limit sweets and other desserts  - Use herbs and spices instead of salt to flavor foods  - Herbs and spices common to the traditional Mediterranean Diet include: basil, bay leaves, chives, cloves, cumin,  fennel, garlic, lavender, marjoram, mint, oregano, parsley, pepper, rosemary, sage, savory, sumac, tarragon, thyme   It's not just a diet, it's a lifestyle:  . The Mediterranean diet includes lifestyle factors typical of those in the region  . Foods, drinks and meals are best eaten with others and savored . Daily physical activity is important for overall good health . This could be strenuous exercise like running and aerobics . This could also be more leisurely activities such as walking, housework, yard-work, or taking the stairs . Moderation is the key; a balanced and healthy diet accommodates most foods and drinks . Consider portion sizes and frequency of consumption of certain foods   Meal Ideas & Options:  . Breakfast:  o Whole wheat toast or whole wheat English muffins with peanut butter & hard boiled egg o Steel cut oats topped with apples & cinnamon and skim milk  o Fresh fruit: banana, strawberries, melon, berries, peaches  o Smoothies: strawberries, bananas, greek yogurt, peanut butter o Low fat greek yogurt with blueberries and granola  o Egg white omelet with spinach and mushrooms o Breakfast couscous: whole wheat couscous, apricots, skim milk, cranberries  . Sandwiches:  o Hummus and grilled vegetables (peppers, zucchini, squash) on whole wheat bread   o Grilled chicken on whole wheat pita with lettuce, tomatoes, cucumbers or tzatziki  o Tuna salad on whole wheat bread: tuna salad made with greek yogurt, olives, red peppers, capers, green onions o Garlic rosemary lamb pita: lamb sauted with garlic, rosemary, salt & pepper; add lettuce, cucumber, greek yogurt to pita - flavor with lemon juice and black pepper  . Seafood:  o Mediterranean grilled salmon, seasoned with garlic, basil, parsley, lemon juice and black pepper o Shrimp, lemon, and spinach whole-grain pasta salad made with low fat greek yogurt  o Seared scallops with lemon orzo  o Seared tuna steaks seasoned salt,  pepper, coriander topped with tomato mixture of olives, tomatoes, olive oil, minced garlic, parsley, green onions and cappers  . Meats:  o Herbed greek chicken salad with kalamata olives, cucumber, feta  o Red bell peppers stuffed with spinach, bulgur, lean ground beef (or lentils) & topped with feta   o Kebabs: skewers of chicken, tomatoes, onions, zucchini, squash  o [place] burgers: made with red onions, mint, dill, lemon juice, feta cheese topped with roasted red peppers . Vegetarian o Cucumber salad: cucumbers, artichoke hearts, celery, red onion, feta cheese, tossed in olive oil & lemon juice  o Hummus and whole grain pita points with a greek salad (lettuce, tomato, feta, olives, cucumbers, red onion) o Lentil soup with celery, carrots made with vegetable broth, garlic, salt and pepper  o Tabouli salad: parsley, bulgur, mint, scallions, cucumbers, tomato, radishes, lemon juice, olive oil, salt and pepper.

## 2016-02-10 NOTE — Progress Notes (Signed)
Terri Lynch, MD, MS Phone: [phone]  Subjective:  CC -- Annual Physical  Pt reports she had had some difficulty with changing in her menstrual cycles. She states that for most of her life she has not had regular long or heavy periods. Over the past few months this has changed and she now has bleeding that is moderate to severe for 5-7 days. She understands this is a relatively normal duration and flow but it is different than what she is used to.  Patient also endorses "boils" that have been developing on her chest, axilla, and groin. She notes that these tend to get significantly worse around the time of her menstrual cycle. They occasionally will open and drain. They're often fairly tender and inflamed. At this time she does not know what is the cause of this.  Patient has additional complaints of some bilateral nipple discharge. She describes this discharge as occasionally clear or thick/white. She denies any red discharge or blood. Denies any breast tissue masses or change in the contours of her breasts. She states that this also tends to happen around the time of her menses and she often feels as though her breasts are engorged with milk.  Cardiovascular: - Dx Hypertension: yes, treated with HCTZ; needs a refill today  - Dx Hyperlipidemia: Unknown.  - Dx Obesity: yes, class II - Physical Activity: Occasional  - Diabetes: no   Cancer: Colorectal >> Colonoscopy: no Lung >> Tobacco Use: yes, approximately 3 cigarettes per day.   - If so, previous Low-Dose CT screen: no  Breast >> Mammogram: no  - Maternal aunt is the only family member diagnosed with breast cancer that she knows of. Cervical/Endometrial >>  - Postmenopausal: no  - Vaginal Bleeding: yes - Pap Smear: Obtained today   - Previous Abnormal Pap: no Skin >> Suspicious lesions: yes, boils on her chest, axilla, and groin   Social: Alcohol Use: did not ask  Tobacco Use: See above   Other Drugs: no  Risky Sexual  Behavior: no  Depression: no  Support and Life at Home: yes   Other: Osteoporosis: no Zoster Vaccine: no  Flu Vaccine: no  Pneumonia Vaccine: no    ROS- see HPI  Past Medical History Patient Active Problem List   Diagnosis Date Noted  . Nipple discharge 02/10/2016  . Migraine 11/12/2014  . Itchy skin 11/12/2014  . Cough 11/12/2014  . Intermittent palpitations 03/03/2014  . Mixed incontinence 10/03/2013  . Nephrolithiasis 06/18/2013  . Unspecified constipation 02/11/2013  . Screening for STD (sexually transmitted disease) 09/25/2011  . Contraception management 08/31/2011  . Irregular menstrual bleeding 08/29/2011  . Unspecified episodic mood disorder 04/05/2011  . TOBACCO USER 06/14/2009  . Obesity 01/05/2009  . HYPERTENSION, BENIGN ESSENTIAL 05/14/2007  . Recurrent boils 02/18/2007  . ASTHMA, INTERMITTENT 08/09/2006    Medications- reviewed and updated Current Outpatient Prescriptions  Medication Sig Dispense Refill  . benzonatate (TESSALON) 100 MG capsule Take 1 capsule (100 mg total) by mouth 3 (three) times daily as needed for cough. (Patient not taking: Reported on 08/10/2015) 30 capsule 0  . hydrochlorothiazide (HYDRODIURIL) 25 MG tablet Take 1 tablet (25 mg total) by mouth daily. 90 tablet 3  . SUMAtriptan (IMITREX) 50 MG tablet Take 1 tablet (50 mg total) by mouth every 2 (two) hours as needed for migraine. May repeat x 1 in 2 hours if headache persists or recurs. 20 tablet 0   No current facility-administered medications for this visit.     Objective: BP  136/87 (BP Location: Right Arm, Patient Position: Sitting, Cuff Size: Normal)   Pulse 80   Temp 98.5 F (36.9 C) (Oral)   Wt 245 lb (111.1 kg)   LMP 02/03/2016   BMI 37.25 kg/m  Gen: NAD, alert, cooperative with exam HEENT: NCAT, EOMI, PERRL Integument: Numerous fibrous nodules noted within the skin surrounding her groin and medial thighs, axilla, and inframammary folds. No significant induration along  these areas but notable healing tissue from previous inflammatory process present. No evidence of discharge at this time. CV: RRR, good S1/S2, no murmur Resp: CTABL, no wheezes, non-labored Abd: Soft, Non Tender, Non Distended, BS present, no guarding or organomegaly Breasts: breasts appear normal, no suspicious masses, no skin or nipple changes or axillary nodes, symmetric fibrous changes in both upper outer quadrants. Genital Exam: normal external genitalia, vulva, vagina, cervix, uterus and adnexa Ext: No edema, warm Neuro: Alert and oriented, No gross deficits   Assessment/Plan:  Recurrent boils Patient is complaining of recurrent boils that seemed to be developing along her chest, axilla, and groin. She has noticed a association with her menses as they seem to get worse around the time of her period. Etiology currently unknown at this time. Differential certainly includes hidradenitis suppurativa. Also possible contact dermatitis or acne vulgaris (due to the correlation with her menses).  - Currently this issue appears to be in remission. No treatment warranted at this time. - We'll continue to watch. - Due to obesity one may consider obtaining a testosterone level or looking into PCOS. - If hidradenitis suppurativa appears to be the most likely cause then would strongly consider providing patient with topical antibiotic. - If acne vulgaris appears to be the most likely culprit and would strongly consider pushing patient towards topical management and/or hormonal contraceptives.  Nipple discharge Patient has noted bilateral nipple discharge over the past 2 months. Physical exam yielded no significant abnormalities to her breast tissue. No discharge noted on exam. - We'll obtain prolactin levels today as well as other laboratory tests. - Follow-up in 1-2 weeks to go over results.  Obesity BMI in obesity class II range. Only a 3 pound weight gain since last year. - Mediterranean diet  guidelines and counseling provided today.   Orders Placed This Encounter  Procedures  . BASIC METABOLIC PANEL WITH GFR  . CBC  . TSH  . Prolactin  . POCT urine pregnancy    Meds ordered this encounter  Medications  . hydrochlorothiazide (HYDRODIURIL) 25 MG tablet    Sig: Take 1 tablet (25 mg total) by mouth daily.    Dispense:  90 tablet    Refill:  3     Elberta Leatherwood, MD,MS,  PGY3 02/10/2016 7:03 PM

## 2016-02-10 NOTE — Assessment & Plan Note (Signed)
BMI in obesity class II range. Only a 3 pound weight gain since last year. - Mediterranean diet guidelines and counseling provided today.

## 2016-02-10 NOTE — Assessment & Plan Note (Signed)
Patient has noted bilateral nipple discharge over the past 2 months. Physical exam yielded no significant abnormalities to her breast tissue. No discharge noted on exam. - We'll obtain prolactin levels today as well as other laboratory tests. - Follow-up in 1-2 weeks to go over results.

## 2016-02-18 LAB — CYTOLOGY - PAP

## 2016-02-19 ENCOUNTER — Encounter (HOSPITAL_COMMUNITY): Payer: Self-pay

## 2016-02-19 ENCOUNTER — Emergency Department (HOSPITAL_COMMUNITY)
Admission: EM | Admit: 2016-02-19 | Discharge: 2016-02-19 | Disposition: A | Discharge status: Home / Self Care | Payer: Medicaid Other | Attending: Emergency Medicine | Admitting: Emergency Medicine

## 2016-02-19 ENCOUNTER — Emergency Department (HOSPITAL_COMMUNITY): Payer: Medicaid Other

## 2016-02-19 DIAGNOSIS — I10 Essential (primary) hypertension: Secondary | ICD-10-CM | POA: Insufficient documentation

## 2016-02-19 DIAGNOSIS — Z79899 Other long term (current) drug therapy: Secondary | ICD-10-CM | POA: Insufficient documentation

## 2016-02-19 DIAGNOSIS — J45909 Unspecified asthma, uncomplicated: Secondary | ICD-10-CM | POA: Diagnosis not present

## 2016-02-19 DIAGNOSIS — R109 Unspecified abdominal pain: Secondary | ICD-10-CM | POA: Diagnosis present

## 2016-02-19 DIAGNOSIS — N390 Urinary tract infection, site not specified: Secondary | ICD-10-CM | POA: Insufficient documentation

## 2016-02-19 DIAGNOSIS — F17210 Nicotine dependence, cigarettes, uncomplicated: Secondary | ICD-10-CM | POA: Diagnosis not present

## 2016-02-19 LAB — COMPREHENSIVE METABOLIC PANEL
ALBUMIN: 3.7 g/dL (ref 3.5–5.0)
ALT: 11 U/L — ABNORMAL LOW (ref 14–54)
ANION GAP: 6 (ref 5–15)
AST: 15 U/L (ref 15–41)
Alkaline Phosphatase: 63 U/L (ref 38–126)
BILIRUBIN TOTAL: 0.4 mg/dL (ref 0.3–1.2)
BUN: 13 mg/dL (ref 6–20)
CHLORIDE: 106 mmol/L (ref 101–111)
CO2: 26 mmol/L (ref 22–32)
Calcium: 8.8 mg/dL — ABNORMAL LOW (ref 8.9–10.3)
Creatinine, Ser: 0.72 mg/dL (ref 0.44–1.00)
GFR calc Af Amer: >60 mL/min (ref >60)
GFR calc non Af Amer: >60 mL/min (ref >60)
GLUCOSE: 90 mg/dL (ref 65–99)
POTASSIUM: 3.9 mmol/L (ref 3.5–5.1)
SODIUM: 138 mmol/L (ref 135–145)
TOTAL PROTEIN: 6.7 g/dL (ref 6.5–8.1)

## 2016-02-19 LAB — CBC
HEMATOCRIT: 35.8 % — AB (ref 36.0–46.0)
HEMOGLOBIN: 12.1 g/dL (ref 12.0–15.0)
MCH: 31.3 pg (ref 26.0–34.0)
MCHC: 33.8 g/dL (ref 30.0–36.0)
MCV: 92.7 fL (ref 78.0–100.0)
Platelets: 271 10*3/uL (ref 150–400)
RBC: 3.86 MIL/uL — ABNORMAL LOW (ref 3.87–5.11)
RDW: 13 % (ref 11.5–15.5)
WBC: 4.6 10*3/uL (ref 4.0–10.5)

## 2016-02-19 LAB — URINALYSIS, ROUTINE W REFLEX MICROSCOPIC
BILIRUBIN URINE: NEGATIVE
Glucose, UA: NEGATIVE mg/dL
Hgb urine dipstick: NEGATIVE
Ketones, ur: NEGATIVE mg/dL
NITRITE: POSITIVE — AB
PH: 6 (ref 5.0–8.0)
Protein, ur: NEGATIVE mg/dL
SPECIFIC GRAVITY, URINE: 1.028 (ref 1.005–1.030)

## 2016-02-19 LAB — URINE MICROSCOPIC-ADD ON

## 2016-02-19 LAB — I-STAT BETA HCG BLOOD, ED (MC, WL, AP ONLY): I-stat hCG, quantitative: <5 m[IU]/mL (ref <5)

## 2016-02-19 LAB — LIPASE, BLOOD: LIPASE: 19 U/L (ref 11–51)

## 2016-02-19 MED ORDER — CEPHALEXIN 500 MG PO CAPS
500.0000 mg | ORAL_CAPSULE | Freq: Once | ORAL | Status: AC
  Administered 2016-02-19: 500 mg via ORAL
  Filled 2016-02-19: qty 1

## 2016-02-19 MED ORDER — SODIUM CHLORIDE 0.9 % IV SOLN: INTRAVENOUS | Status: DC

## 2016-02-19 MED ORDER — OXYCODONE-ACETAMINOPHEN 5-325 MG PO TABS: 1.0000 | ORAL_TABLET | ORAL | 0 refills | Status: DC | PRN

## 2016-02-19 MED ORDER — ONDANSETRON HCL 4 MG/2ML IJ SOLN: 4.0000 mg | Freq: Once | INTRAMUSCULAR | Status: DC

## 2016-02-19 MED ORDER — CEPHALEXIN 500 MG PO CAPS: 500.0000 mg | ORAL_CAPSULE | Freq: Four times a day (QID) | ORAL | 0 refills | Status: DC

## 2016-02-19 MED ORDER — OXYCODONE-ACETAMINOPHEN 5-325 MG PO TABS
1.0000 | ORAL_TABLET | Freq: Once | ORAL | Status: AC
  Administered 2016-02-19: 1 via ORAL
  Filled 2016-02-19: qty 1

## 2016-02-19 MED ORDER — FENTANYL CITRATE (PF) 100 MCG/2ML IJ SOLN: 100.0000 ug | INTRAMUSCULAR | Status: DC | PRN

## 2016-02-19 NOTE — ED Notes (Signed)
MD at bedside. 

## 2016-02-19 NOTE — ED Provider Notes (Signed)
[facility] DEPT Provider Note   CSN: [id number] Arrival date & time: 02/19/16  1351     History   Chief Complaint Chief Complaint  Patient presents with  . Abdominal Pain    HPI Terri [name] is a 30 y.o. female.  She presents for evaluation of right  sided abdominal pain radiating to the right flank, constant since yesterday. She states this feels exactly like a kidney stone that she had several months ago. She has never seen a urologist. She's had decreased urine frequency, and poor appetite for 2 days. She denies fever, chills, nausea, vomiting, cough, shortness of breath or chest pain. There are no other known modifying factors.  HPI  Past Medical History:  Diagnosis Date  . Asthma   . Hypertension   . Hypertention, malignant, with acute intensive management     Patient Active Problem List   Diagnosis Date Noted  . Nipple discharge 02/10/2016  . Migraine 11/12/2014  . Itchy skin 11/12/2014  . Cough 11/12/2014  . Intermittent palpitations 03/03/2014  . Mixed incontinence 10/03/2013  . Nephrolithiasis 06/18/2013  . Unspecified constipation 02/11/2013  . Screening for STD (sexually transmitted disease) 09/25/2011  . Contraception management 08/31/2011  . Irregular menstrual bleeding 08/29/2011  . Unspecified episodic mood disorder 04/05/2011  . TOBACCO USER 06/14/2009  . Obesity 01/05/2009  . HYPERTENSION, BENIGN ESSENTIAL 05/14/2007  . Recurrent boils 02/18/2007  . ASTHMA, INTERMITTENT 08/09/2006    History reviewed. No pertinent surgical history.  OB History    Gravida Para Term Preterm AB Living   1 1 1     1    SAB TAB Ectopic Multiple Live Births           1       Home Medications    Prior to Admission medications   Medication Sig Start Date End Date Taking? Authorizing Provider  benzonatate (TESSALON) 100 MG capsule Take 1 capsule (100 mg total) by mouth 3 (three) times daily as needed for cough. Patient not taking: Reported on 08/10/2015  11/12/14   Coral Spikes, DO  hydrochlorothiazide (HYDRODIURIL) 25 MG tablet Take 1 tablet (25 mg total) by mouth daily. 02/10/16   Elberta Leatherwood, MD  SUMAtriptan (IMITREX) 50 MG tablet Take 1 tablet (50 mg total) by mouth every 2 (two) hours as needed for migraine. May repeat x 1 in 2 hours if headache persists or recurs. 08/10/15   Domenic Moras, PA-C    Family History History reviewed. No pertinent family history.  Social History Social History  Substance Use Topics  . Smoking status: Current Every Day Smoker    Packs/day: 0.20    Types: Cigarettes  . Smokeless tobacco: Never Used  . Alcohol use Yes     Comment: occassionally     Allergies   Review of patient's allergies indicates no known allergies.   Review of Systems Review of Systems  All other systems reviewed and are negative.    Physical Exam Updated Vital Signs BP 136/99 (BP Location: Left Arm)   Pulse 92   Temp 98.6 F (37 C) (Oral)   Resp 18   LMP 02/03/2016   SpO2 96%   Physical Exam  Constitutional: She is oriented to person, place, and time. She appears well-developed and well-nourished.  HENT:  Head: Normocephalic and atraumatic.  Eyes: Conjunctivae and EOM are normal. Pupils are equal, round, and reactive to light.  Neck: Normal range of motion and phonation normal. Neck supple.  Cardiovascular: Normal rate and regular  rhythm.   Pulmonary/Chest: Effort normal and breath sounds normal. She exhibits no tenderness.  Abdominal: Soft. She exhibits no distension and no mass. There is tenderness (Right upper and lower, mild). There is no rebound and no guarding.  Genitourinary:  Genitourinary Comments: No costovertebral angle tenderness.  Musculoskeletal: Normal range of motion.  Neurological: She is alert and oriented to person, place, and time. She exhibits normal muscle tone.  Skin: Skin is warm and dry.  Psychiatric: She has a normal mood and affect. Her behavior is normal. Judgment and thought content  normal.  Nursing note and vitals reviewed.    ED Treatments / Results  Labs (all labs ordered are listed, but only abnormal results are displayed) Labs Reviewed  LIPASE, BLOOD  COMPREHENSIVE METABOLIC PANEL  CBC  URINALYSIS, ROUTINE W REFLEX MICROSCOPIC (NOT AT [facility])  I-STAT BETA HCG BLOOD, ED ([facility], [facility], [facility] ONLY)    EKG  EKG Interpretation None       Radiology No results found.  Procedures Procedures (including critical care time)  Medications Ordered in ED Medications  ondansetron (ZOFRAN) injection 4 mg (not administered)  0.9 %  sodium chloride infusion (not administered)  fentaNYL (SUBLIMAZE) injection 100 mcg (not administered)     Initial Impression / Assessment and Plan / ED Course  I have reviewed the triage vital signs and the nursing notes.  Pertinent labs & imaging results that were available during my care of the patient were reviewed by me and considered in my medical decision making (see chart for details).  Clinical Course    Medications  ondansetron (ZOFRAN) injection 4 mg (4 mg Intravenous Refused 02/19/16 1508)  0.9 %  sodium chloride infusion ( Intravenous Refused 02/19/16 1509)  fentaNYL (SUBLIMAZE) injection 100 mcg (not administered)  cephALEXin (KEFLEX) capsule 500 mg (not administered)  oxyCODONE-acetaminophen (PERCOCET/ROXICET) 5-325 MG per tablet 1 tablet (1 tablet Oral Given 02/19/16 1459)    Patient Vitals for the past 24 hrs:  BP Temp Temp src Pulse Resp SpO2  02/19/16 1400 136/99 98.6 F (37 C) Oral 92 18 96 %    4:01 PM Reevaluation with update and discussion. After initial assessment and treatment, an updated evaluation reveals She is feeling somewhat better. Findings discussed with the patient and all questions were answered. Alik Mawson L    Final Clinical Impressions(s) / ED Diagnoses   Final diagnoses:  Flank pain  UTI (lower urinary tract infection)   Nonspecific flank and abdominal pain, with increased stool in the  right colon, raising the possibility of intestinal cramping is the source of her discomfort. Urinalysis is abnormal, possibly consistent with infection. However, she does not have urinary tract symptoms. Doubt serious bacterial infection, metabolic instability or impending vascular collapse.   Nursing Notes Reviewed/ Care Coordinated Applicable Imaging Reviewed Interpretation of Laboratory Data incorporated into ED treatment  The patient appears reasonably screened and/or stabilized for discharge and I doubt any other medical condition or other [facility] requiring further screening, evaluation, or treatment in the ED at this time prior to discharge.  Plan: Home Medications- Colace BID x 2 weeks, continue usual; Home Treatments- rest, fluids; return here if the recommended treatment, does not improve the symptoms; Recommended follow up- PCP prn    New Prescriptions New Prescriptions   No medications on file     Daleen Bo, MD 02/19/16 [phone]

## 2016-02-19 NOTE — Discharge Instructions (Signed)
It is not clear what is the exact source of your pain.  You probably have a urinary tract infection. We are going to prescribe an antibiotic to treat that.  There is increased amount of stool in your right colon. To treat that, use Colace, a stool softener, twice a day for 2 weeks, to improve bowel movements.  Watch for worsening pain, fever, vomiting or other problems.  Follow-up with the doctor of your choice as needed.

## 2016-02-19 NOTE — ED Triage Notes (Signed)
Pt here with rt lower abdominal pain.  No n/v.  No fever.  Pt states "pulling sensations" with urination. Denies vaginal discharge.

## 2016-02-28 ENCOUNTER — Ambulatory Visit: Payer: Self-pay | Admitting: Internal Medicine

## 2016-05-31 ENCOUNTER — Ambulatory Visit: Payer: Self-pay | Admitting: Family Medicine

## 2016-09-19 ENCOUNTER — Encounter: Payer: Medicaid Other | Admitting: Internal Medicine

## 2017-01-26 ENCOUNTER — Inpatient Hospital Stay (HOSPITAL_COMMUNITY)
Admission: AD | Admit: 2017-01-26 | Discharge: 2017-01-26 | Disposition: A | Discharge status: Home / Self Care | Payer: Medicaid Other | Source: Ambulatory Visit | Attending: Obstetrics and Gynecology | Admitting: Obstetrics and Gynecology

## 2017-01-26 ENCOUNTER — Ambulatory Visit: Payer: Medicaid Other | Admitting: Internal Medicine

## 2017-01-26 DIAGNOSIS — N390 Urinary tract infection, site not specified: Secondary | ICD-10-CM

## 2017-01-26 DIAGNOSIS — Z3202 Encounter for pregnancy test, result negative: Secondary | ICD-10-CM | POA: Diagnosis not present

## 2017-01-26 DIAGNOSIS — N898 Other specified noninflammatory disorders of vagina: Secondary | ICD-10-CM | POA: Diagnosis present

## 2017-01-26 DIAGNOSIS — N760 Acute vaginitis: Secondary | ICD-10-CM | POA: Insufficient documentation

## 2017-01-26 DIAGNOSIS — F17210 Nicotine dependence, cigarettes, uncomplicated: Secondary | ICD-10-CM | POA: Insufficient documentation

## 2017-01-26 DIAGNOSIS — B9689 Other specified bacterial agents as the cause of diseases classified elsewhere: Secondary | ICD-10-CM | POA: Diagnosis not present

## 2017-01-26 DIAGNOSIS — L732 Hidradenitis suppurativa: Secondary | ICD-10-CM | POA: Insufficient documentation

## 2017-01-26 LAB — WET PREP, GENITAL
SPERM: NONE SEEN
TRICH WET PREP: NONE SEEN
YEAST WET PREP: NONE SEEN

## 2017-01-26 LAB — URINALYSIS, ROUTINE W REFLEX MICROSCOPIC
Bilirubin Urine: NEGATIVE
GLUCOSE, UA: NEGATIVE mg/dL
Hgb urine dipstick: NEGATIVE
Ketones, ur: NEGATIVE mg/dL
Nitrite: POSITIVE — AB
PH: 5 (ref 5.0–8.0)
Protein, ur: 30 mg/dL — AB
SPECIFIC GRAVITY, URINE: 1.023 (ref 1.005–1.030)
SQUAMOUS EPITHELIAL / LPF: NONE SEEN

## 2017-01-26 LAB — POCT PREGNANCY, URINE: Preg Test, Ur: NEGATIVE

## 2017-01-26 MED ORDER — METRONIDAZOLE 500 MG PO TABS: 500.0000 mg | ORAL_TABLET | Freq: Two times a day (BID) | ORAL | 0 refills | Status: DC

## 2017-01-26 MED ORDER — SULFAMETHOXAZOLE-TRIMETHOPRIM 800-160 MG PO TABS: 1.0000 | ORAL_TABLET | Freq: Two times a day (BID) | ORAL | 0 refills | Status: AC

## 2017-01-26 NOTE — MAU Note (Signed)
Patient presents with abdominal pain which comes and goes for a week, vaginal discharge x 2 days, having boil type break outs between her legs and other areas of her body.

## 2017-01-26 NOTE — Discharge Instructions (Signed)
Hidradenitis Suppurativa Hidradenitis suppurativa is a long-term (chronic) skin disease that starts with blocked sweat glands or hair follicles. Bacteria may grow in these blocked openings of your skin. Hidradenitis suppurativa is like a severe form of acne that develops in areas of your body where acne would be unusual. It is most likely to affect the areas of your body where skin rubs against skin and becomes moist. This includes your:  Underarms.  Groin.  Genital areas.  Buttocks.  Upper thighs.  Breasts.  Hidradenitis suppurativa may start out with small pimples. The pimples can develop into deep sores that break open (rupture) and drain pus. Over time your skin may thicken and become scarred. Hidradenitis suppurativa cannot be passed from person to person. What are the causes? The exact cause of hidradenitis suppurativa is not known. This condition may be due to:  Female and female hormones. The condition is rare before and after puberty.  An overactive body defense system (immune system). Your immune system may overreact to the blocked hair follicles or sweat glands and cause swelling and pus-filled sores.  What increases the risk? You may have a higher risk of hidradenitis suppurativa if you:  Are a woman.  Are between ages 11 and 55.  Have a family history of hidradenitis suppurativa.  Have a personal history of acne.  Are overweight.  Smoke.  Take the drug lithium.  What are the signs or symptoms? The first signs of an outbreak are usually painful skin bumps that look like pimples. As the condition progresses:  Skin bumps may get bigger and grow deeper into the skin.  Bumps under the skin may rupture and drain smelly pus.  Skin may become itchy and infected.  Skin may thicken and scar.  Drainage may continue through tunnels under the skin (fistulas).  Walking and moving your arms can become painful.  How is this diagnosed? Your health care provider may  diagnose hidradenitis suppurativa based on your medical history and your signs and symptoms. A physical exam will also be done. You may need to see a health care provider who specializes in skin diseases (dermatologist). You may also have tests done to confirm the diagnosis. These can include:  Swabbing a sample of pus or drainage from your skin so it can be sent to the lab and tested for infection.  Blood tests to check for infection.  How is this treated? The same treatment will not work for everybody with hidradenitis suppurativa. Your treatment will depend on how severe your symptoms are. You may need to try several treatments to find what works best for you. Part of your treatment may include cleaning and bandaging (dressing) your wounds. You may also have to take medicines, such as the following:  Antibiotics.  Acne medicines.  Medicines to block or suppress the immune system.  A diabetes medicine (metformin) is sometimes used to treat this condition.  For women, birth control pills can sometimes help relieve symptoms.  You may need surgery if you have a severe case of hidradenitis suppurativa that does not respond to medicine. Surgery may involve:  Using a laser to clear the skin and remove hair follicles.  Opening and draining deep sores.  Removing the areas of skin that are diseased and scarred.  Follow these instructions at home:  Learn as much as you can about your disease, and work closely with your health care providers.  Take medicines only as directed by your health care provider.  If you were prescribed   an antibiotic medicine, finish it all even if you start to feel better.  If you are overweight, losing weight may be very helpful. Try to reach and maintain a healthy weight.  Do not use any tobacco products, including cigarettes, chewing tobacco, or electronic cigarettes. If you need help quitting, ask your health care provider.  Do not shave the areas where you  get hidradenitis suppurativa.  Do not wear deodorant.  Wear loose-fitting clothes.  Try not to overheat and get sweaty.  Take a daily bleach bath as directed by your health care provider. ? Fill your bathtub halfway with water. ? Pour in  cup of unscented household bleach. ? Soak for 5-10 minutes.  Cover sore areas with a warm, clean washcloth (compress) for 5-10 minutes. Contact a health care provider if:  You have a flare-up of hidradenitis suppurativa.  You have chills or a fever.  You are having trouble controlling your symptoms at home. This information is not intended to replace advice given to you by your health care provider. Make sure you discuss any questions you have with your health care provider. Document Released: 01/11/2004 Document Revised: 11/04/2015 Document Reviewed: 08/29/2013 [employer] Interactive Patient Education  2018 [employer]  

## 2017-01-26 NOTE — MAU Provider Note (Signed)
History     CSN: [id number]  Arrival date and time: 01/26/17 1813   First Provider Initiated Contact with Patient 01/26/17 1841      Chief Complaint  Patient presents with  . Abdominal Injury  . Vaginal Discharge  . Rash   HPI   Terri [name] is a 31 y.o. female G1P1001 here in MAU with with multiple complaints. She is complainig of vaginal discharge that she noticed 2 days ago. The discharge is clear/white. The discharge has an odor. 1 new sexual partner. She also complains of boils all over her body; near her breasts and in her groin that she has noticed for years. She had an appointment with her primary care Dr. Today at [facility], however she over slept and canceled her appointment and came here instead. She plans to follow up with them on Monday. Some mid-lower abdominal pain, no fever, no N/V.   She has run out of her BP medication.   OB History    Gravida Para Term Preterm AB Living   1 1 1     1    SAB TAB Ectopic Multiple Live Births           1      Past Medical History:  Diagnosis Date  . Asthma   . Hypertension   . Hypertention, malignant, with acute intensive management     No past surgical history on file.  No family history on file.  Social History  Substance Use Topics  . Smoking status: Current Every Day Smoker    Packs/day: 0.20    Types: Cigarettes  . Smokeless tobacco: Never Used  . Alcohol use Yes     Comment: occassionally    Allergies: No Known Allergies  Prescriptions Prior to Admission  Medication Sig Dispense Refill Last Dose  . benzonatate (TESSALON) 100 MG capsule Take 1 capsule (100 mg total) by mouth 3 (three) times daily as needed for cough. (Patient not taking: Reported on 08/10/2015) 30 capsule 0 Completed Course at Unknown time  . cephALEXin (KEFLEX) 500 MG capsule Take 1 capsule (500 mg total) by mouth 4 (four) times daily. 20 capsule 0   . hydrochlorothiazide (HYDRODIURIL) 25 MG tablet Take 1 tablet (25 mg  total) by mouth daily. 90 tablet 3 Past Week at Unknown time  . oxyCODONE-acetaminophen (PERCOCET) 5-325 MG tablet Take 1 tablet by mouth every 4 (four) hours as needed. 10 tablet 0   . SUMAtriptan (IMITREX) 50 MG tablet Take 1 tablet (50 mg total) by mouth every 2 (two) hours as needed for migraine. May repeat x 1 in 2 hours if headache persists or recurs. 20 tablet 0 unknown   Results for orders placed or performed during the hospital encounter of 01/26/17 (from the past 48 hour(s))  Urinalysis, Routine w reflex microscopic     Status: Abnormal   Collection Time: 01/26/17  6:23 PM  Result Value Ref Range   Color, Urine YELLOW YELLOW   APPearance CLOUDY (A) CLEAR   Specific Gravity, Urine 1.023 1.005 - 1.030   pH 5.0 5.0 - 8.0   Glucose, UA NEGATIVE NEGATIVE mg/dL   Hgb urine dipstick NEGATIVE NEGATIVE   Bilirubin Urine NEGATIVE NEGATIVE   Ketones, ur NEGATIVE NEGATIVE mg/dL   Protein, ur 30 (A) NEGATIVE mg/dL   Nitrite POSITIVE (A) NEGATIVE   Leukocytes, UA LARGE (A) NEGATIVE   RBC / HPF 6-30 0 - 5 RBC/hpf   WBC, UA TOO NUMEROUS TO COUNT 0 - 5  WBC/hpf   Bacteria, UA MANY (A) NONE SEEN   Squamous Epithelial / LPF NONE SEEN NONE SEEN   Mucous PRESENT   Pregnancy, urine POC     Status: None   Collection Time: 01/26/17  6:41 PM  Result Value Ref Range   Preg Test, Ur NEGATIVE NEGATIVE    Comment:        THE SENSITIVITY OF THIS METHODOLOGY IS >24 mIU/mL   Wet prep, genital     Status: Abnormal   Collection Time: 01/26/17  7:32 PM  Result Value Ref Range   Yeast Wet Prep HPF POC NONE SEEN NONE SEEN   Trich, Wet Prep NONE SEEN NONE SEEN   Clue Cells Wet Prep HPF POC PRESENT (A) NONE SEEN   WBC, Wet Prep HPF POC MODERATE (A) NONE SEEN    Comment: MANY BACTERIA SEEN   Sperm NONE SEEN     Review of Systems  Genitourinary: Positive for urgency. Negative for dysuria.  Skin:       Multiple boils in groin area, and underarms   Neurological: Positive for headaches.   Physical Exam    Blood pressure (!) 148/95, pulse 90, temperature 98.5 F (36.9 C), temperature source Oral, resp. rate 16, height 5\' 8"  (1.727 m), weight 242 lb (109.8 kg), last menstrual period 01/18/2017.  Physical Exam  Constitutional: She is oriented to person, place, and time. She appears well-developed and well-nourished. No distress.  GI: Soft. Normal appearance. There is tenderness in the suprapubic area. There is no rigidity, no rebound and no guarding.  Genitourinary:  Genitourinary Comments: Wet prep and GC collected without speculum.   Neurological: She is alert and oriented to person, place, and time.  Skin: Skin is warm, dry and intact. Lesion noted. She is not diaphoretic.  Several boils in the groin and underarms. Non tender, no drainage or odor. No erythema.   Psychiatric: Her behavior is normal.   MAU Course  Procedures  None  MDM  Patient declines HIV testing.  UA Wet prep and GC   Assessment and Plan   A:  1. Hidradenitis suppurativa   2. Acute UTI   3. BV (bacterial vaginosis)     P:  Discharge home in stable condition Rx: Bactrim, Flagyl (no alcohol) Return to MAU for GYN emergencies only Follow up with PCP- patient has an appointment on Monday  Recommend a visit to dermatologist    Rasch, Artist Pais, NP 01/26/2017 8:49 PM

## 2017-01-28 NOTE — Progress Notes (Signed)
   Subjective:   Patient ID: Terri [name]    DOB: 03-21-1986, 31 y.o. female   MRN: [id number]  CC: "MAU follow-up"  HPI: [name] is a 31 y.o. female who presents to clinic today for MAU follow-up. Problems discussed today are as follows:  Hidradenitis supperativa: Patient experiencing boils throughout her axilla, buttocks, and groin region. Previously had appointment at [facility] but missed appointment due to work. Subsequently followed with MAU who diagnosed patient with hidradenitis and recommended following up with PCP. Patient taking warm baths and ibuprofen occasionally with minimal improvement.  ROS: Denies fevers or chills, nausea or vomiting, purulent drainage.  Bacteriuria: Urine tested positive for bacteria during visit at MAU. No previous history of UTIs. ROS: Denies polyuria, dysuria, hematuria.  Hypertension: Patient ran out of HCTZ 3-4 weeks ago. Has history of smoking. Does not check her blood pressure regularly. Has not established care with her new PCP. ROS: Denies chest pain, shortness of breath, palpitations.  Complete ROS performed, see HPI for pertinent.  [place]: Hidradenitis suppurativa, tobacco use disorder, HTN, obesity. Smoking status reviewed. Medications reviewed.  Objective:   BP (!) 132/100   Temp 98.2 F (36.8 C) (Oral)   Wt 243 lb (110.2 kg)   LMP 01/18/2017   BMI 36.95 kg/m  Vitals and nursing note reviewed.  General: obese, well nourished, well developed, in no acute distress with non-toxic appearance CV: regular rate and rhythm without murmurs, rubs, or gallops, no lower extremity edema Lungs: clear to auscultation bilaterally with normal work of breathing GU: accompanied by chaperone, diffuse minimally erythematous papules on inner thigh bilaterally near groin with mild scarring consistent with hidradenitis without purulence or abscess formation Skin: warm, dry, cap refill < 2 seconds Extremities: warm and well perfused, normal  tone  Assessment & Plan:   Primary hypertension Chronic. Uncontrolled. Prescribed HCTZ, however patient ran out and does not follow a PCP regularly. --Refill HCTZ --RTC with PCP in approximately 2 weeks  Suppurative hidradenitis Chronic. Hurley stage I. Affecting axilla bilaterally, groin, buttocks. --Advised patient to complete current antibiotic regimen and follow-up in 2 weeks for reassessment and consideration for topical antibiotic versus doxycycline PO  Bacteriuria Reviewed. Asymptomatic. Diagnosed in MAU without culture. Started on Bactrim. --Patient to complete course of Bactrim and follow-up with next appointment --Could consider urine culture if symptoms persist  No orders of the defined types were placed in this encounter.  Meds ordered this encounter  Medications  . hydrochlorothiazide (HYDRODIURIL) 25 MG tablet    Sig: Take 1 tablet (25 mg total) by mouth daily.    Dispense:  90 tablet    Refill:  [place], [place], PGY-2 01/29/2017 12:38 PM

## 2017-01-29 ENCOUNTER — Encounter: Payer: Self-pay | Admitting: Family Medicine

## 2017-01-29 ENCOUNTER — Ambulatory Visit (INDEPENDENT_AMBULATORY_CARE_PROVIDER_SITE_OTHER): Payer: Medicaid Other | Admitting: Family Medicine

## 2017-01-29 VITALS — BP 132/100 | Temp 98.2°F | Wt 243.0 lb

## 2017-01-29 DIAGNOSIS — L732 Hidradenitis suppurativa: Secondary | ICD-10-CM

## 2017-01-29 DIAGNOSIS — I10 Essential (primary) hypertension: Secondary | ICD-10-CM

## 2017-01-29 DIAGNOSIS — R8271 Bacteriuria: Secondary | ICD-10-CM

## 2017-01-29 LAB — GC/CHLAMYDIA PROBE AMP (~~LOC~~) NOT AT ARMC
Chlamydia: NEGATIVE
Neisseria Gonorrhea: NEGATIVE

## 2017-01-29 MED ORDER — HYDROCHLOROTHIAZIDE 25 MG PO TABS: 25.0000 mg | ORAL_TABLET | Freq: Every day | ORAL | 3 refills | Status: DC

## 2017-01-29 NOTE — Assessment & Plan Note (Addendum)
Chronic. Uncontrolled. Prescribed HCTZ, however patient ran out and does not follow a PCP regularly. --Refill HCTZ --RTC with PCP in approximately 2 weeks

## 2017-01-29 NOTE — Assessment & Plan Note (Addendum)
Chronic. Hurley stage I. Affecting axilla bilaterally, groin, buttocks. --Advised patient to complete current antibiotic regimen and follow-up in 2 weeks for reassessment and consideration for topical antibiotic versus doxycycline PO

## 2017-01-29 NOTE — Patient Instructions (Signed)
Thank you for coming in to see [place] today. Please see below to review our plan for today's visit.  1. I would like you to complete her antibiotic and antifungal at this time. We will have you follow up in 2 weeks and will reassess her hidradenitis. See below for information on this. 2. You are good on your Pap smears until 01/2019. We will recheck your urine if you continue to have symptoms during your next visit. 3. I have sent in a prescription of HCTZ to your pharmacy. Please pick this up and take as prescribed.  Return to clinic to see your PCP at next available appointment, preferably in the next 2 weeks.  Please call the clinic at [phone] if your symptoms worsen or you have any concerns. It was my pleasure to see you. -- Harriet [place], [place], PGY-2  Hidradenitis Suppurativa Hidradenitis suppurativa is a long-term (chronic) skin disease that starts with blocked sweat glands or hair follicles. Bacteria may grow in these blocked openings of your skin. Hidradenitis suppurativa is like a severe form of acne that develops in areas of your body where acne would be unusual. It is most likely to affect the areas of your body where skin rubs against skin and becomes moist. This includes your:  Underarms.  Groin.  Genital areas.  Buttocks.  Upper thighs.  Breasts.  Hidradenitis suppurativa may start out with small pimples. The pimples can develop into deep sores that break open (rupture) and drain pus. Over time your skin may thicken and become scarred. Hidradenitis suppurativa cannot be passed from person to person. What are the causes? The exact cause of hidradenitis suppurativa is not known. This condition may be due to:  Female and female hormones. The condition is rare before and after puberty.  An overactive body defense system (immune system). Your immune system may overreact to the blocked hair follicles or sweat glands and cause swelling and  pus-filled sores.  What increases the risk? You may have a higher risk of hidradenitis suppurativa if you:  Are a woman.  Are between ages 65 and 10.  Have a family history of hidradenitis suppurativa.  Have a personal history of acne.  Are overweight.  Smoke.  Take the drug lithium.  What are the signs or symptoms? The first signs of an outbreak are usually painful skin bumps that look like pimples. As the condition progresses:  Skin bumps may get bigger and grow deeper into the skin.  Bumps under the skin may rupture and drain smelly pus.  Skin may become itchy and infected.  Skin may thicken and scar.  Drainage may continue through tunnels under the skin (fistulas).  Walking and moving your arms can become painful.  How is this diagnosed? Your health care provider may diagnose hidradenitis suppurativa based on your medical history and your signs and symptoms. A physical exam will also be done. You may need to see a health care provider who specializes in skin diseases (dermatologist). You may also have tests done to confirm the diagnosis. These can include:  Swabbing a sample of pus or drainage from your skin so it can be sent to the lab and tested for infection.  Blood tests to check for infection.  How is this treated? The same treatment will not work for everybody with hidradenitis suppurativa. Your treatment will depend on how severe your symptoms are. You may need to try several treatments to find what works best for you. Part  of your treatment may include cleaning and bandaging (dressing) your wounds. You may also have to take medicines, such as the following:  Antibiotics.  Acne medicines.  Medicines to block or suppress the immune system.  A diabetes medicine (metformin) is sometimes used to treat this condition.  For women, birth control pills can sometimes help relieve symptoms.  You may need surgery if you have a severe case of hidradenitis  suppurativa that does not respond to medicine. Surgery may involve:  Using a laser to clear the skin and remove hair follicles.  Opening and draining deep sores.  Removing the areas of skin that are diseased and scarred.  Follow these instructions at home:  Learn as much as you can about your disease, and work closely with your health care providers.  Take medicines only as directed by your health care provider.  If you were prescribed an antibiotic medicine, finish it all even if you start to feel better.  If you are overweight, losing weight may be very helpful. Try to reach and maintain a healthy weight.  Do not use any tobacco products, including cigarettes, chewing tobacco, or electronic cigarettes. If you need help quitting, ask your health care provider.  Do not shave the areas where you get hidradenitis suppurativa.  Do not wear deodorant.  Wear loose-fitting clothes.  Try not to overheat and get sweaty.  Take a daily bleach bath as directed by your health care provider. ? Fill your bathtub halfway with water. ? Pour in  cup of unscented household bleach. ? Soak for 5-10 minutes.  Cover sore areas with a warm, clean washcloth (compress) for 5-10 minutes. Contact a health care provider if:  You have a flare-up of hidradenitis suppurativa.  You have chills or a fever.  You are having trouble controlling your symptoms at home. This information is not intended to replace advice given to you by your health care provider. Make sure you discuss any questions you have with your health care provider. Document Released: 01/11/2004 Document Revised: 11/04/2015 Document Reviewed: 08/29/2013 Elsevier Interactive Patient Education  2018 [employer].

## 2017-01-29 NOTE — Assessment & Plan Note (Addendum)
Reviewed. Asymptomatic. Diagnosed in MAU without culture. Started on Bactrim. --Patient to complete course of Bactrim and follow-up with next appointment --Could consider urine culture if symptoms persist

## 2017-02-14 ENCOUNTER — Ambulatory Visit: Payer: Self-pay | Admitting: Internal Medicine

## 2017-02-23 ENCOUNTER — Ambulatory Visit: Payer: Self-pay | Admitting: Internal Medicine

## 2017-04-10 ENCOUNTER — Telehealth: Payer: Self-pay | Admitting: Internal Medicine

## 2017-04-10 NOTE — Telephone Encounter (Signed)
Pt started a new job and is requesting documentation of her bp med - hydrodiuril. Her work needs to know the side effects of the medication while pt is at work. ag

## 2017-04-11 ENCOUNTER — Encounter: Payer: Self-pay | Admitting: Internal Medicine

## 2017-04-11 NOTE — Telephone Encounter (Signed)
Wrote letter and sent to admin work queue to be sent in mail to patient. If she needs sooner, please print and leave at front desk. Thank you.

## 2017-04-11 NOTE — Progress Notes (Signed)
Wrote letter, which will be sent in mail to patient. Please print and leave at front desk if she needs it sooner.  Olene Floss, MD [place], PGY-3

## 2017-04-15 NOTE — Progress Notes (Signed)
Subjective:    Patient ID: Terri [name], female    DOB: [birth year], 31 y.o.   MRN: [id number]   CC: BP follow up  HPI: Hypertension: - Medications: HCTZ 25 mg daily  - Compliance: takes daily, does not take prior to coming to doctor  - Checking BP at home: does not have cuff at home. Goes to [facility] or urgent care if symptomatic  - endorses migraine headaches that occur 5 times a month. States some blurry vision when active headache as well as some nausea. States that this occurs when her BP is elevated. Patient has tried imitrex in past with no relief. Does not use ibuprofen or tylenol as they do not help her headache. Patient states when migraines occur she lays down and goes to sleep to help relieve them.  - patient states HCTZ occasionally makes her "off balance" if taken very early in the morning. States that she must take it with breakfast in order to avoid these symptoms. States that it makes her urinate more frequently as well.  - Denies any SOB, CP, vision changes, LE edema, or symptoms of hypotension - Diet: "not the best". States she does not eat fast food, fried food, or foods high in salt. States she cooks her own meals. Interested in nutrition consult as she is unable to lose weight.  - Exercise: none, states she walks a lot at work and at home.  - Smoking status reviewed 3 cigarettes a day. May consider quitting in future.  - patient not interested or willing to switch to other form of BP medications. States she has read or heard of side effects from friends/family members and wants to stay on HCTZ.   Bladder Infx Patient states she was seen in [facility] in the middle of October, possibly around 03/21/17, for HTN at which time they ordered a UA showing a UTI. States she received bactrim and "something else" and was treated for 7 days. She has finished both medications and was instructed by urgent care to come and be seen by PCP for follow up. Patient denies  dysuria, odor, color changes, hematuria, or urgency. States some frequency 2/2 HCTZ use.    Objective:  BP (!) 158/108   Pulse 82   Temp 98.5 F (36.9 C) (Oral)   Ht 5\' 8"  (1.727 m)   Wt 245 lb 12.8 oz (111.5 kg)   LMP 04/13/2017   SpO2 99%   BMI 37.37 kg/m  Vitals and nursing note reviewed  General: well nourished, in no acute distress HEENT: normocephalic, PERRL, EOMI, no AV nicking  Neck: supple, non-tender, without lymphadenopathy Cardiac: RRR, clear S1 and S2, no murmurs, rubs, or gallops Respiratory: clear to auscultation bilaterally, no increased work of breathing Abdomen: soft, nontender, nondistended, no masses or organomegaly. Bowel sounds present, no suprapubic tenderness  Extremities: no edema or cyanosis. Warm, well perfused.   Skin: warm and dry, no rashes noted Neuro: alert and oriented, no focal deficits  Assessment & Plan:    Primary hypertension Patient today hypertensive to 158/108. States she did not take her BP medications this am as she does not take them before seeing doctor. Patient refusing to try other medications at this time for BP control. Patient with associated migraines 5 times a month. Patient is interested in seeing Dr. Jenne Campus for nutrition management.  -advised to change medication, patient not agreeable -consider switching to beta blocker for both BP control and migraine treatment -referral to nutrition, phone number  given and instructed patient that she must call Dr. Jenne Campus to make an appointment. Patient verbalized understanding  -encouraged daily exercise  -encouraged healthy diet  -encouraged smoking cessation  -follow up in 1 month with PCP   Bacteriuria Patient asymptomatic. Was diagnosed in urgent care and started on bactrim, has finished course.  -no plans on treatment at this point as patient is asymptomatic, but will obtain culture. If culture is positive will call patient send treatment to pharmacy  -follow up as needed     Return in about 1 month (around 05/17/2017) for with PCP.   Caroline More, DO, PGY-1

## 2017-04-17 ENCOUNTER — Encounter: Payer: Self-pay | Admitting: Family Medicine

## 2017-04-17 ENCOUNTER — Ambulatory Visit (INDEPENDENT_AMBULATORY_CARE_PROVIDER_SITE_OTHER): Payer: Medicaid Other | Admitting: Family Medicine

## 2017-04-17 VITALS — BP 158/108 | HR 82 | Temp 98.5°F | Ht 68.0 in | Wt 245.8 lb

## 2017-04-17 DIAGNOSIS — I10 Essential (primary) hypertension: Secondary | ICD-10-CM

## 2017-04-17 DIAGNOSIS — R8271 Bacteriuria: Secondary | ICD-10-CM

## 2017-04-17 DIAGNOSIS — Z87440 Personal history of urinary (tract) infections: Secondary | ICD-10-CM | POA: Diagnosis present

## 2017-04-17 LAB — POCT URINALYSIS DIP (MANUAL ENTRY)
BILIRUBIN UA: NEGATIVE
Glucose, UA: NEGATIVE mg/dL
Nitrite, UA: POSITIVE — AB
Protein Ur, POC: 30 mg/dL — AB
SPEC GRAV UA: 1.025 (ref 1.010–1.025)
Urobilinogen, UA: 0.2 E.U./dL
pH, UA: 6 (ref 5.0–8.0)

## 2017-04-17 LAB — POCT UA - MICROSCOPIC ONLY

## 2017-04-17 NOTE — Assessment & Plan Note (Signed)
Patient today hypertensive to 158/108. States she did not take her BP medications this am as she does not take them before seeing doctor. Patient refusing to try other medications at this time for BP control. Patient with associated migraines 5 times a month. Patient is interested in seeing Dr. Jenne Campus for nutrition management.  -advised to change medication, patient not agreeable -consider switching to beta blocker for both BP control and migraine treatment -referral to nutrition, phone number given and instructed patient that she must call Dr. Jenne Campus to make an appointment. Patient verbalized understanding  -encouraged daily exercise  -encouraged healthy diet  -encouraged smoking cessation  -follow up in 1 month with PCP

## 2017-04-17 NOTE — Patient Instructions (Signed)
Hypertension Hypertension is another name for high blood pressure. High blood pressure forces your heart to work harder to pump blood. This can cause problems over time. There are two numbers in a blood pressure reading. There is a top number (systolic) over a bottom number (diastolic). It is best to have a blood pressure below 120/80. Healthy choices can help lower your blood pressure. You may need medicine to help lower your blood pressure if:  Your blood pressure cannot be lowered with healthy choices.  Your blood pressure is higher than 130/80.  Follow these instructions at home: Eating and drinking  If directed, follow the DASH eating plan. This diet includes: ? Filling half of your plate at each meal with fruits and vegetables. ? Filling one quarter of your plate at each meal with whole grains. Whole grains include whole wheat pasta, brown rice, and whole grain bread. ? Eating or drinking low-fat dairy products, such as skim milk or low-fat yogurt. ? Filling one quarter of your plate at each meal with low-fat (lean) proteins. Low-fat proteins include fish, skinless chicken, eggs, beans, and tofu. ? Avoiding fatty meat, cured and processed meat, or chicken with skin. ? Avoiding premade or processed food.  Eat less than 1,500 mg of salt (sodium) a day.  Limit alcohol use to no more than 1 drink a day for nonpregnant women and 2 drinks a day for men. One drink equals 12 oz of beer, 5 oz of wine, or 1 oz of hard liquor. Lifestyle  Work with your doctor to stay at a healthy weight or to lose weight. Ask your doctor what the best weight is for you.  Get at least 30 minutes of exercise that causes your heart to beat faster (aerobic exercise) most days of the week. This may include walking, swimming, or biking.  Get at least 30 minutes of exercise that strengthens your muscles (resistance exercise) at least 3 days a week. This may include lifting weights or pilates.  Do not use any  products that contain nicotine or tobacco. This includes cigarettes and e-cigarettes. If you need help quitting, ask your doctor.  Check your blood pressure at home as told by your doctor.  Keep all follow-up visits as told by your doctor. This is important. Medicines  Take over-the-counter and prescription medicines only as told by your doctor. Follow directions carefully.  Do not skip doses of blood pressure medicine. The medicine does not work as well if you skip doses. Skipping doses also puts you at risk for problems.  Ask your doctor about side effects or reactions to medicines that you should watch for. Contact a doctor if:  You think you are having a reaction to the medicine you are taking.  You have headaches that keep coming back (recurring).  You feel dizzy.  You have swelling in your ankles.  You have trouble with your vision. Get help right away if:  You get a very bad headache.  You start to feel confused.  You feel weak or numb.  You feel faint.  You get very bad pain in your: ? Chest. ? Belly (abdomen).  You throw up (vomit) more than once.  You have trouble breathing. Summary  Hypertension is another name for high blood pressure.  Making healthy choices can help lower blood pressure. If your blood pressure cannot be controlled with healthy choices, you may need to take medicine. This information is not intended to replace advice given to you by your health care   provider. Make sure you discuss any questions you have with your health care provider. Document Released: 11/15/2007 Document Revised: 04/26/2016 Document Reviewed: 04/26/2016 Elsevier Interactive Patient Education  [employer].  It was a pleasure meeting you today.   Today we discussed your high blood pressure and bladder infection.  For your high blood pressure I have continued your HCTZ. Please make a follow up with your PCP to discuss other options.   I have made a referral for  nutrition. Please call Dr. Jenne Campus to make an appointment.   For your UTI I have ordered a culture. If it is positive I will call and send antibiotics.   Please follow up with your PCP or sooner if symptoms persist or worsen. Please call the clinic immediately if you have any concerns.   Our clinic's number is [phone]. Please call with questions or concerns.   Thank you,  Caroline More, DO

## 2017-04-17 NOTE — Assessment & Plan Note (Signed)
Patient asymptomatic. Was diagnosed in urgent care and started on bactrim, has finished course.  -no plans on treatment at this point as patient is asymptomatic, but will obtain culture. If culture is positive will call patient send treatment to pharmacy  -follow up as needed

## 2017-04-19 ENCOUNTER — Encounter: Payer: Self-pay | Admitting: Family Medicine

## 2017-04-19 ENCOUNTER — Other Ambulatory Visit: Payer: Self-pay | Admitting: Family Medicine

## 2017-04-19 LAB — URINE CULTURE

## 2017-04-19 MED ORDER — CEPHALEXIN 500 MG PO CAPS: 500.0000 mg | ORAL_CAPSULE | Freq: Two times a day (BID) | ORAL | 0 refills | Status: AC

## 2017-04-19 NOTE — Progress Notes (Signed)
Patient's urine culture grew E.coli. Have sent keflex to pharmacy.

## 2017-04-20 ENCOUNTER — Other Ambulatory Visit: Payer: Self-pay | Admitting: Family Medicine

## 2017-05-12 ENCOUNTER — Other Ambulatory Visit: Payer: Self-pay

## 2017-05-12 ENCOUNTER — Emergency Department (HOSPITAL_COMMUNITY)
Admission: EM | Admit: 2017-05-12 | Discharge: 2017-05-12 | Disposition: A | Discharge status: Home / Self Care | Payer: Medicaid Other | Attending: Emergency Medicine | Admitting: Emergency Medicine

## 2017-05-12 ENCOUNTER — Encounter (HOSPITAL_COMMUNITY): Payer: Self-pay | Admitting: *Deleted

## 2017-05-12 ENCOUNTER — Emergency Department (HOSPITAL_COMMUNITY): Payer: Medicaid Other

## 2017-05-12 DIAGNOSIS — S4992XA Unspecified injury of left shoulder and upper arm, initial encounter: Secondary | ICD-10-CM | POA: Diagnosis present

## 2017-05-12 DIAGNOSIS — F17210 Nicotine dependence, cigarettes, uncomplicated: Secondary | ICD-10-CM | POA: Insufficient documentation

## 2017-05-12 DIAGNOSIS — M79651 Pain in right thigh: Secondary | ICD-10-CM | POA: Diagnosis not present

## 2017-05-12 DIAGNOSIS — Y92410 Unspecified street and highway as the place of occurrence of the external cause: Secondary | ICD-10-CM | POA: Diagnosis not present

## 2017-05-12 DIAGNOSIS — M25512 Pain in left shoulder: Secondary | ICD-10-CM | POA: Diagnosis not present

## 2017-05-12 DIAGNOSIS — L299 Pruritus, unspecified: Secondary | ICD-10-CM | POA: Diagnosis not present

## 2017-05-12 DIAGNOSIS — I10 Essential (primary) hypertension: Secondary | ICD-10-CM | POA: Diagnosis not present

## 2017-05-12 DIAGNOSIS — J4520 Mild intermittent asthma, uncomplicated: Secondary | ICD-10-CM | POA: Diagnosis not present

## 2017-05-12 DIAGNOSIS — R079 Chest pain, unspecified: Secondary | ICD-10-CM | POA: Diagnosis not present

## 2017-05-12 DIAGNOSIS — Y999 Unspecified external cause status: Secondary | ICD-10-CM | POA: Diagnosis not present

## 2017-05-12 DIAGNOSIS — Y9389 Activity, other specified: Secondary | ICD-10-CM | POA: Insufficient documentation

## 2017-05-12 DIAGNOSIS — Z79899 Other long term (current) drug therapy: Secondary | ICD-10-CM | POA: Insufficient documentation

## 2017-05-12 DIAGNOSIS — T40605A Adverse effect of unspecified narcotics, initial encounter: Secondary | ICD-10-CM | POA: Diagnosis not present

## 2017-05-12 DIAGNOSIS — R0789 Other chest pain: Secondary | ICD-10-CM | POA: Insufficient documentation

## 2017-05-12 DIAGNOSIS — M25519 Pain in unspecified shoulder: Secondary | ICD-10-CM

## 2017-05-12 LAB — POC URINE PREG, ED: Preg Test, Ur: NEGATIVE

## 2017-05-12 MED ORDER — CYCLOBENZAPRINE HCL 10 MG PO TABS
10.0000 mg | ORAL_TABLET | Freq: Once | ORAL | Status: AC
  Administered 2017-05-12: 10 mg via ORAL
  Filled 2017-05-12: qty 1

## 2017-05-12 MED ORDER — NAPROXEN 500 MG PO TABS: 500.0000 mg | ORAL_TABLET | Freq: Two times a day (BID) | ORAL | 0 refills | Status: DC

## 2017-05-12 MED ORDER — DIPHENHYDRAMINE HCL 25 MG PO CAPS
50.0000 mg | ORAL_CAPSULE | Freq: Once | ORAL | Status: AC
  Administered 2017-05-12: 50 mg via ORAL
  Filled 2017-05-12: qty 2

## 2017-05-12 MED ORDER — HYDROCODONE-ACETAMINOPHEN 5-325 MG PO TABS
1.0000 | ORAL_TABLET | Freq: Once | ORAL | Status: AC
  Administered 2017-05-12: 1 via ORAL
  Filled 2017-05-12: qty 1

## 2017-05-12 MED ORDER — CYCLOBENZAPRINE HCL 10 MG PO TABS: 10.0000 mg | ORAL_TABLET | Freq: Every evening | ORAL | 0 refills | Status: DC | PRN

## 2017-05-12 NOTE — ED Notes (Signed)
PA wants pt monitored pt for 30 minutes.

## 2017-05-12 NOTE — ED Notes (Signed)
ED Provider at bedside. 

## 2017-05-12 NOTE — ED Notes (Signed)
Pt let this RN know that her itching has subsided and was ready to go now. Reviewed d/c instructions and answered pt questions. Pt departed in NAD, refused use of wheelchair.

## 2017-05-12 NOTE — ED Notes (Signed)
Patient returned from XRay
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
## 2017-05-12 NOTE — Discharge Instructions (Signed)
You have been given a prescription for naproxen also known as Aleve.  You may obtain the Aleve over-the-counter if you desire.  Please do not take any nonsteroidal anti-inflammatories while taking Aleve.  Do not take ibuprofen, Motrin.  Please take Tylenol (acetaminophen) to relieve your pain.  You may take tylenol, up to 1,000 mg (two extra strength pills).  Do not take more than 3,000 mg tylenol in a 24 hour period.  Please check all medication labels as many medications such as pain and cold medications may contain tylenol. Please do not drink alcohol while taking this medication.    Today you received medications that may make you sleepy or impair your ability to make decisions.  For the next 24 hours please do not drive, operate heavy machinery, care for a small child with out another adult present, or perform any activities that may cause harm to you or someone else if you were to fall asleep or be impaired.   You are being prescribed a medication which may make you sleepy. Please follow up of listed precautions for at least 24 hours after taking one dose.

## 2017-05-12 NOTE — ED Notes (Signed)
Pt reports has not taken BP med today. HCTZ.
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
## 2017-05-12 NOTE — ED Notes (Signed)
Gave Benadryl for widespread itching from Hydrocodone.  No rash.

## 2017-07-27 ENCOUNTER — Telehealth: Payer: Self-pay | Admitting: Internal Medicine

## 2017-07-27 NOTE — Telephone Encounter (Signed)
Attempted to call pt on both phone numbers, no answer/voicemail set up. Terri [name] Kennon Holter, CMA

## 2017-07-27 NOTE — Telephone Encounter (Signed)
Need for reasonable accomation form dropped off for at front desk for completion.  Verified that patient section of form has been completed.  Last DOS/WCC with PCP was 04/17/17.  Placed form in team folder to be completed by clinical staff.  Patient needs today if at all possible.  Crista Luria

## 2017-07-27 NOTE — Telephone Encounter (Signed)
Signed form. Tried to call patient because she had indicated "will pick form up" on cover sheet but fax number also provided--voicemail on number listed not set up. Will place a copy in fax pile and form upfront.   Olene Floss, MD [place], PGY-3

## 2017-07-31 NOTE — Telephone Encounter (Signed)
Pt informed by April. Wannetta Langland Kennon Holter, CMA

## 2017-08-03 ENCOUNTER — Encounter: Payer: Medicaid Other | Admitting: Internal Medicine

## 2017-11-23 MED FILL — oxyCODONE HCL 30 MG TABS: 30 | 30 days supply | Qty: 120 | Fill #0

## 2018-01-03 ENCOUNTER — Other Ambulatory Visit: Payer: Self-pay

## 2018-01-03 ENCOUNTER — Other Ambulatory Visit: Payer: Self-pay | Admitting: Student

## 2018-01-03 ENCOUNTER — Encounter (HOSPITAL_COMMUNITY): Payer: Self-pay | Admitting: *Deleted

## 2018-01-03 ENCOUNTER — Inpatient Hospital Stay (HOSPITAL_COMMUNITY)
Admission: AD | Admit: 2018-01-03 | Discharge: 2018-01-03 | Disposition: A | Discharge status: Home / Self Care | Payer: Medicaid Other | Source: Ambulatory Visit | Attending: Obstetrics and Gynecology | Admitting: Obstetrics and Gynecology

## 2018-01-03 DIAGNOSIS — R8271 Bacteriuria: Secondary | ICD-10-CM | POA: Diagnosis not present

## 2018-01-03 DIAGNOSIS — Z79899 Other long term (current) drug therapy: Secondary | ICD-10-CM | POA: Diagnosis not present

## 2018-01-03 DIAGNOSIS — I10 Essential (primary) hypertension: Secondary | ICD-10-CM | POA: Diagnosis not present

## 2018-01-03 DIAGNOSIS — N6452 Nipple discharge: Secondary | ICD-10-CM

## 2018-01-03 DIAGNOSIS — Z885 Allergy status to narcotic agent status: Secondary | ICD-10-CM | POA: Insufficient documentation

## 2018-01-03 DIAGNOSIS — Z3202 Encounter for pregnancy test, result negative: Secondary | ICD-10-CM | POA: Insufficient documentation

## 2018-01-03 DIAGNOSIS — F17210 Nicotine dependence, cigarettes, uncomplicated: Secondary | ICD-10-CM | POA: Insufficient documentation

## 2018-01-03 DIAGNOSIS — B9689 Other specified bacterial agents as the cause of diseases classified elsewhere: Secondary | ICD-10-CM | POA: Diagnosis not present

## 2018-01-03 DIAGNOSIS — N760 Acute vaginitis: Secondary | ICD-10-CM | POA: Diagnosis not present

## 2018-01-03 DIAGNOSIS — N898 Other specified noninflammatory disorders of vagina: Secondary | ICD-10-CM | POA: Diagnosis present

## 2018-01-03 LAB — URINALYSIS, ROUTINE W REFLEX MICROSCOPIC
BILIRUBIN URINE: NEGATIVE
Glucose, UA: NEGATIVE mg/dL
HGB URINE DIPSTICK: NEGATIVE
Ketones, ur: NEGATIVE mg/dL
NITRITE: POSITIVE — AB
PH: 5 (ref 5.0–8.0)
Protein, ur: 100 mg/dL — AB
SPECIFIC GRAVITY, URINE: 1.027 (ref 1.005–1.030)
WBC, UA: >50 WBC/hpf — ABNORMAL HIGH (ref 0–5)

## 2018-01-03 LAB — POCT PREGNANCY, URINE: Preg Test, Ur: NEGATIVE

## 2018-01-03 LAB — WET PREP, GENITAL
SPERM: NONE SEEN
Trich, Wet Prep: NONE SEEN
Yeast Wet Prep HPF POC: NONE SEEN

## 2018-01-03 MED ORDER — METRONIDAZOLE 500 MG PO TABS: 500.0000 mg | ORAL_TABLET | Freq: Two times a day (BID) | ORAL | 0 refills | Status: DC

## 2018-01-03 NOTE — MAU Note (Signed)
Pt C/O white discharge with odor this week, noted pus & blood draining from L nipple when she squeezed it in the shower this morning.  Also lower abd pain this week.

## 2018-01-03 NOTE — MAU Provider Note (Signed)
Chief Complaint: Vaginal Discharge; Breast Discharge; and Abdominal Pain   First Provider Initiated Contact with Patient 01/03/18 1312     SUBJECTIVE HPI: Terri [name] is a 32 y.o. G1P1001 at Unknown who presents to [facility] Admissions reporting vaginal & nipple discharge.  Reports vaginal discharge x 1 week. Discharge is thin & white with foul odor. No itching or irritation. She has been with 1 partner for the last few months. Does not use condoms. Denies dyspareunia or postcoital bleeding.  Noticed bloody discharge for left nipple this morning. Discharge only seen with expression. Denies breast pain. No recent trauma to breast. No fever/chills. No breast feeding. No change in medications. Denies hx of breast masses. Has Mat aunt with breast cancer.  Denies abdominal pain, dysuria, vaginal bleeding, urinary frequency, or hematuria.    Past Medical History:  Diagnosis Date  . Asthma   . Hypertension   . Hypertention, malignant, with acute intensive management    OB History  Gravida Para Term Preterm AB Living  1 1 1     1   SAB TAB Ectopic Multiple Live Births          1    # Outcome Date GA Lbr Len/2nd Weight Sex Delivery Anes PTL Lv  1 Term 02/17/05    M Vag-Spont EPI N LIV   History reviewed. No pertinent surgical history. Social History   Socioeconomic History  . Marital status: Single    Spouse name: Not on file  . Number of children: Not on file  . Years of education: Not on file  . Highest education level: Not on file  Occupational History  . Not on file  Social Needs  . Financial resource strain: Not on file  . Food insecurity:    Worry: Not on file    Inability: Not on file  . Transportation needs:    Medical: Not on file    Non-medical: Not on file  Tobacco Use  . Smoking status: Current Every Day Smoker    Packs/day: 0.20    Types: Cigarettes  . Smokeless tobacco: Never Used  Substance and Sexual Activity  . Alcohol use: Yes    Comment: daily   . Drug  use: Yes    Types: Marijuana    Comment: everyday   . Sexual activity: Yes    Birth control/protection: None, Condom  Lifestyle  . Physical activity:    Days per week: Not on file    Minutes per session: Not on file  . Stress: Not on file  Relationships  . Social connections:    Talks on phone: Not on file    Gets together: Not on file    Attends religious service: Not on file    Active member of club or organization: Not on file    Attends meetings of clubs or organizations: Not on file    Relationship status: Not on file  . Intimate partner violence:    Fear of current or ex partner: Not on file    Emotionally abused: Not on file    Physically abused: Not on file    Forced sexual activity: Not on file  Other Topics Concern  . Not on file  Social History Narrative   ** Merged History Encounter **       History reviewed. No pertinent family history. No current facility-administered medications on file prior to encounter.    Current Outpatient Medications on File Prior to Encounter  Medication Sig Dispense Refill  .  hydrochlorothiazide (HYDRODIURIL) 25 MG tablet Take 1 tablet (25 mg total) by mouth daily. 90 tablet 3   Allergies  Allergen Reactions  . Vicodin [Hydrocodone-Acetaminophen] Itching    I have reviewed patient's Past Medical Hx, Surgical Hx, Family Hx, Social Hx, medications and allergies.   Review of Systems  Constitutional: Negative.   Gastrointestinal: Negative.   Genitourinary: Positive for vaginal discharge. Negative for dyspareunia, dysuria, frequency, genital sores, hematuria, vaginal bleeding and vaginal pain.    OBJECTIVE Patient Vitals for the past 24 hrs:  BP Temp Temp src Pulse Resp Height Weight  01/03/18 1400 (!) 156/108 - - 75 18 - -  01/03/18 1236 (!) 143/109 98.3 F (36.8 C) Oral 85 16 5\' 8"  (1.727 m) 232 lb 1.9 oz (105.3 kg)   Constitutional: Well-developed, well-nourished female in no acute distress.  Cardiovascular: normal rate &  rhythm, no murmur Respiratory: normal rate and effort. Lung sounds clear throughout Breasts: breast exam performed. No skin changes or lesions. Equal in size. No masses  palpated. Minimal amount of bright red blood expressed from left nipple GI: Abd soft, non-tender, Pos BS x 4. No guarding or rebound tenderness MS: Extremities nontender, no edema, normal ROM Neurologic: Alert and oriented x 4.  GU:   SPECULUM EXAM: NEFG, small amount of thin grey frothy discharge. No cervical  lesions. No blood.   BIMANUAL: cervix closed; uterus normal size, no adnexal tenderness or masses.  No CMT.  LAB RESULTS Results for orders placed or performed during the hospital encounter of 01/03/18 (from the past 24 hour(s))  Urinalysis, Routine w reflex microscopic     Status: Abnormal   Collection Time: 01/03/18 12:53 PM  Result Value Ref Range   Color, Urine AMBER (A) YELLOW   APPearance CLOUDY (A) CLEAR   Specific Gravity, Urine 1.027 1.005 - 1.030   pH 5.0 5.0 - 8.0   Glucose, UA NEGATIVE NEGATIVE mg/dL   Hgb urine dipstick NEGATIVE NEGATIVE   Bilirubin Urine NEGATIVE NEGATIVE   Ketones, ur NEGATIVE NEGATIVE mg/dL   Protein, ur 100 (A) NEGATIVE mg/dL   Nitrite POSITIVE (A) NEGATIVE   Leukocytes, UA LARGE (A) NEGATIVE   RBC / HPF 21-50 0 - 5 RBC/hpf   WBC, UA >50 (H) 0 - 5 WBC/hpf   Bacteria, UA FEW (A) NONE SEEN   Squamous Epithelial / LPF 0-5 0 - 5   WBC Clumps PRESENT    Mucus PRESENT   Pregnancy, urine POC     Status: None   Collection Time: 01/03/18 12:55 PM  Result Value Ref Range   Preg Test, Ur NEGATIVE NEGATIVE  Wet prep, genital     Status: Abnormal   Collection Time: 01/03/18  1:29 PM  Result Value Ref Range   Yeast Wet Prep HPF POC NONE SEEN NONE SEEN   Trich, Wet Prep NONE SEEN NONE SEEN   Clue Cells Wet Prep HPF POC PRESENT (A) NONE SEEN   WBC, Wet Prep HPF POC MODERATE (A) NONE SEEN   Sperm NONE SEEN     IMAGING No results found.  MAU COURSE Orders Placed This Encounter   Procedures  . Wet prep, genital  . Urinalysis, Routine w reflex microscopic  . Pregnancy, urine POC  . Discharge patient   Meds ordered this encounter  Medications  . metroNIDAZOLE (FLAGYL) 500 MG tablet    Sig: Take 1 tablet (500 mg total) by mouth 2 (two) times daily.    Dispense:  14 tablet    Refill:  0    Order Specific Question:   Supervising Provider    Answer:   Aletha Halim [[id number]]    MDM -UPT negative -Elevated BP. Pt has hypertension. Goes to [facility] for primary care. Taking HCTZ but hasn't taken this week d/t family stress (FOB died unexpectedly). Denies h/a, CP, or SOB.  -Breast exam performed. Will send to breast center for imaging -GC/CT & wet prep collected, will tx for BV based on exam & clue cells -U/a + nitrites. Pt asymptomatic, afebrile, and not pregnant. Will defer tx at this time. Pt to call PCP if develops symptoms. Encouraged increase water intake   ASSESSMENT 1. BV (bacterial vaginosis)   2. Asymptomatic bacteriuria   3. Pregnancy examination or test, negative result   4. Bloody discharge from left nipple     PLAN Discharge home in stable condition. No alcohol with flagyl GC/CT pending Mammogram & breast ultrasound orders placed in orders only encounter -- pt to call for appt  Follow-up Information    [facility] Follow up.   Specialty:  Diagnostic Radiology Why:  Call office to schedule imaging appointment Contact information: [place] [address] [phone]        [place] Bing, DO Follow up.   Specialty:  Family Medicine Contact information: [place] [address] [phone]          Allergies as of 01/03/2018      Reactions   Vicodin [hydrocodone-acetaminophen] Itching      Medication List    STOP taking these medications   cyclobenzaprine 10 MG tablet Commonly known as:  FLEXERIL   naproxen 500 MG tablet Commonly known as:   NAPROSYN   SUMAtriptan 50 MG tablet Commonly known as:  IMITREX     TAKE these medications   hydrochlorothiazide 25 MG tablet Commonly known as:  HYDRODIURIL Take 1 tablet (25 mg total) by mouth daily.   metroNIDAZOLE 500 MG tablet Commonly known as:  FLAGYL Take 1 tablet (500 mg total) by mouth 2 (two) times daily.        Jorje Guild, NP 01/03/2018  2:33 PM

## 2018-01-03 NOTE — Discharge Instructions (Signed)
Bacterial Vaginosis Bacterial vaginosis is a vaginal infection that occurs when the normal balance of bacteria in the vagina is disrupted. It results from an overgrowth of certain bacteria. This is the most common vaginal infection among women ages 15-44. Because bacterial vaginosis increases your risk for STIs (sexually transmitted infections), getting treated can help reduce your risk for chlamydia, gonorrhea, herpes, and HIV (human immunodeficiency virus). Treatment is also important for preventing complications in pregnant women, because this condition can cause an early (premature) delivery. What are the causes? This condition is caused by an increase in harmful bacteria that are normally present in small amounts in the vagina. However, the reason that the condition develops is not fully understood. What increases the risk? The following factors may make you more likely to develop this condition:  Having a new sexual partner or multiple sexual partners.  Having unprotected sex.  Douching.  Having an intrauterine device (IUD).  Smoking.  Drug and alcohol abuse.  Taking certain antibiotic medicines.  Being pregnant.  You cannot get bacterial vaginosis from toilet seats, bedding, swimming pools, or contact with objects around you. What are the signs or symptoms? Symptoms of this condition include:  Grey or white vaginal discharge. The discharge can also be watery or foamy.  A fish-like odor with discharge, especially after sexual intercourse or during menstruation.  Itching in and around the vagina.  Burning or pain with urination.  Some women with bacterial vaginosis have no signs or symptoms. How is this diagnosed? This condition is diagnosed based on:  Your medical history.  A physical exam of the vagina.  Testing a sample of vaginal fluid under a microscope to look for a large amount of bad bacteria or abnormal cells. Your health care provider may use a cotton swab  or a small wooden spatula to collect the sample.  How is this treated? This condition is treated with antibiotics. These may be given as a pill, a vaginal cream, or a medicine that is put into the vagina (suppository). If the condition comes back after treatment, a second round of antibiotics may be needed. Follow these instructions at home: Medicines  Take over-the-counter and prescription medicines only as told by your health care provider.  Take or use your antibiotic as told by your health care provider. Do not stop taking or using the antibiotic even if you start to feel better. General instructions  If you have a female sexual partner, tell her that you have a vaginal infection. She should see her health care provider and be treated if she has symptoms. If you have a female sexual partner, he does not need treatment.  During treatment: ? Avoid sexual activity until you finish treatment. ? Do not douche. ? Avoid alcohol as directed by your health care provider. ? Avoid breastfeeding as directed by your health care provider.  Drink enough water and fluids to keep your urine clear or pale yellow.  Keep the area around your vagina and rectum clean. ? Wash the area daily with warm water. ? Wipe yourself from front to back after using the toilet.  Keep all follow-up visits as told by your health care provider. This is important. How is this prevented?  Do not douche.  Wash the outside of your vagina with warm water only.  Use protection when having sex. This includes latex condoms and dental dams.  Limit how many sexual partners you have. To help prevent bacterial vaginosis, it is best to have sex with just   one partner (monogamous).  Make sure you and your sexual partner are tested for STIs.  Wear cotton or cotton-lined underwear.  Avoid wearing tight pants and pantyhose, especially during summer.  Limit the amount of alcohol that you drink.  Do not use any products that  contain nicotine or tobacco, such as cigarettes and e-cigarettes. If you need help quitting, ask your health care provider.  Do not use illegal drugs. Where to find more information:  Centers for Disease Control and Prevention: www.cdc.gov/std  American Sexual Health Association (ASHA): www.ashastd.org  U.S. Department of Health and Human Services, Office on Women's Health: www.womenshealth.gov/ or https://www.womenshealth.gov/a-z-topics/bacterial-vaginosis Contact a health care provider if:  Your symptoms do not improve, even after treatment.  You have more discharge or pain when urinating.  You have a fever.  You have pain in your abdomen.  You have pain during sex.  You have vaginal bleeding between periods. Summary  Bacterial vaginosis is a vaginal infection that occurs when the normal balance of bacteria in the vagina is disrupted.  Because bacterial vaginosis increases your risk for STIs (sexually transmitted infections), getting treated can help reduce your risk for chlamydia, gonorrhea, herpes, and HIV (human immunodeficiency virus). Treatment is also important for preventing complications in pregnant women, because the condition can cause an early (premature) delivery.  This condition is treated with antibiotic medicines. These may be given as a pill, a vaginal cream, or a medicine that is put into the vagina (suppository). This information is not intended to replace advice given to you by your health care provider. Make sure you discuss any questions you have with your health care provider. Document Released: 05/29/2005 Document Revised: 10/02/2016 Document Reviewed: 02/12/2016 Elsevier Interactive Patient Education  2018 Elsevier Inc.  

## 2018-01-03 NOTE — MAU Note (Signed)
Pt. Reports she suffers from chronic hypertension, but has not taken her medication this month.  Pt. States the father of her 32yo son died on January 16, 2018 and she hasn't taken the medication due to alcohol consumption

## 2018-01-04 LAB — GC/CHLAMYDIA PROBE AMP (~~LOC~~) NOT AT ARMC
CHLAMYDIA, DNA PROBE: NEGATIVE
Neisseria Gonorrhea: NEGATIVE

## 2018-04-29 ENCOUNTER — Telehealth: Payer: Self-pay | Admitting: Family Medicine

## 2018-04-29 NOTE — Telephone Encounter (Signed)
This patient has had 8 no shows at [facility].  She needs to be dismissed just for this reason alone.  She does not follow protocol on forms.  She came in with her son today insisting that his sports physical be filled out immediately.  She was rude and demanding.  Please check in to this.  Thank you.

## 2018-05-01 NOTE — Telephone Encounter (Signed)
Dr. Gwendlyn Deutscher, can we send patient a dismissal letter.  Harriet [place], [place], PGY-3

## 2018-05-01 NOTE — Telephone Encounter (Signed)
Hello Shanon Brow,  Thank you for bringing this to my notice. We still have to follow the protocol of letting patient know about the policy before we can proceed with dismissal. I don't see any documented note from PCP on file that they have had "No-Show" conversation with the patient with a follow-up certified letter mailed to the patient. It is after then if she "No-show" again we can dismiss her. Please let me know when she has been contacted and mailed a letter. I will f/u on it. Thanks.

## 2018-06-28 ENCOUNTER — Encounter (HOSPITAL_COMMUNITY): Payer: Self-pay | Admitting: *Deleted

## 2018-06-28 ENCOUNTER — Other Ambulatory Visit: Payer: Self-pay

## 2018-06-28 ENCOUNTER — Inpatient Hospital Stay (HOSPITAL_COMMUNITY)
Admission: AD | Admit: 2018-06-28 | Discharge: 2018-06-28 | Disposition: A | Discharge status: Home / Self Care | Payer: Medicaid Other | Attending: Obstetrics & Gynecology | Admitting: Obstetrics & Gynecology

## 2018-06-28 DIAGNOSIS — R1030 Lower abdominal pain, unspecified: Secondary | ICD-10-CM | POA: Insufficient documentation

## 2018-06-28 DIAGNOSIS — Z3202 Encounter for pregnancy test, result negative: Secondary | ICD-10-CM | POA: Diagnosis not present

## 2018-06-28 DIAGNOSIS — N3090 Cystitis, unspecified without hematuria: Secondary | ICD-10-CM | POA: Insufficient documentation

## 2018-06-28 DIAGNOSIS — I10 Essential (primary) hypertension: Secondary | ICD-10-CM | POA: Diagnosis not present

## 2018-06-28 DIAGNOSIS — F17210 Nicotine dependence, cigarettes, uncomplicated: Secondary | ICD-10-CM | POA: Diagnosis not present

## 2018-06-28 LAB — CBC
HCT: 42.5 % (ref 36.0–46.0)
Hemoglobin: 13.9 g/dL (ref 12.0–15.0)
MCH: 30.9 pg (ref 26.0–34.0)
MCHC: 32.7 g/dL (ref 30.0–36.0)
MCV: 94.4 fL (ref 80.0–100.0)
Platelets: 301 10*3/uL (ref 150–400)
RBC: 4.5 MIL/uL (ref 3.87–5.11)
RDW: 12.9 % (ref 11.5–15.5)
WBC: 5.2 10*3/uL (ref 4.0–10.5)
nRBC: 0 % (ref 0.0–0.2)

## 2018-06-28 LAB — URINALYSIS, ROUTINE W REFLEX MICROSCOPIC
Bilirubin Urine: NEGATIVE
Glucose, UA: NEGATIVE mg/dL
Hgb urine dipstick: NEGATIVE
Ketones, ur: NEGATIVE mg/dL
Nitrite: POSITIVE — AB
Protein, ur: 30 mg/dL — AB
Specific Gravity, Urine: 1.017 (ref 1.005–1.030)
WBC, UA: >50 WBC/hpf — ABNORMAL HIGH (ref 0–5)
pH: 6 (ref 5.0–8.0)

## 2018-06-28 LAB — POCT PREGNANCY, URINE: Preg Test, Ur: NEGATIVE

## 2018-06-28 LAB — WET PREP, GENITAL
Clue Cells Wet Prep HPF POC: NONE SEEN
Sperm: NONE SEEN
Trich, Wet Prep: NONE SEEN
WBC, Wet Prep HPF POC: NONE SEEN
Yeast Wet Prep HPF POC: NONE SEEN

## 2018-06-28 LAB — ABO/RH: ABO/RH(D): O POS

## 2018-06-28 LAB — HCG, QUANTITATIVE, PREGNANCY

## 2018-06-28 MED ORDER — NITROFURANTOIN MONOHYD MACRO 100 MG PO CAPS: 100.0000 mg | ORAL_CAPSULE | Freq: Two times a day (BID) | ORAL | 0 refills | Status: DC

## 2018-06-28 MED ORDER — HYDROCHLOROTHIAZIDE 25 MG PO TABS: 25.0000 mg | ORAL_TABLET | Freq: Every day | ORAL | 3 refills | Status: DC

## 2018-06-28 MED ORDER — PHENAZOPYRIDINE HCL 200 MG PO TABS: 200.0000 mg | ORAL_TABLET | Freq: Three times a day (TID) | ORAL | 0 refills | Status: AC

## 2018-06-28 NOTE — MAU Provider Note (Signed)
History     CSN: [id number]  Arrival date and time: 06/28/18 1034   First Provider Initiated Contact with Patient 06/28/18 1408      Chief Complaint  Patient presents with  . Abdominal Pain  . Vaginal Bleeding  . Emesis  . Possible Pregnancy   HPI Terri [name] is a 33 y.o. G1P1001 non pregnant female who presents with lower abdominal pain. She states it has been ongoing for a week. She also reports irregular periods so she took 2 pregnancy tests, one was positive and one was negative. She reports intermittent nausea and loose stools after meals. She denies any abnormal vaginal discharge or bleeding.   OB History    Gravida  1   Para  1   Term  1   Preterm      AB      Living  1     SAB      TAB      Ectopic      Multiple      Live Births  1           Past Medical History:  Diagnosis Date  . Asthma   . Hypertension   . Hypertention, malignant, with acute intensive management     History reviewed. No pertinent surgical history.  History reviewed. No pertinent family history.  Social History   Tobacco Use  . Smoking status: Current Every Day Smoker    Packs/day: 0.20    Types: Cigarettes  . Smokeless tobacco: Never Used  Substance Use Topics  . Alcohol use: Yes    Comment: daily   . Drug use: Yes    Types: Marijuana    Comment: everyday     Allergies:  Allergies  Allergen Reactions  . Vicodin [Hydrocodone-Acetaminophen] Itching    Medications Prior to Admission  Medication Sig Dispense Refill Last Dose  . hydrochlorothiazide (HYDRODIURIL) 25 MG tablet Take 1 tablet (25 mg total) by mouth daily. 90 tablet 3   . metroNIDAZOLE (FLAGYL) 500 MG tablet Take 1 tablet (500 mg total) by mouth 2 (two) times daily. 14 tablet 0     Review of Systems  Constitutional: Negative.  Negative for fatigue and fever.  HENT: Negative.   Respiratory: Negative.  Negative for shortness of breath.   Cardiovascular: Negative.  Negative for chest pain.   Gastrointestinal: Positive for abdominal pain and nausea. Negative for constipation, diarrhea and vomiting.  Genitourinary: Negative.  Negative for dysuria, vaginal bleeding and vaginal discharge.  Neurological: Negative.  Negative for dizziness and headaches.   Physical Exam   Blood pressure (!) 166/115, pulse 80, temperature 98.9 F (37.2 C), temperature source Oral, resp. rate 18, height 5\' 8"  (1.727 m), weight 112.6 kg, last menstrual period 06/03/2018, SpO2 100 %.  Physical Exam  Nursing note and vitals reviewed. Constitutional: She is oriented to person, place, and time. She appears well-developed and well-nourished. No distress.  HENT:  Head: Normocephalic.  Eyes: Pupils are equal, round, and reactive to light.  Cardiovascular: Normal rate, regular rhythm and normal heart sounds.  Respiratory: Effort normal and breath sounds normal. No respiratory distress.  GI: Soft. Bowel sounds are normal. She exhibits no distension. There is no abdominal tenderness. There is no rebound and no guarding.  Genitourinary:    No vaginal discharge.   Neurological: She is alert and oriented to person, place, and time.  Skin: Skin is warm and dry.  Psychiatric: She has a normal mood and affect. Her behavior is  normal. Judgment and thought content normal.    MAU Course  Procedures Results for orders placed or performed during the hospital encounter of 06/28/18 (from the past 24 hour(s))  Urinalysis, Routine w reflex microscopic     Status: Abnormal   Collection Time: 06/28/18 11:37 AM  Result Value Ref Range   Color, Urine YELLOW YELLOW   APPearance HAZY (A) CLEAR   Specific Gravity, Urine 1.017 1.005 - 1.030   pH 6.0 5.0 - 8.0   Glucose, UA NEGATIVE NEGATIVE mg/dL   Hgb urine dipstick NEGATIVE NEGATIVE   Bilirubin Urine NEGATIVE NEGATIVE   Ketones, ur NEGATIVE NEGATIVE mg/dL   Protein, ur 30 (A) NEGATIVE mg/dL   Nitrite POSITIVE (A) NEGATIVE   Leukocytes, UA MODERATE (A) NEGATIVE   RBC  / HPF 11-20 0 - 5 RBC/hpf   WBC, UA >50 (H) 0 - 5 WBC/hpf   Bacteria, UA MANY (A) NONE SEEN   Squamous Epithelial / LPF 0-5 0 - 5   Mucus PRESENT   Pregnancy, urine POC     Status: None   Collection Time: 06/28/18 11:44 AM  Result Value Ref Range   Preg Test, Ur NEGATIVE NEGATIVE  CBC     Status: None   Collection Time: 06/28/18 12:36 PM  Result Value Ref Range   WBC 5.2 4.0 - 10.5 K/uL   RBC 4.50 3.87 - 5.11 MIL/uL   Hemoglobin 13.9 12.0 - 15.0 g/dL   HCT 42.5 36.0 - 46.0 %   MCV 94.4 80.0 - 100.0 fL   MCH 30.9 26.0 - 34.0 pg   MCHC 32.7 30.0 - 36.0 g/dL   RDW 12.9 11.5 - 15.5 %   Platelets 301 150 - 400 K/uL   nRBC 0.0 0.0 - 0.2 %  hCG, quantitative, pregnancy     Status: None   Collection Time: 06/28/18 12:36 PM  Result Value Ref Range   hCG, Beta Chain, Quant, S <1 <5 mIU/mL  ABO/Rh     Status: None   Collection Time: 06/28/18 12:36 PM  Result Value Ref Range   ABO/RH(D)      O POS Performed at [facility], [address] [place], [place] [address]   Wet prep, genital     Status: None   Collection Time: 06/28/18  2:13 PM  Result Value Ref Range   Yeast Wet Prep HPF POC NONE SEEN NONE SEEN   Trich, Wet Prep NONE SEEN NONE SEEN   Clue Cells Wet Prep HPF POC NONE SEEN NONE SEEN   WBC, Wet Prep HPF POC NONE SEEN NONE SEEN   Sperm NONE SEEN    MDM UA, UPT, UC CBC, HCG, ABO Wet prep- positive clue cells but no symptoms. Will wait to treat  Patient has known hypertension. She states she hasn't been taking her HCTZ since November. Denies any shortness of breath, chest pain or HA. Lengthy discussion with patient of importance of taking BP medication and establishing care with a new PCP.  Assessment and Plan   1. Cystitis   2. Essential hypertension   3. HYPERTENSION, BENIGN ESSENTIAL    -Discharge home in stable condition -Rx for macrobid and pyridium given to patient  -HTN precautions discussed -Patient advised to follow-up with PCP for ongoing  management -Patient may return to MAU as needed or if her condition were to change or worsen  Wende Mott CNM 06/28/2018, 2:08 PM

## 2018-06-28 NOTE — MAU Note (Signed)
Had some bleeding in her urine, having some really bad, sharp pains in lower stomach and low back,  Esp on rt side.  Been throwing up , doesn't have an appetite. Did 2 HPT, 1st test 2 wks ago was neg, one on Monday was +. Loose- mushy stools every time she eats for past 2 wks

## 2018-06-28 NOTE — Discharge Instructions (Signed)
In late 2019, the [facility] will be moving to the [place]. At that time, the MAU (Maternity Admissions Unit), where you are being seen today, will no longer take care of non-pregnant patients. We strongly encourage you to find a doctor's office before that time, so that you can be seen with any GYN concerns, like vaginal discharge, urinary tract infection, etc.. in a timely manner.  In order to make an office visit more convenient, the [facility] for [place] at [facility] will be offering evening hours with same-day appointments, walk-in appointments and scheduled appointments available during this time.  Center for [facility] Hours: Monday - 8am - 7:30 pm with walk-in between 4pm- 7:30 pm Tuesday - 8 am - 5 pm (starting 09/11/17 we will be open late and accepting walk-ins from 4pm - 7:30pm) Wednesday - 8 am - 5 pm (starting 12/12/17 we will be open late and accepting walk-ins from 4pm - 7:30pm) Thursday 8 am - 5 pm (starting 03/14/18 we will be open late and accepting walk-ins from 4pm - 7:30pm) Friday 8 am - 5 pm  For an appointment please call the Center for [place] @ [facility] at [phone]  For urgent needs, [facility] is also available for management of urgent GYN complaints such as vaginal discharge or urinary tract infections.

## 2018-07-01 LAB — GC/CHLAMYDIA PROBE AMP (~~LOC~~) NOT AT ARMC
CHLAMYDIA, DNA PROBE: NEGATIVE
NEISSERIA GONORRHEA: NEGATIVE

## 2018-10-14 ENCOUNTER — Ambulatory Visit
Admission: EM | Admit: 2018-10-14 | Discharge: 2018-10-14 | Disposition: A | Discharge status: Home / Self Care | Payer: Medicaid Other | Attending: Physician Assistant | Admitting: Physician Assistant

## 2018-10-14 DIAGNOSIS — Z041 Encounter for examination and observation following transport accident: Secondary | ICD-10-CM

## 2018-10-14 DIAGNOSIS — I10 Essential (primary) hypertension: Secondary | ICD-10-CM

## 2018-10-14 MED ORDER — METHOCARBAMOL 500 MG PO TABS: 500.0000 mg | ORAL_TABLET | Freq: Four times a day (QID) | ORAL | 0 refills | Status: DC

## 2018-10-14 MED ORDER — HYDROCHLOROTHIAZIDE 25 MG PO TABS: 25.0000 mg | ORAL_TABLET | Freq: Every day | ORAL | 3 refills | Status: DC

## 2018-10-14 NOTE — Discharge Instructions (Addendum)
Take your blood pressure medications.  See your Physician for recheck
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
## 2018-11-25 ENCOUNTER — Emergency Department (HOSPITAL_COMMUNITY): Payer: No Typology Code available for payment source

## 2018-11-25 ENCOUNTER — Encounter (HOSPITAL_COMMUNITY): Payer: Self-pay | Admitting: *Deleted

## 2018-11-25 ENCOUNTER — Other Ambulatory Visit: Payer: Self-pay

## 2018-11-25 ENCOUNTER — Emergency Department (HOSPITAL_COMMUNITY)
Admission: EM | Admit: 2018-11-25 | Discharge: 2018-11-25 | Disposition: A | Discharge status: Home / Self Care | Payer: No Typology Code available for payment source | Attending: Emergency Medicine | Admitting: Emergency Medicine

## 2018-11-25 DIAGNOSIS — J45909 Unspecified asthma, uncomplicated: Secondary | ICD-10-CM | POA: Insufficient documentation

## 2018-11-25 DIAGNOSIS — M25511 Pain in right shoulder: Secondary | ICD-10-CM | POA: Insufficient documentation

## 2018-11-25 DIAGNOSIS — R0781 Pleurodynia: Secondary | ICD-10-CM | POA: Diagnosis not present

## 2018-11-25 DIAGNOSIS — Z9114 Patient's other noncompliance with medication regimen: Secondary | ICD-10-CM | POA: Diagnosis not present

## 2018-11-25 DIAGNOSIS — M542 Cervicalgia: Secondary | ICD-10-CM | POA: Insufficient documentation

## 2018-11-25 DIAGNOSIS — R0789 Other chest pain: Secondary | ICD-10-CM | POA: Diagnosis not present

## 2018-11-25 DIAGNOSIS — Y92410 Unspecified street and highway as the place of occurrence of the external cause: Secondary | ICD-10-CM | POA: Diagnosis not present

## 2018-11-25 DIAGNOSIS — F17210 Nicotine dependence, cigarettes, uncomplicated: Secondary | ICD-10-CM | POA: Insufficient documentation

## 2018-11-25 DIAGNOSIS — Z79899 Other long term (current) drug therapy: Secondary | ICD-10-CM | POA: Insufficient documentation

## 2018-11-25 DIAGNOSIS — Y9389 Activity, other specified: Secondary | ICD-10-CM | POA: Insufficient documentation

## 2018-11-25 DIAGNOSIS — I10 Essential (primary) hypertension: Secondary | ICD-10-CM | POA: Diagnosis not present

## 2018-11-25 DIAGNOSIS — Y999 Unspecified external cause status: Secondary | ICD-10-CM | POA: Insufficient documentation

## 2018-11-25 DIAGNOSIS — R51 Headache: Secondary | ICD-10-CM | POA: Diagnosis not present

## 2018-11-25 DIAGNOSIS — R52 Pain, unspecified: Secondary | ICD-10-CM

## 2018-11-25 LAB — POC URINE PREG, ED: Preg Test, Ur: NEGATIVE

## 2018-11-25 MED ORDER — ACETAMINOPHEN 500 MG PO TABS
1000.0000 mg | ORAL_TABLET | Freq: Once | ORAL | Status: AC
  Administered 2018-11-25: 1000 mg via ORAL
  Filled 2018-11-25: qty 2

## 2018-11-25 MED ORDER — HYDROCHLOROTHIAZIDE 25 MG PO TABS
25.0000 mg | ORAL_TABLET | Freq: Once | ORAL | Status: AC
  Administered 2018-11-25: 21:00:00 25 mg via ORAL
  Filled 2018-11-25: qty 1

## 2018-11-25 MED ORDER — CYCLOBENZAPRINE HCL 10 MG PO TABS
10.0000 mg | ORAL_TABLET | Freq: Once | ORAL | Status: AC
  Administered 2018-11-25: 21:00:00 10 mg via ORAL
  Filled 2018-11-25: qty 1

## 2018-11-25 MED ORDER — CYCLOBENZAPRINE HCL 10 MG PO TABS: 10.0000 mg | ORAL_TABLET | Freq: Two times a day (BID) | ORAL | 0 refills | Status: DC | PRN
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
## 2018-11-25 NOTE — Discharge Instructions (Signed)
Alternate 600 mg of ibuprofen and [phone] mg of Tylenol every 3 hours as needed for pain. Do not exceed 4000 mg of Tylenol daily. You may take Flexeril up to twice daily as needed for muscle spasms. This medication may make you drowsy, so I typically only recommended at night. If this medication makes you drowsy throughout the day, no driving, drinking alcohol, or operating heavy machinery. You may also cut these tablets in half. Ice to areas of soreness for the next few days and then may move to heat. Do some gentle stretching throughout the day, especially during hot showers or baths. Take short frequent walks and avoid prolonged periods of sitting or laying. Expect to be sore for the next few day and follow up with primary care physician for recheck of ongoing symptoms but return to ER for emergent changing or worsening of symptoms such as severe headache that gets worse, altered mental status/behaving unusually, persistent vomiting, excessive drowsiness, numbness to the arms or legs, unsteady gait, or slurred speech.  If your blood pressure (BP) was elevated on multiple readings during this visit above 130 for the top number or above 80 for the bottom number, please have this repeated by your primary care provider within one month. You can also check your blood pressure when you are out at a pharmacy or grocery store. Many have machines that will check your blood pressure.  If your blood pressure remains elevated, please follow-up with your PCP.

## 2018-12-06 ENCOUNTER — Other Ambulatory Visit: Payer: Self-pay

## 2018-12-06 ENCOUNTER — Emergency Department (HOSPITAL_COMMUNITY)
Admission: EM | Admit: 2018-12-06 | Discharge: 2018-12-06 | Disposition: A | Discharge status: Home / Self Care | Payer: Medicaid Other | Attending: Emergency Medicine | Admitting: Emergency Medicine

## 2018-12-06 ENCOUNTER — Emergency Department (HOSPITAL_COMMUNITY): Payer: Medicaid Other

## 2018-12-06 ENCOUNTER — Encounter (HOSPITAL_COMMUNITY): Payer: Self-pay

## 2018-12-06 DIAGNOSIS — O9989 Other specified diseases and conditions complicating pregnancy, childbirth and the puerperium: Secondary | ICD-10-CM | POA: Insufficient documentation

## 2018-12-06 DIAGNOSIS — M546 Pain in thoracic spine: Secondary | ICD-10-CM | POA: Diagnosis not present

## 2018-12-06 DIAGNOSIS — X500XXA Overexertion from strenuous movement or load, initial encounter: Secondary | ICD-10-CM | POA: Insufficient documentation

## 2018-12-06 DIAGNOSIS — O10011 Pre-existing essential hypertension complicating pregnancy, first trimester: Secondary | ICD-10-CM | POA: Insufficient documentation

## 2018-12-06 DIAGNOSIS — J45909 Unspecified asthma, uncomplicated: Secondary | ICD-10-CM | POA: Insufficient documentation

## 2018-12-06 DIAGNOSIS — Z79899 Other long term (current) drug therapy: Secondary | ICD-10-CM | POA: Diagnosis not present

## 2018-12-06 DIAGNOSIS — Z3491 Encounter for supervision of normal pregnancy, unspecified, first trimester: Secondary | ICD-10-CM

## 2018-12-06 DIAGNOSIS — F17210 Nicotine dependence, cigarettes, uncomplicated: Secondary | ICD-10-CM | POA: Insufficient documentation

## 2018-12-06 DIAGNOSIS — O99331 Smoking (tobacco) complicating pregnancy, first trimester: Secondary | ICD-10-CM | POA: Diagnosis not present

## 2018-12-06 DIAGNOSIS — Z3A01 Less than 8 weeks gestation of pregnancy: Secondary | ICD-10-CM | POA: Diagnosis not present

## 2018-12-06 DIAGNOSIS — O10111 Pre-existing hypertensive heart disease complicating pregnancy, first trimester: Secondary | ICD-10-CM | POA: Diagnosis not present

## 2018-12-06 DIAGNOSIS — S299XXA Unspecified injury of thorax, initial encounter: Secondary | ICD-10-CM | POA: Diagnosis not present

## 2018-12-06 DIAGNOSIS — Y9389 Activity, other specified: Secondary | ICD-10-CM | POA: Insufficient documentation

## 2018-12-06 DIAGNOSIS — Y999 Unspecified external cause status: Secondary | ICD-10-CM | POA: Insufficient documentation

## 2018-12-06 DIAGNOSIS — Y92003 Bedroom of unspecified non-institutional (private) residence as the place of occurrence of the external cause: Secondary | ICD-10-CM | POA: Insufficient documentation

## 2018-12-06 DIAGNOSIS — R030 Elevated blood-pressure reading, without diagnosis of hypertension: Secondary | ICD-10-CM

## 2018-12-06 DIAGNOSIS — Z3A00 Weeks of gestation of pregnancy not specified: Secondary | ICD-10-CM | POA: Diagnosis not present

## 2018-12-06 DIAGNOSIS — Z3201 Encounter for pregnancy test, result positive: Secondary | ICD-10-CM | POA: Diagnosis not present

## 2018-12-06 DIAGNOSIS — O99511 Diseases of the respiratory system complicating pregnancy, first trimester: Secondary | ICD-10-CM | POA: Insufficient documentation

## 2018-12-06 LAB — URINALYSIS, ROUTINE W REFLEX MICROSCOPIC
Bilirubin Urine: NEGATIVE
Glucose, UA: NEGATIVE mg/dL
Hgb urine dipstick: NEGATIVE
Ketones, ur: 5 mg/dL — AB
Nitrite: POSITIVE — AB
Protein, ur: NEGATIVE mg/dL
Specific Gravity, Urine: 1.019 (ref 1.005–1.030)
WBC, UA: >50 WBC/hpf — ABNORMAL HIGH (ref 0–5)
pH: 6 (ref 5.0–8.0)

## 2018-12-06 LAB — I-STAT CREATININE, ED: Creatinine, Ser: 0.8 mg/dL (ref 0.44–1.00)

## 2018-12-06 LAB — POC URINE PREG, ED: Preg Test, Ur: POSITIVE — AB

## 2018-12-06 MED ORDER — METHOCARBAMOL 500 MG PO TABS
500.0000 mg | ORAL_TABLET | Freq: Once | ORAL | Status: AC
  Administered 2018-12-06: 500 mg via ORAL
  Filled 2018-12-06: qty 1

## 2018-12-06 MED ORDER — ACETAMINOPHEN 500 MG PO TABS
1000.0000 mg | ORAL_TABLET | Freq: Once | ORAL | Status: AC
  Administered 2018-12-06: 1000 mg via ORAL
  Filled 2018-12-06: qty 2

## 2018-12-06 MED ORDER — KETOROLAC TROMETHAMINE 30 MG/ML IJ SOLN
30.0000 mg | Freq: Once | INTRAMUSCULAR | Status: AC
  Administered 2018-12-06: 30 mg via INTRAMUSCULAR
  Filled 2018-12-06: qty 1

## 2018-12-06 MED ORDER — CEPHALEXIN 500 MG PO CAPS: 500.0000 mg | ORAL_CAPSULE | Freq: Two times a day (BID) | ORAL | 0 refills | Status: AC

## 2018-12-06 MED ORDER — LABETALOL HCL 200 MG PO TABS: 200.0000 mg | ORAL_TABLET | Freq: Two times a day (BID) | ORAL | 1 refills | Status: DC

## 2018-12-06 MED ORDER — PRENATAL VITAMIN 27-0.8 MG PO TABS: 1.0000 | ORAL_TABLET | Freq: Every day | ORAL | 3 refills | Status: DC

## 2018-12-06 NOTE — ED Notes (Signed)
Bed: WTR5 Expected date:  Expected time:  Means of arrival:  Comments: 

## 2018-12-06 NOTE — Discharge Instructions (Addendum)
Please see the information and instructions below regarding your visit.  Your diagnoses today include:  1. Acute left-sided thoracic back pain   2. First trimester pregnancy   3. Elevated blood pressure reading     Tests performed today include: See side panel of your discharge paperwork for testing performed today. Vital signs are listed at the bottom of these instructions.   Medications prescribed:    Take any prescribed medications only as prescribed, and any over the counter medications only as directed on the packaging.  Please try taking Keflex twice a day for 10 days.  We are running a urine culture.  If your urine grows out something not covered by Keflex you will be called by pharmacy.  Please start taking labetalol 200 mg tablets twice a day.  These follow-up with your OB/GYN for blood pressure recheck in 1 week.  Please try taking your prenatal vitamin once a day.  Do not take medication such as ibuprofen, naproxen, and aspirin for pain during pregnancy.  Please also stop all of your muscle relaxants.  Home care instructions:  Please follow any educational materials contained in this packet.   Follow-up instructions: Please follow-up with [facility] in one week.   Return instructions:  Please return to the Emergency Department if you experience worsening symptoms.  Please come back to the emergency department for any worsening pain, lower abdominal pain, vaginal bleeding, fever or chills, weakness or numbness of lower extremities, numbness in groin, loss of bowel or bladder control. Please return if you have any other emergent concerns.  Additional Information:   Your vital signs today were: BP (!) 165/112    Pulse 82    Temp 98.7 F (37.1 C) (Oral)    Resp 18    Ht 5\' 8"  (1.727 m)    Wt 90.7 kg    LMP 11/06/2018    SpO2 100%    BMI 30.41 kg/m  If your blood pressure (BP) was elevated on multiple readings during this visit above 130 for the top number or above 80 for  the bottom number, please have this repeated by your primary care provider within one month. --------------  Thank you for allowing [place] to participate in your care today.
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
## 2018-12-08 LAB — URINE CULTURE: Culture: >=100000 — AB

## 2018-12-09 ENCOUNTER — Ambulatory Visit: Payer: Medicaid Other | Admitting: Family Medicine

## 2018-12-09 ENCOUNTER — Encounter (HOSPITAL_COMMUNITY): Payer: Self-pay | Admitting: *Deleted

## 2018-12-09 ENCOUNTER — Inpatient Hospital Stay (HOSPITAL_COMMUNITY): Payer: Medicaid Other

## 2018-12-09 ENCOUNTER — Other Ambulatory Visit: Payer: Self-pay

## 2018-12-09 ENCOUNTER — Telehealth: Payer: Self-pay

## 2018-12-09 ENCOUNTER — Encounter: Payer: Self-pay | Admitting: Family Medicine

## 2018-12-09 ENCOUNTER — Inpatient Hospital Stay (HOSPITAL_COMMUNITY)
Admission: AD | Admit: 2018-12-09 | Discharge: 2018-12-09 | Disposition: A | Discharge status: Home / Self Care | Payer: Medicaid Other | Attending: Obstetrics & Gynecology | Admitting: Obstetrics & Gynecology

## 2018-12-09 VITALS — BP 150/92

## 2018-12-09 DIAGNOSIS — R109 Unspecified abdominal pain: Secondary | ICD-10-CM | POA: Diagnosis not present

## 2018-12-09 DIAGNOSIS — O26891 Other specified pregnancy related conditions, first trimester: Secondary | ICD-10-CM | POA: Insufficient documentation

## 2018-12-09 DIAGNOSIS — Z3A01 Less than 8 weeks gestation of pregnancy: Secondary | ICD-10-CM | POA: Diagnosis not present

## 2018-12-09 DIAGNOSIS — O4690 Antepartum hemorrhage, unspecified, unspecified trimester: Secondary | ICD-10-CM

## 2018-12-09 DIAGNOSIS — Z3491 Encounter for supervision of normal pregnancy, unspecified, first trimester: Secondary | ICD-10-CM

## 2018-12-09 DIAGNOSIS — O4691 Antepartum hemorrhage, unspecified, first trimester: Secondary | ICD-10-CM

## 2018-12-09 DIAGNOSIS — O99331 Smoking (tobacco) complicating pregnancy, first trimester: Secondary | ICD-10-CM | POA: Insufficient documentation

## 2018-12-09 DIAGNOSIS — O209 Hemorrhage in early pregnancy, unspecified: Secondary | ICD-10-CM | POA: Diagnosis not present

## 2018-12-09 DIAGNOSIS — F17210 Nicotine dependence, cigarettes, uncomplicated: Secondary | ICD-10-CM | POA: Diagnosis not present

## 2018-12-09 DIAGNOSIS — O10919 Unspecified pre-existing hypertension complicating pregnancy, unspecified trimester: Secondary | ICD-10-CM

## 2018-12-09 DIAGNOSIS — O10911 Unspecified pre-existing hypertension complicating pregnancy, first trimester: Secondary | ICD-10-CM | POA: Diagnosis not present

## 2018-12-09 DIAGNOSIS — O10011 Pre-existing essential hypertension complicating pregnancy, first trimester: Secondary | ICD-10-CM | POA: Diagnosis not present

## 2018-12-09 LAB — WET PREP, GENITAL
Sperm: NONE SEEN
Trich, Wet Prep: NONE SEEN
Yeast Wet Prep HPF POC: NONE SEEN

## 2018-12-09 LAB — CBC
HCT: 36.5 % (ref 36.0–46.0)
Hemoglobin: 12.1 g/dL (ref 12.0–15.0)
MCH: 30.6 pg (ref 26.0–34.0)
MCHC: 33.2 g/dL (ref 30.0–36.0)
MCV: 92.4 fL (ref 80.0–100.0)
Platelets: 161 10*3/uL (ref 150–400)
RBC: 3.95 MIL/uL (ref 3.87–5.11)
RDW: 12.8 % (ref 11.5–15.5)
WBC: 6.4 10*3/uL (ref 4.0–10.5)
nRBC: 0 % (ref 0.0–0.2)

## 2018-12-09 LAB — URINALYSIS, ROUTINE W REFLEX MICROSCOPIC
Bilirubin Urine: NEGATIVE
Glucose, UA: NEGATIVE mg/dL
Hgb urine dipstick: NEGATIVE
Ketones, ur: NEGATIVE mg/dL
Nitrite: NEGATIVE
Protein, ur: 30 mg/dL — AB
Specific Gravity, Urine: 1.025 (ref 1.005–1.030)
WBC, UA: >50 WBC/hpf — ABNORMAL HIGH (ref 0–5)
pH: 6 (ref 5.0–8.0)

## 2018-12-09 LAB — HCG, QUANTITATIVE, PREGNANCY: hCG, Beta Chain, Quant, S: 2785 m[IU]/mL — ABNORMAL HIGH (ref <5)

## 2018-12-09 MED ORDER — METRONIDAZOLE 0.75 % VA GEL: 1.0000 | Freq: Every day | VAGINAL | 1 refills | Status: DC

## 2018-12-09 MED ORDER — LABETALOL HCL 200 MG PO TABS: 400.0000 mg | ORAL_TABLET | Freq: Two times a day (BID) | ORAL | 2 refills | Status: DC

## 2018-12-09 MED ORDER — LABETALOL HCL 100 MG PO TABS
200.0000 mg | ORAL_TABLET | Freq: Once | ORAL | Status: AC
  Administered 2018-12-09: 200 mg via ORAL
  Filled 2018-12-09: qty 2

## 2018-12-09 NOTE — Telephone Encounter (Signed)
Post ED Visit - Positive Culture Follow-up  Culture report reviewed by antimicrobial stewardship pharmacist: [place] Team []  Elenor Quinones, Pharm.D. []  Heide Guile, Pharm.D., BCPS AQ-ID []  Parks Neptune, Pharm.D., BCPS []  Alycia Rossetti, Pharm.D., BCPS []  [place], [place].D., BCPS, AAHIVP []  Legrand Como, Pharm.D., BCPS, AAHIVP []  Salome Arnt, PharmD, BCPS []  Johnnette Gourd, PharmD, BCPS []  Hughes Better, PharmD, BCPS []  Leeroy Cha, PharmD []  Laqueta Linden, PharmD, BCPS []  Albertina Parr, PharmD  [place] Team []  Leodis Sias, PharmD []  Lindell Spar, PharmD []  Royetta Asal, PharmD []  Graylin Shiver, Rph []  Rema Fendt) Glennon Mac, PharmD []  Arlyn Dunning, PharmD []  Netta Cedars, PharmD []  Dia Sitter, PharmD []  Leone Haven, PharmD []  Gretta Arab, PharmD [x]  Theodis Shove, PharmD []  Peggyann Juba, PharmD []  Reuel Boom, PharmD   Positive urine culture Treated with Cephalexin, organism sensitive to the same and no further patient follow-up is required at this time.  Genia Del 12/09/2018, 10:41 AM

## 2018-12-09 NOTE — Progress Notes (Signed)
    Subjective:  Terri [name] is a 33 y.o. female who presents to the [facility] today with a chief complaint of abdominal cramping in pregnancy.   HPI:  Per chart review patient was found to have a positive pregnancy test on 6/26 in the ED in the setting of a work-up for back pain.  She has been given Toradol prior to this discovery.  Patient states that she has been having intermittent abdominal cramping for the last several days.  Is mostly upper bilaterally but worse in the left upper quadrant.  She states that this is relatively mild and associated with some nausea.  She has not had any morning sickness lately.  She has had no leaking or bleeding. She was not on any birth control prior to this.  This pregnancy is unplanned but desired.  She was switched over to labetalol from hydrochlorothiazide per OB/GYN instructions.  She plans to follow-up for me know for this pregnancy like she did for her last pregnancy. She was not on any birth control and has had regular periods. She is now taking prenatal vitamins  ROS: Per HPI  Social history: She is still smoking marijuana.  She is trying to quit ever since she found out she is pregnant. she voices high motivation to stop using marijuana.  CC, SH/smoking status, and VS noted  Objective:  Physical Exam: BP (!) 150/92   LMP 11/09/2018 (Exact Date)   Gen: NAD, resting comfortably Pulm: NWOB GI: Normal bowel sounds present. Soft, Nontender, Nondistended. MSK: no edema, cyanosis, or clubbing noted Skin: warm, dry Neuro: grossly normal, moves all extremities Psych: Normal affect and thought content   Assessment/Plan:  Abdominal pain during pregnancy in first trimester Check transvaginal ultrasound and beta quant today to see if viable pregnancy and rule out ectopic.  Patient given strict ED precautions in case of worsening abdominal pain prior to her ultrasound which is scheduled for 7/7.  She will schedule with [facility] for initial prenatal  visit.  Discussed continuing labetalol and prenatal vitamins.  Patient voiced good understanding   Orders Placed This Encounter  Procedures  . US OB Transvaginal    Standing Status:   Future    Standing Expiration Date:   02/08/2020    Order Specific Question:   Reason for Exam (SYMPTOM  OR DIAGNOSIS REQUIRED)    Answer:   r/o ectopic    Order Specific Question:   Preferred Imaging Location?    Answer:   [facility]  . hCG, quantitative, pregnancy    Bufford Lope, DO PGY-3, [facility] 12/09/2018 2:17 PM

## 2018-12-09 NOTE — Assessment & Plan Note (Signed)
Check transvaginal ultrasound and beta quant today to see if viable pregnancy and rule out ectopic.  Patient given strict ED precautions in case of worsening abdominal pain prior to her ultrasound which is scheduled for 7/7.  She will schedule with [facility] for initial prenatal visit.  Discussed continuing labetalol and prenatal vitamins.  Patient voiced good understanding

## 2018-12-09 NOTE — Addendum Note (Signed)
Addended by: Valerie Roys on: 12/09/2018 02:58 PM   Modules accepted: Orders

## 2018-12-09 NOTE — MAU Provider Note (Signed)
History     CSN: [id number]  Arrival date and time: 12/09/18 2031   First Provider Initiated Contact with Patient 12/09/18 2100       Chief Complaint  Patient presents with  . Abdominal Pain  . Vaginal Bleeding   Terri [name] is a 33 y.o. G2P1 at [redacted]w[redacted]d by LMP who presents to MAU with complaints of abdominal pain and vaginal bleeding. She reports being seen today as a walk in at the [facility] for abdominal cramping. Reports lower abdominal cramping that started occurring yesterday. Rates pain 7/10- has not taken any medication for abdominal pain. She reports vaginal discharge started occurring tonight when she got home, describes as light pinkish red spotting when she wipes. Denies having to wear a pad or panty liner. Denies passing clots. Plans to receive prenatal care at [facility], pregnancy is complicated by [facility] on labetalol 200mg  BID.    OB History    Gravida  2   Para  1   Term  1   Preterm      AB      Living  1     SAB      TAB      Ectopic      Multiple      Live Births  1           Past Medical History:  Diagnosis Date  . Asthma   . Hypertension   . Hypertention, malignant, with acute intensive management     History reviewed. No pertinent surgical history.  Family History  Problem Relation Age of Onset  . Hypertension Mother     Social History   Tobacco Use  . Smoking status: Current Every Day Smoker    Packs/day: 0.20    Types: Cigarettes  . Smokeless tobacco: Never Used  Substance Use Topics  . Alcohol use: Not Currently    Comment: daily   . Drug use: Yes    Types: Marijuana    Comment: everyday     Allergies:  Allergies  Allergen Reactions  . Vicodin [Hydrocodone-Acetaminophen] Itching    Medications Prior to Admission  Medication Sig Dispense Refill Last Dose  . cephALEXin (KEFLEX) 500 MG capsule Take 1 capsule (500 mg total) by mouth 2 (two) times daily for 10 days. 20 capsule 0 12/09/2018 at Unknown time  . labetalol  (NORMODYNE) 200 MG tablet Take 1 tablet (200 mg total) by mouth 2 (two) times daily. 60 tablet 1 12/09/2018 at Unknown time  . Prenatal Vit-Fe Fumarate-FA (PRENATAL VITAMIN) 27-0.8 MG TABS Take 1 tablet by mouth daily. 30 tablet 3 12/09/2018 at Unknown time    Review of Systems  Constitutional: Negative.   Respiratory: Negative.   Cardiovascular: Negative.   Gastrointestinal: Positive for abdominal pain. Negative for constipation, diarrhea, nausea and vomiting.  Genitourinary: Positive for vaginal bleeding. Negative for difficulty urinating, dysuria, frequency, pelvic pain and urgency.  Musculoskeletal: Negative.   Neurological: Negative.    Physical Exam   Patient Vitals for the past 24 hrs:  BP Temp Temp src Pulse Resp Weight  12/09/18 2250 - 98.5 F (36.9 C) Oral - - -  12/09/18 2230 132/90 - - 92 - -  12/09/18 2215 (!) 141/86 - - 96 - -  12/09/18 2211 (!) 143/96 - - 85 - -  12/09/18 2209 (!) 143/96 - - 85 - -  12/09/18 2113 (!) 163/116 - Oral 95 - -  12/09/18 2041 (!) 154/100 98.1 F (36.7 C) Oral 97 18 113.1  kg    Physical Exam  Nursing note and vitals reviewed. Constitutional: She is oriented to person, place, and time. She appears well-developed and well-nourished. No distress.  Cardiovascular: Normal rate and regular rhythm.  Respiratory: Effort normal and breath sounds normal. No respiratory distress. She has no wheezes. She has no rales.  GI: Soft. She exhibits no distension. There is no abdominal tenderness. There is no rebound.  Genitourinary:    Genitourinary Comments: Pelvic exam: Cervix pink, visually closed, without lesion, small amount of pinkish red bleeding present at ce rvical os and vaginal walls, external genitalia normal Bimanual exam: Cervix 0/long/high, firm, anterior, neg CMT, uterus nontender, nonenlarged, adnexa without tenderness, enlargement, or mass   Musculoskeletal: Normal range of motion.        General: No edema.  Neurological: She is alert and  oriented to person, place, and time.  Psychiatric: She has a normal mood and affect. Her behavior is normal. Thought content normal.   MAU Course  Procedures  MDM Orders Placed This Encounter  Procedures  . Wet prep, genital  . US OB LESS THAN 14 WEEKS WITH OB TRANSVAGINAL  . Urinalysis, Routine w reflex microscopic  . CBC  . hCG, quantitative, pregnancy   Meds ordered this encounter  Medications  . labetalol (NORMODYNE) tablet 200 mg   Labs and [place] report reviewed:  Results for orders placed or performed during the hospital encounter of 12/09/18 (from the past 24 hour(s))  CBC     Status: None   Collection Time: 12/09/18  8:53 PM  Result Value Ref Range   WBC 6.4 4.0 - 10.5 K/uL   RBC 3.95 3.87 - 5.11 MIL/uL   Hemoglobin 12.1 12.0 - 15.0 g/dL   HCT 36.5 36.0 - 46.0 %   MCV 92.4 80.0 - 100.0 fL   MCH 30.6 26.0 - 34.0 pg   MCHC 33.2 30.0 - 36.0 g/dL   RDW 12.8 11.5 - 15.5 %   Platelets 161 150 - 400 K/uL   nRBC 0.0 0.0 - 0.2 %  hCG, quantitative, pregnancy     Status: Abnormal   Collection Time: 12/09/18  8:53 PM  Result Value Ref Range   hCG, Beta Chain, Quant, S 2,785 (H) <5 mIU/mL  Urinalysis, Routine w reflex microscopic     Status: Abnormal   Collection Time: 12/09/18  8:59 PM  Result Value Ref Range   Color, Urine YELLOW YELLOW   APPearance HAZY (A) CLEAR   Specific Gravity, Urine 1.025 1.005 - 1.030   pH 6.0 5.0 - 8.0   Glucose, UA NEGATIVE NEGATIVE mg/dL   Hgb urine dipstick NEGATIVE NEGATIVE   Bilirubin Urine NEGATIVE NEGATIVE   Ketones, ur NEGATIVE NEGATIVE mg/dL   Protein, ur 30 (A) NEGATIVE mg/dL   Nitrite NEGATIVE NEGATIVE   Leukocytes,Ua MODERATE (A) NEGATIVE   RBC / HPF 0-5 0 - 5 RBC/hpf   WBC, UA >50 (H) 0 - 5 WBC/hpf   Bacteria, UA RARE (A) NONE SEEN   Squamous Epithelial / LPF 0-5 0 - 5   Mucus PRESENT    Ca Oxalate Crys, UA PRESENT   Wet prep, genital     Status: Abnormal   Collection Time: 12/09/18  9:19 PM  Result Value Ref Range   Yeast  Wet Prep HPF POC NONE SEEN NONE SEEN   Trich, Wet Prep NONE SEEN NONE SEEN   Clue Cells Wet Prep HPF POC PRESENT (A) NONE SEEN   WBC, Wet Prep HPF POC MANY (A)  NONE SEEN   Sperm NONE SEEN    US Ob Less Than 14 Weeks With Ob Transvaginal  Result Date: 12/09/2018 CLINICAL DATA:  Bleeding EXAM: OBSTETRIC <14 WK [place] AND TRANSVAGINAL OB US TECHNIQUE: Both transabdominal and transvaginal ultrasound examinations were performed for complete evaluation of the gestation as well as the maternal uterus, adnexal regions, and pelvic cul-de-sac. Transvaginal technique was performed to assess early pregnancy. COMPARISON:  None. FINDINGS: Intrauterine gestational sac: Single Yolk sac:  Visualized. Embryo:  Not Visualized. Cardiac Activity: Not Visualized. MSD: 5.7 mm   5 w   2 d Subchorionic hemorrhage:  None visualized. Maternal uterus/adnexae: The ovaries are unremarkable. Multiple fibroids are noted measuring up to approximately 3.4 cm. There is a small amount of free fluid. IMPRESSION: Probable early intrauterine gestational sac, but no yolk sac, fetal pole, or cardiac activity yet visualized. Recommend follow-up quantitative B-HCG levels and follow-up US in 14 days to assess viability. This recommendation follows SRU consensus guidelines: Diagnostic Criteria for Nonviable Pregnancy Early in the First Trimester. [place] Med 2013; 563:1497-02. Multiple fibroids are noted. Electronically Signed   By: Constance Holster M.D.   On: 12/09/2018 21:55   BP stable after additional dose of labetalol, discussed increasing dosage of labetalol with Dr Harolyn Rutherford, recommends 400mg  BID   Discussed results of [place] and lab work with patient. Normal IUP seen in [place] with GS and yolk sac, no embryo at this time. F/u US for viability ordered for 14 days from today. Educated on threatened miscarriage precautions and reasons to return to MAU. Encouraged to make initial prenatal appointment at [facility]. Rx for Metrogel sent to pharmacy of choice,  continue Keflex for UTI. Pt stable at time of discharge.  Assessment and Plan   1. Normal IUP (intrauterine pregnancy) on prenatal ultrasound, first trimester   2. Vaginal bleeding during pregnancy   3. Abdominal pain during pregnancy in first trimester   4. Chronic hypertension affecting pregnancy    Discharge home Make initial prenatal appointment  Return to MAU as needed Continue medication as prescribed  Increased labetalol to 400mg  BID  F/u US in 14 days for viability  Threatened miscarriage precautions   Follow-up [place]. Schedule an appointment as soon as possible for a visit in 6 week(s).   Specialty: Obstetrics and Gynecology Why: Make appointment to be seen for initial prenatal appointment  Contact information: [address] [place] [phone]         Allergies as of 12/09/2018      Reactions   Vicodin [hydrocodone-acetaminophen] Itching      Medication List    TAKE these medications   cephALEXin 500 MG capsule Commonly known as: KEFLEX Take 1 capsule (500 mg total) by mouth 2 (two) times daily for 10 days.   labetalol 200 MG tablet Commonly known as: NORMODYNE Take 2 tablets (400 mg total) by mouth 2 (two) times daily. What changed: how much to take   metroNIDAZOLE 0.75 % vaginal gel Commonly known as: METROGEL Place 1 Applicatorful vaginally at bedtime. Apply one applicatorful to vagina at bedtime for 5 days   Prenatal Vitamin 27-0.8 MG Tabs Take 1 tablet by mouth daily.      Lajean Manes CNM 12/09/2018, 10:55 PM

## 2018-12-09 NOTE — Patient Instructions (Signed)
Please get your ultrasound done.  We will look at the levels of both and let you know if there is anything to be done.  Please schedule your first prenatal visit with [facility] as planned.  Any worsening abdominal pain in the meanwhile should be evaluated at the Maternity Admissions Unit at the [facility] in [facility].

## 2018-12-09 NOTE — MAU Note (Signed)
Pt presents to MAU c/o vaginal bleeding. Pt was seen in the office today due to abdominal cramping and did some blood work. However when she returned home she noticed some pink spotting when she wiped. Pt did not put on a pad. Pts LMP was May 30th 2020.   Temp: 98.1 BP: 154/100 Pulse: 97 Pain:  8/10 abdominal cramping.

## 2018-12-10 LAB — GC/CHLAMYDIA PROBE AMP (~~LOC~~) NOT AT ARMC
Chlamydia: NEGATIVE
Neisseria Gonorrhea: NEGATIVE

## 2018-12-10 LAB — CULTURE, OB URINE: Culture: NO GROWTH

## 2018-12-10 LAB — BETA HCG QUANT (REF LAB): hCG Quant: 1957 m[IU]/mL

## 2018-12-11 ENCOUNTER — Other Ambulatory Visit: Payer: Self-pay

## 2018-12-11 ENCOUNTER — Encounter (HOSPITAL_COMMUNITY): Payer: Self-pay | Admitting: *Deleted

## 2018-12-11 ENCOUNTER — Inpatient Hospital Stay (HOSPITAL_COMMUNITY)
Admission: AD | Admit: 2018-12-11 | Discharge: 2018-12-11 | Disposition: A | Discharge status: Home / Self Care | Payer: Medicaid Other | Attending: Obstetrics and Gynecology | Admitting: Obstetrics and Gynecology

## 2018-12-11 ENCOUNTER — Inpatient Hospital Stay (HOSPITAL_COMMUNITY): Payer: Medicaid Other

## 2018-12-11 DIAGNOSIS — R609 Edema, unspecified: Secondary | ICD-10-CM

## 2018-12-11 DIAGNOSIS — O4691 Antepartum hemorrhage, unspecified, first trimester: Secondary | ICD-10-CM

## 2018-12-11 DIAGNOSIS — F17210 Nicotine dependence, cigarettes, uncomplicated: Secondary | ICD-10-CM | POA: Insufficient documentation

## 2018-12-11 DIAGNOSIS — O209 Hemorrhage in early pregnancy, unspecified: Secondary | ICD-10-CM | POA: Insufficient documentation

## 2018-12-11 DIAGNOSIS — O4690 Antepartum hemorrhage, unspecified, unspecified trimester: Secondary | ICD-10-CM

## 2018-12-11 DIAGNOSIS — Z6790 Unspecified blood type, Rh positive: Secondary | ICD-10-CM

## 2018-12-11 DIAGNOSIS — T7491XA Unspecified adult maltreatment, confirmed, initial encounter: Secondary | ICD-10-CM

## 2018-12-11 DIAGNOSIS — Z3A01 Less than 8 weeks gestation of pregnancy: Secondary | ICD-10-CM | POA: Diagnosis not present

## 2018-12-11 DIAGNOSIS — O99331 Smoking (tobacco) complicating pregnancy, first trimester: Secondary | ICD-10-CM | POA: Diagnosis not present

## 2018-12-11 DIAGNOSIS — M25531 Pain in right wrist: Secondary | ICD-10-CM | POA: Diagnosis not present

## 2018-12-11 DIAGNOSIS — R52 Pain, unspecified: Secondary | ICD-10-CM

## 2018-12-11 LAB — URINALYSIS, ROUTINE W REFLEX MICROSCOPIC
Bacteria, UA: NONE SEEN
Bilirubin Urine: NEGATIVE
Glucose, UA: NEGATIVE mg/dL
Hgb urine dipstick: NEGATIVE
Ketones, ur: 5 mg/dL — AB
Nitrite: NEGATIVE
Protein, ur: NEGATIVE mg/dL
Specific Gravity, Urine: 1.02 (ref 1.005–1.030)
pH: 5 (ref 5.0–8.0)

## 2018-12-11 LAB — HCG, QUANTITATIVE, PREGNANCY: hCG, Beta Chain, Quant, S: 4812 m[IU]/mL — ABNORMAL HIGH (ref <5)

## 2018-12-11 LAB — CBC
HCT: 36.2 % (ref 36.0–46.0)
Hemoglobin: 11.9 g/dL — ABNORMAL LOW (ref 12.0–15.0)
MCH: 30.7 pg (ref 26.0–34.0)
MCHC: 32.9 g/dL (ref 30.0–36.0)
MCV: 93.3 fL (ref 80.0–100.0)
Platelets: 293 10*3/uL (ref 150–400)
RBC: 3.88 MIL/uL (ref 3.87–5.11)
RDW: 12.9 % (ref 11.5–15.5)
WBC: 7.7 10*3/uL (ref 4.0–10.5)
nRBC: 0 % (ref 0.0–0.2)
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
## 2018-12-11 NOTE — Discharge Instructions (Signed)
Domestic Violence and Pregnancy Domestic violence is any type of physical, sexual, or emotional harm done by a current or former partner. Emotional abuse includes threatening and controlling behavior. Stalking is an example of threatening behavior. This type of abuse can happen to anyone, at any time in a relationship. Domestic violence is also called intimate partner violence. Intimate partner violence is especially dangerous during pregnancy because the abuse may affect both you and your developing baby. Call a domestic violence hotline or let your health care provider know if you are being physically or sexually abused, or if your partner's threatening or controlling behavior is making you feel unsafe. Getting help and support protects you, your pregnancy, and your baby. How does this affect me? For you, the negative effects of intimate partner violence can include:  Physical injury or death (homicide).  Emotional stress and fear.  Trouble sleeping.  Loss of appetite.  Digestion problems.  Frequent infections, including sexually transmitted infections.  High blood pressure.  Depression.  Anxiety.  Thoughts of hurting yourself or committing suicide. Intimate partner violence may negatively affect your pregnancy in these ways:  You are more likely to be injured or have poor health.  You may be less likely to get prenatal care.  You may not have good nutrition or gain a healthy amount of weight.  You may be more likely to smoke, use drugs, and drink alcohol during pregnancy to relieve stress.  You may be at higher risk of losing your pregnancy (miscarriage or stillbirth).  Your baby may be born before 25 weeks of pregnancy (premature). How does this affect my baby? If you experience intimate partner violence during pregnancy:  Your baby may be injured.  Your baby may not survive pregnancy (miscarriage or stillbirth).  Your baby may be born premature, which can cause  mental and physical problems.  Your baby may not grow well in your womb and may be born small (small for gestational age).  If you use alcohol, your baby may be born with fetal alcohol syndrome, which can cause birth defects and other problems.  You may have trouble bonding with your baby, which can lead to neglect.  You may be less likely to breastfeed, which is the healthiest way to feed your baby. Follow these instructions at home:   Let your health care provider know that you are experiencing intimate partner violence.  Do not bring an abusive partner with you to your prenatal visits. This will allow you to speak freely with your provider.  Learn about and use resources on intimate partner violence, such as the [employer] Violence Hotline: VisitDestination.com.br.  Consider counseling.  Consider getting a legal document that says your partner has to stay away from you (restraining order).  Have a support person stay with you in your home.  Have an escape plan to get to a safe place.  Do not smoke,use drugs, or drink alcohol to relieve stress.  Keep all your prenatal visits as told by your health care provider. This is important. Where to find more information  Futures Without Violence: futureswithoutviolence.[place] Against Domestic Violence: https://taylor-mendoza.com/  National Network to [employer] Violence: NameSeizer.co.nz  National Resource Center on Domestic Violence: http://tanner-lang.biz/  The U.S. Department of Justice, Office on Violence Against Women: https://jones-murray.org/ Contact a health care provider if:  You experience any type of intimate partner violence at home.  You need help to quit smoking, drinking, or taking drugs. Get help right away if:  You do not feel safe at home.  You have thoughts of hurting yourself or committing suicide. If you ever feel like you may hurt yourself or others, or have thoughts about taking your own life, get help right away. You can go to your  nearest emergency department or call:  Your local emergency services (911 in the [place]  A suicide crisis helpline, such as the [place] at 914-269-0516. This is open 24 hours a day. Summary  Domestic violence is also called intimate partner violence. Intimate partner violence can be physical, sexual, or emotional.  This type of abuse can happen to anyone at any time in a relationship, but the effects are especially dangerous during pregnancy.  Intimate partner violence increases your baby's risk of miscarriage, stillbirth, and premature birth.  Tell your health care provider if you experience any type of intimate partner violence. Get help right away if you do not feel safe at home. This information is not intended to replace advice given to you by your health care provider. Make sure you discuss any questions you have with your health care provider. Document Released: 02/14/2017 Document Revised: 09/18/2018 Document Reviewed: 02/14/2017 Elsevier Patient Education  2020 [place].  Vaginal Bleeding During Pregnancy, First Trimester  A small amount of bleeding from the vagina (spotting) is relatively common during early pregnancy. It usually stops on its own. Various things may cause bleeding or spotting during early pregnancy. Some bleeding may be related to the pregnancy, and some may not. In many cases, the bleeding is normal and is not a problem. However, bleeding can also be a sign of something serious. Be sure to tell your health care provider about any vaginal bleeding right away. Some possible causes of vaginal bleeding during the first trimester include:  Infection or inflammation of the cervix.  Growths (polyps) on the cervix.  Miscarriage or threatened miscarriage.  Pregnancy tissue developing outside of the uterus (ectopic pregnancy).  A mass of tissue developing in the uterus due to an egg being fertilized incorrectly (molar  pregnancy). Follow these instructions at home: Activity  Follow instructions from your health care provider about limiting your activity. Ask what activities are safe for you.  If needed, make plans for someone to help with your regular activities.  Do not have sex or orgasms until your health care provider says that this is safe. General instructions  Take over-the-counter and prescription medicines only as told by your health care provider.  Pay attention to any changes in your symptoms.  Do not use tampons or douche.  Write down how many pads you use each day, how often you change pads, and how soaked (saturated) they are.  If you pass any tissue from your vagina, save the tissue so you can show it to your health care provider.  Keep all follow-up visits as told by your health care provider. This is important. Contact a health care provider if:  You have vaginal bleeding during any part of your pregnancy.  You have cramps or labor pains.  You have a fever. Get help right away if:  You have severe cramps in your back or abdomen.  You pass large clots or a large amount of tissue from your vagina.  Your bleeding increases.  You feel light-headed or weak, or you faint.  You have chills.  You are leaking fluid or have a gush of fluid from your vagina. Summary  A small amount of bleeding (spotting) from the vagina is  relatively common during early pregnancy.  Various things may cause bleeding or spotting in early pregnancy.  Be sure to tell your health care provider about any vaginal bleeding right away. This information is not intended to replace advice given to you by your health care provider. Make sure you discuss any questions you have with your health care provider. Document Released: 03/08/2005 Document Revised: 09/17/2018 Document Reviewed: 08/31/2016 Elsevier Patient Education  [place] Medications in Pregnancy     Acne: Benzoyl Peroxide Salicylic Acid  Backache/Headache: Tylenol: 2 regular strength every 4 hours OR              2 Extra strength every 6 hours  Colds/Coughs/Allergies: Benadryl (alcohol free) 25 mg every 6 hours as needed Breath right strips Claritin Cepacol throat lozenges Chloraseptic throat spray Cold-Eeze- up to three times per day Cough drops, alcohol free Flonase (by prescription only) Guaifenesin Mucinex Robitussin DM (plain only, alcohol free) Saline nasal spray/drops Sudafed (pseudoephedrine) & Actifed ** use only after [redacted] weeks gestation and if you do not have high blood pressure Tylenol Vicks Vaporub Zinc lozenges Zyrtec   Constipation: Colace Ducolax suppositories Fleet enema Glycerin suppositories Metamucil Milk of magnesia Miralax Senokot Smooth move tea  Diarrhea: Kaopectate Imodium A-D  *NO pepto Bismol  Hemorrhoids: Anusol Anusol HC Preparation H Tucks  Indigestion: Tums Maalox Mylanta Zantac  Pepcid  Insomnia: Benadryl (alcohol free) 25mg  every 6 hours as needed Tylenol PM Unisom, no Gelcaps  Leg Cramps: Tums MagGel  Nausea/Vomiting:  Bonine Dramamine Emetrol Ginger extract Sea bands Meclizine  Nausea medication to take during pregnancy:  Unisom (doxylamine succinate 25 mg tablets) Take one tablet daily at bedtime. If symptoms are not adequately controlled, the dose can be increased to a maximum recommended dose of two tablets daily (1/2 tablet in the morning, 1/2 tablet mid-afternoon and one at bedtime). Vitamin B6 100mg  tablets. Take one tablet twice a day (up to 200 mg per day).  Skin Rashes: Aveeno products Benadryl cream or 25mg  every 6 hours as needed Calamine Lotion 1% cortisone cream  Yeast infection: Gyne-lotrimin 7 Monistat 7   **If taking multiple medications, please check labels to avoid duplicating the same active ingredients **take medication as directed on the label ** Do not exceed  4000 mg of tylenol in 24 hours **Do not take medications that contain aspirin or ibuprofen
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
## 2018-12-17 ENCOUNTER — Ambulatory Visit (HOSPITAL_COMMUNITY): Admission: RE | Admit: 2018-12-17 | Payer: Medicaid Other | Source: Ambulatory Visit

## 2018-12-17 ENCOUNTER — Ambulatory Visit: Payer: Medicaid Other

## 2018-12-26 ENCOUNTER — Ambulatory Visit (HOSPITAL_COMMUNITY)
Admission: RE | Admit: 2018-12-26 | Discharge: 2018-12-26 | Disposition: A | Discharge status: Home / Self Care | Payer: Medicaid Other | Source: Ambulatory Visit | Attending: Family Medicine | Admitting: Family Medicine

## 2018-12-26 ENCOUNTER — Ambulatory Visit (INDEPENDENT_AMBULATORY_CARE_PROVIDER_SITE_OTHER): Payer: Medicaid Other

## 2018-12-26 ENCOUNTER — Other Ambulatory Visit: Payer: Self-pay

## 2018-12-26 ENCOUNTER — Encounter: Payer: Self-pay | Admitting: Obstetrics & Gynecology

## 2018-12-26 DIAGNOSIS — O3411 Maternal care for benign tumor of corpus uteri, first trimester: Secondary | ICD-10-CM | POA: Diagnosis not present

## 2018-12-26 DIAGNOSIS — Z3A00 Weeks of gestation of pregnancy not specified: Secondary | ICD-10-CM | POA: Diagnosis not present

## 2018-12-26 DIAGNOSIS — O208 Other hemorrhage in early pregnancy: Secondary | ICD-10-CM | POA: Diagnosis not present

## 2018-12-26 DIAGNOSIS — Z3491 Encounter for supervision of normal pregnancy, unspecified, first trimester: Secondary | ICD-10-CM | POA: Insufficient documentation

## 2018-12-26 DIAGNOSIS — Z712 Person consulting for explanation of examination or test findings: Secondary | ICD-10-CM

## 2018-12-26 MED ORDER — PROMETHAZINE HCL 25 MG PO TABS: 25.0000 mg | ORAL_TABLET | Freq: Four times a day (QID) | ORAL | 0 refills | Status: DC | PRN

## 2018-12-26 NOTE — Progress Notes (Signed)
Pt here today for OB US results.  Pt informed that she has a viable pregnancy according to [place] she is 7w 3d today with EDD 08/11/19.  I also informed pt that the US showed that she has small fibroids.  I explained to the pt to not be alarmed, there is nothing to do with them unless they give her problems like heavy vaginal bleeding or severe pain.  Pt states that she has an appt with [facility].  Pt also c/o nausea in which she can not keep down her BP medication.  I advised pt that she can take Phenergan about 30 min to an hour before taking her medication to see if that is effective.   I also informed pt that Phenergan can cause drowsiness if that happens then she can take half of the tablet.  I explained to the pt that if that is not effective to please call [facility] for f/u.  Proof of pregnancy letter provided by the front office.  Pt verbalized understanding.  Mel Almond, RN 12/26/18

## 2018-12-31 NOTE — Progress Notes (Signed)
I have reviewed this chart and agree with the RN/CMA assessment and management.    Timmy Bubeck C Jalin Erpelding, MD, FACOG Attending Physician, [facility]  

## 2019-01-07 ENCOUNTER — Other Ambulatory Visit: Payer: Self-pay

## 2019-01-07 ENCOUNTER — Ambulatory Visit: Payer: Medicaid Other

## 2019-01-07 ENCOUNTER — Inpatient Hospital Stay (HOSPITAL_COMMUNITY)
Admission: AD | Admit: 2019-01-07 | Discharge: 2019-01-07 | Disposition: A | Discharge status: Home / Self Care | Payer: Medicaid Other | Attending: Obstetrics and Gynecology | Admitting: Obstetrics and Gynecology

## 2019-01-07 ENCOUNTER — Encounter (HOSPITAL_COMMUNITY): Payer: Self-pay | Admitting: *Deleted

## 2019-01-07 DIAGNOSIS — O10011 Pre-existing essential hypertension complicating pregnancy, first trimester: Secondary | ICD-10-CM | POA: Diagnosis not present

## 2019-01-07 DIAGNOSIS — Z87891 Personal history of nicotine dependence: Secondary | ICD-10-CM | POA: Diagnosis not present

## 2019-01-07 DIAGNOSIS — O219 Vomiting of pregnancy, unspecified: Secondary | ICD-10-CM

## 2019-01-07 DIAGNOSIS — Z3A08 8 weeks gestation of pregnancy: Secondary | ICD-10-CM

## 2019-01-07 DIAGNOSIS — O2311 Infections of bladder in pregnancy, first trimester: Secondary | ICD-10-CM | POA: Diagnosis not present

## 2019-01-07 DIAGNOSIS — K117 Disturbances of salivary secretion: Secondary | ICD-10-CM | POA: Diagnosis not present

## 2019-01-07 DIAGNOSIS — O99619 Diseases of the digestive system complicating pregnancy, unspecified trimester: Secondary | ICD-10-CM

## 2019-01-07 DIAGNOSIS — O210 Mild hyperemesis gravidarum: Secondary | ICD-10-CM | POA: Diagnosis present

## 2019-01-07 DIAGNOSIS — R42 Dizziness and giddiness: Secondary | ICD-10-CM

## 2019-01-07 DIAGNOSIS — O10919 Unspecified pre-existing hypertension complicating pregnancy, unspecified trimester: Secondary | ICD-10-CM

## 2019-01-07 DIAGNOSIS — O09899 Supervision of other high risk pregnancies, unspecified trimester: Secondary | ICD-10-CM

## 2019-01-07 DIAGNOSIS — O26891 Other specified pregnancy related conditions, first trimester: Secondary | ICD-10-CM

## 2019-01-07 DIAGNOSIS — N3000 Acute cystitis without hematuria: Secondary | ICD-10-CM

## 2019-01-07 HISTORY — DX: Supervision of other high risk pregnancies, unspecified trimester: O09.899

## 2019-01-07 LAB — COMPREHENSIVE METABOLIC PANEL
ALT: 16 U/L (ref 0–44)
AST: 13 U/L — ABNORMAL LOW (ref 15–41)
Albumin: 3.4 g/dL — ABNORMAL LOW (ref 3.5–5.0)
Alkaline Phosphatase: 44 U/L (ref 38–126)
Anion gap: 10 (ref 5–15)
BUN: 8 mg/dL (ref 6–20)
CO2: 22 mmol/L (ref 22–32)
Calcium: 9.1 mg/dL (ref 8.9–10.3)
Chloride: 104 mmol/L (ref 98–111)
Creatinine, Ser: 0.93 mg/dL (ref 0.44–1.00)
GFR calc Af Amer: >60 mL/min (ref >60)
GFR calc non Af Amer: >60 mL/min (ref >60)
Glucose, Bld: 114 mg/dL — ABNORMAL HIGH (ref 70–99)
Potassium: 3.7 mmol/L (ref 3.5–5.1)
Sodium: 136 mmol/L (ref 135–145)
Total Bilirubin: 0.4 mg/dL (ref 0.3–1.2)
Total Protein: 6.3 g/dL — ABNORMAL LOW (ref 6.5–8.1)

## 2019-01-07 LAB — URINALYSIS, ROUTINE W REFLEX MICROSCOPIC
Bilirubin Urine: NEGATIVE
Glucose, UA: NEGATIVE mg/dL
Hgb urine dipstick: NEGATIVE
Ketones, ur: NEGATIVE mg/dL
Nitrite: POSITIVE — AB
Protein, ur: 30 mg/dL — AB
Specific Gravity, Urine: 1.026 (ref 1.005–1.030)
WBC, UA: >50 WBC/hpf — ABNORMAL HIGH (ref 0–5)
pH: 5 (ref 5.0–8.0)

## 2019-01-07 LAB — CBC WITH DIFFERENTIAL/PLATELET
Abs Immature Granulocytes: 0.01 10*3/uL (ref 0.00–0.07)
Basophils Absolute: 0 10*3/uL (ref 0.0–0.1)
Basophils Relative: 0 %
Eosinophils Absolute: 0 10*3/uL (ref 0.0–0.5)
Eosinophils Relative: 0 %
HCT: 33.5 % — ABNORMAL LOW (ref 36.0–46.0)
Hemoglobin: 11.4 g/dL — ABNORMAL LOW (ref 12.0–15.0)
Immature Granulocytes: 0 %
Lymphocytes Relative: 33 %
Lymphs Abs: 1.6 10*3/uL (ref 0.7–4.0)
MCH: 31.6 pg (ref 26.0–34.0)
MCHC: 34 g/dL (ref 30.0–36.0)
MCV: 92.8 fL (ref 80.0–100.0)
Monocytes Absolute: 0.4 10*3/uL (ref 0.1–1.0)
Monocytes Relative: 8 %
Neutro Abs: 2.8 10*3/uL (ref 1.7–7.7)
Neutrophils Relative %: 59 %
Platelets: 252 10*3/uL (ref 150–400)
RBC: 3.61 MIL/uL — ABNORMAL LOW (ref 3.87–5.11)
RDW: 11.8 % (ref 11.5–15.5)
WBC: 4.9 10*3/uL (ref 4.0–10.5)
nRBC: 0 % (ref 0.0–0.2)

## 2019-01-07 MED ORDER — FAMOTIDINE 20 MG PO TABS: 20.0000 mg | ORAL_TABLET | Freq: Every evening | ORAL | 1 refills | Status: DC

## 2019-01-07 MED ORDER — FAMOTIDINE IN NACL 20-0.9 MG/50ML-% IV SOLN
20.0000 mg | Freq: Once | INTRAVENOUS | Status: AC
  Administered 2019-01-07: 20 mg via INTRAVENOUS
  Filled 2019-01-07: qty 50

## 2019-01-07 MED ORDER — LACTATED RINGERS IV BOLUS
1000.0000 mL | Freq: Once | INTRAVENOUS | Status: AC
  Administered 2019-01-07: 1000 mL via INTRAVENOUS

## 2019-01-07 MED ORDER — BLOOD PRESSURE MONITORING KIT: 1.0000 | PACK | 0 refills | Status: DC

## 2019-01-07 MED ORDER — CEFADROXIL 500 MG PO CAPS: 500.0000 mg | ORAL_CAPSULE | Freq: Two times a day (BID) | ORAL | 0 refills | Status: AC

## 2019-01-07 MED ORDER — PRENATE MINI 18-0.6-0.4-350 MG PO CAPS: 1.0000 | ORAL_CAPSULE | Freq: Every day | ORAL | 12 refills | Status: DC

## 2019-01-07 MED ORDER — GLYCOPYRROLATE 1 MG PO TABS: 1.0000 mg | ORAL_TABLET | Freq: Three times a day (TID) | ORAL | 0 refills | Status: DC

## 2019-01-07 MED ORDER — LABETALOL HCL 100 MG PO TABS
400.0000 mg | ORAL_TABLET | Freq: Once | ORAL | Status: AC
  Administered 2019-01-07: 400 mg via ORAL
  Filled 2019-01-07: qty 4

## 2019-01-07 MED ORDER — PROMETHAZINE HCL 25 MG/ML IJ SOLN
25.0000 mg | Freq: Four times a day (QID) | INTRAMUSCULAR | Status: DC | PRN
  Administered 2019-01-07: 25 mg via INTRAVENOUS
  Filled 2019-01-07: qty 1

## 2019-01-07 NOTE — Progress Notes (Signed)
   The patient was scheduled for virtual NOB intake, patient came into the office today instead.   History of Present Illness: PRENATAL INTAKE SUMMARY Hx of Hypertension  [name] presents today New OB Nurse Interview.  OB History    Gravida  2   Para  1   Term  1   Preterm      AB      Living  1     SAB      TAB      Ectopic      Multiple      Live Births  1          I have reviewed the patient's medical, obstetrical, social, and family histories, medications, and available lab results.  SUBJECTIVE She complains of headache, nausea with vomiting for 10 days and dizziness. Pt states that she has not been able to take labetalol in 3 weeks because she keeps throwing it up, phenergan did not help. BP in office today 150/102, pulse 95. Advised pt to go to hospital, pt agreed.   Observations/Objective: Initial nurse interview for history (New OB)  EDD: 08-16-2019 GA: [redacted]w[redacted]d GP: G2P1   GENERAL APPEARANCE: Pt is alert and oriented, but expresses that she does not feel good, complains of feeling lightheaded and having a headache.   Assessment and Plan: Pt will be evaluated at the hospital and will follow up at NOB visit on 01-23-19. BP cuff rx faxed, cuff to be mailed to patient.  Follow Up Instructions:   I discussed the assessment and treatment plan with the patient. The patient was provided an opportunity to ask questions and all were answered. The patient agreed with the plan and demonstrated an understanding of the instructions.   The patient was advised to call back or seek an in-person evaluation if the symptoms worsen or if the condition fails to improve as anticipated.    Hinton Lovely, RN

## 2019-01-07 NOTE — Discharge Instructions (Signed)
Hyperemesis Gravidarum Hyperemesis gravidarum is a severe form of nausea and vomiting that happens during pregnancy. Hyperemesis is worse than morning sickness. It may cause you to have nausea or vomiting all day for many days. It may keep you from eating and drinking enough food and liquids, which can lead to dehydration, malnutrition, and weight loss. Hyperemesis usually occurs during the first half (the first 20 weeks) of pregnancy. It often goes away once a woman is in her second half of pregnancy. However, sometimes hyperemesis continues through an entire pregnancy. What are the causes? The cause of this condition is not known. It may be related to changes in chemicals (hormones) in the body during pregnancy, such as the high level of pregnancy hormone (human chorionic gonadotropin) or the increase in the female sex hormone (estrogen). What are the signs or symptoms? Symptoms of this condition include:  Nausea that does not go away.  Vomiting that does not allow you to keep any food down.  Weight loss.  Body fluid loss (dehydration).  Having no desire to eat, or not liking food that you have previously enjoyed. How is this diagnosed? This condition may be diagnosed based on:  A physical exam.  Your medical history.  Your symptoms.  Blood tests.  Urine tests. How is this treated? This condition is managed by controlling symptoms. This may include:  Following an eating plan. This can help lessen nausea and vomiting.  Taking prescription medicines. An eating plan and medicines are often used together to help control symptoms. If medicines do not help relieve nausea and vomiting, you may need to receive fluids through an IV at the hospital. Follow these instructions at home: Eating and drinking   Avoid the following: ? Drinking fluids with meals. Try not to drink anything during the 30 minutes before and after your meals. ? Drinking more than 1 cup of fluid at a  time. ? Eating foods that trigger your symptoms. These may include spicy foods, coffee, high-fat foods, very sweet foods, and acidic foods. ? Skipping meals. Nausea can be more intense on an empty stomach. If you cannot tolerate food, do not force it. Try sucking on ice chips or other frozen items and make up for missed calories later. ? Lying down within 2 hours after eating. ? Being exposed to environmental triggers. These may include food smells, smoky rooms, closed spaces, rooms with strong smells, warm or humid places, overly loud and noisy rooms, and rooms with motion or flickering lights. Try eating meals in a well-ventilated area that is free of strong smells. ? Quick and sudden changes in your movement. ? Taking iron pills and multivitamins that contain iron. If you take prescription iron pills, do not stop taking them unless your health care provider approves. ? Preparing food. The smell of food can spoil your appetite or trigger nausea.  To help relieve your symptoms: ? Listen to your body. Everyone is different and has different preferences. Find what works best for you. ? Eat and drink slowly. ? Eat 5-6 small meals daily instead of 3 large meals. Eating small meals and snacks can help you avoid an empty stomach. ? In the morning, before getting out of bed, eat a couple of crackers to avoid moving around on an empty stomach. ? Try eating starchy foods as these are usually tolerated well. Examples include cereal, toast, bread, potatoes, pasta, rice, and pretzels. ? Include at least 1 serving of protein with your meals and snacks. Protein options include  lean meats, poultry, seafood, beans, nuts, nut butters, eggs, cheese, and yogurt. ? Try eating a protein-rich snack before bed. Examples of a protein-rick snack include cheese and crackers or a peanut butter sandwich made with 1 slice of whole-wheat bread and 1 tsp (5 g) of peanut butter. ? Eat or suck on things that have ginger in them.  It may help relieve nausea. Add  tsp ground ginger to hot tea or choose ginger tea. ? Try drinking 100% fruit juice or an electrolyte drink. An electrolyte drink contains sodium, potassium, and chloride. ? Drink fluids that are cold, clear, and carbonated or sour. Examples include lemonade, ginger ale, lemon-lime soda, ice water, and sparkling water. ? Brush your teeth or use a mouth rinse after meals. ? Talk with your health care provider about starting a supplement of vitamin B6. General instructions  Take over-the-counter and prescription medicines only as told by your health care provider.  Follow instructions from your health care provider about eating or drinking restrictions.  Continue to take your prenatal vitamins as told by your health care provider. If you are having trouble taking your prenatal vitamins, talk with your health care provider about different options.  Keep all follow-up and pre-birth (prenatal) visits as told by your health care provider. This is important. Contact a health care provider if:  You have pain in your abdomen.  You have a severe headache.  You have vision problems.  You are losing weight.  You feel weak or dizzy. Get help right away if:  You cannot drink fluids without vomiting.  You vomit blood.  You have constant nausea and vomiting.  You are very weak.  You faint.  You have a fever and your symptoms suddenly get worse. Summary  Hyperemesis gravidarum is a severe form of nausea and vomiting that happens during pregnancy.  Making some changes to your eating habits may help relieve nausea and vomiting.  This condition may be managed with medicine.  If medicines do not help relieve nausea and vomiting, you may need to receive fluids through an IV at the hospital. This information is not intended to replace advice given to you by your health care provider. Make sure you discuss any questions you have with your health care  provider. Document Released: 05/29/2005 Document Revised: 06/18/2017 Document Reviewed: 01/26/2016 Elsevier Patient Education  [place]. Morning Sickness  Morning sickness is when a woman feels nauseous during pregnancy. This nauseous feeling may or may not come with vomiting. It often occurs in the morning, but it can be a problem at any time of day. Morning sickness is most common during the first trimester. In some cases, it may continue throughout pregnancy. Although morning sickness is unpleasant, it is usually harmless unless the woman develops severe and continual vomiting (hyperemesis gravidarum), a condition that requires more intense treatment. What are the causes? The exact cause of this condition is not known, but it seems to be related to normal hormonal changes that occur in pregnancy. What increases the risk? You are more likely to develop this condition if:  You experienced nausea or vomiting before your pregnancy.  You had morning sickness during a previous pregnancy.  You are pregnant with more than one baby, such as twins. What are the signs or symptoms? Symptoms of this condition include:  Nausea.  Vomiting. How is this diagnosed? This condition is usually diagnosed based on your signs and symptoms. How is this treated? In many cases, treatment is not needed  for this condition. Making some changes to what you eat may help to control symptoms. Your health care provider may also prescribe or recommend:  Vitamin B6 supplements.  Anti-nausea medicines.  Ginger. Follow these instructions at home: Medicines  Take over-the-counter and prescription medicines only as told by your health care provider. Do not use any prescription, over-the-counter, or herbal medicines for morning sickness without first talking with your health care provider.  Taking multivitamins before getting pregnant can prevent or decrease the severity of morning sickness in most  women. Eating and drinking  Eat a piece of dry toast or crackers before getting out of bed in the morning.  Eat 5 or 6 small meals a day.  Eat dry and bland foods, such as rice or a baked potato. Foods that are high in carbohydrates are often helpful.  Avoid greasy, fatty, and spicy foods.  Have someone cook for you if the smell of any food causes nausea and vomiting.  If you feel nauseous after taking prenatal vitamins, take the vitamins at night or with a snack.  Snack on protein foods between meals if you are hungry. Nuts, yogurt, and cheese are good options.  Drink fluids throughout the day.  Try ginger ale made with real ginger, ginger tea made from fresh grated ginger, or ginger candies. General instructions  Do not use any products that contain nicotine or tobacco, such as cigarettes and e-cigarettes. If you need help quitting, ask your health care provider.  Get an air purifier to keep the air in your house free of odors.  Get plenty of fresh air.  Try to avoid odors that trigger your nausea.  Consider trying these methods to help relieve symptoms: ? Wearing an acupressure wristband. These wristbands are often worn for seasickness. ? Acupuncture. Contact a health care provider if:  Your home remedies are not working and you need medicine.  You feel dizzy or light-headed.  You are losing weight. Get help right away if:  You have persistent and uncontrolled nausea and vomiting.  You faint.  You have severe pain in your abdomen. Summary  Morning sickness is when a woman feels nauseous during pregnancy. This nauseous feeling may or may not come with vomiting.  Morning sickness is most common during the first trimester.  It often occurs in the morning, but it can be a problem at any time of day.  In many cases, treatment is not needed for this condition. Making some changes to what you eat may help to control symptoms. This information is not intended to  replace advice given to you by your health care provider. Make sure you discuss any questions you have with your health care provider. Document Released: 07/20/2006 Document Revised: 05/11/2017 Document Reviewed: 07/01/2016 Elsevier Patient Education  2020 [employer].

## 2019-01-07 NOTE — [facility] Note (Signed)
.   Terri [name] is a 33 y.o. at [redacted]w[redacted]d here in [facility] reporting: dizziness, headache  And vomiting since yesterday.  Onset of complaint: 01/06/19 Pain score: 8 Vitals:   01/07/19 0959 01/07/19 1001  BP: (!) 160/106 (!) 156/103  Pulse: 85   Temp: 98.5 F (36.9 C)   SpO2: 100%      FHT: Lab orders placed from triage: [facility]
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
## 2019-01-10 LAB — CULTURE, OB URINE: Culture: >=100000 — AB

## 2019-01-16 ENCOUNTER — Other Ambulatory Visit: Payer: Self-pay | Admitting: Obstetrics

## 2019-01-16 ENCOUNTER — Telehealth: Payer: Self-pay | Admitting: Obstetrics

## 2019-01-16 DIAGNOSIS — O219 Vomiting of pregnancy, unspecified: Secondary | ICD-10-CM

## 2019-01-16 MED ORDER — DOXYLAMINE-PYRIDOXINE 10-10 MG PO TBEC: DELAYED_RELEASE_TABLET | ORAL | 5 refills | Status: DC

## 2019-01-16 NOTE — Telephone Encounter (Signed)
Patient called to check on her BP cuff, she has not received it yet.  I am following up with our DME supplier to inquire on this order.   Patient states she is still experiencing a lot of nausea and vomiting.  Dr. Jodi Mourning has sent in a Diclegis Rx for her to start.   Encouraged to try as best she can to take some food/crackers with her medication.

## 2019-01-23 ENCOUNTER — Other Ambulatory Visit: Payer: Self-pay

## 2019-01-23 ENCOUNTER — Ambulatory Visit (INDEPENDENT_AMBULATORY_CARE_PROVIDER_SITE_OTHER): Payer: Medicaid Other | Admitting: Obstetrics

## 2019-01-23 ENCOUNTER — Encounter: Payer: Self-pay | Admitting: Certified Nurse Midwife

## 2019-01-23 ENCOUNTER — Other Ambulatory Visit (HOSPITAL_COMMUNITY)
Admission: RE | Admit: 2019-01-23 | Discharge: 2019-01-23 | Disposition: A | Discharge status: Home / Self Care | Payer: Medicaid Other | Source: Ambulatory Visit | Attending: Obstetrics | Admitting: Obstetrics

## 2019-01-23 VITALS — BP 144/94 | HR 96 | Temp 98.9°F | Wt 234.9 lb

## 2019-01-23 DIAGNOSIS — O10919 Unspecified pre-existing hypertension complicating pregnancy, unspecified trimester: Secondary | ICD-10-CM

## 2019-01-23 DIAGNOSIS — Z3A11 11 weeks gestation of pregnancy: Secondary | ICD-10-CM

## 2019-01-23 DIAGNOSIS — O09899 Supervision of other high risk pregnancies, unspecified trimester: Secondary | ICD-10-CM

## 2019-01-23 DIAGNOSIS — Z3481 Encounter for supervision of other normal pregnancy, first trimester: Secondary | ICD-10-CM

## 2019-01-23 DIAGNOSIS — O10911 Unspecified pre-existing hypertension complicating pregnancy, first trimester: Secondary | ICD-10-CM

## 2019-01-23 DIAGNOSIS — O09891 Supervision of other high risk pregnancies, first trimester: Secondary | ICD-10-CM

## 2019-01-23 MED ORDER — BLOOD PRESSURE MONITOR KIT: 1.0000 | PACK | 0 refills | Status: DC

## 2019-01-23 NOTE — Progress Notes (Signed)
Subjective:    Terri [name] is being seen today for her first obstetrical visit.  This is a planned pregnancy. She is at 72w3dgestation. Her obstetrical history is significant for obesity and chronic hypertension. Relationship with FOB: significant other, not living together. Patient does intend to breast feed. Pregnancy history fully reviewed.  The information documented in the HPI was reviewed and verified.  Menstrual History: OB History    Gravida  2   Para  1   Term  1   Preterm      AB      Living  1     SAB      TAB      Ectopic      Multiple      Live Births  1            Patient's last menstrual period was 11/09/2018 (exact date).    Past Medical History:  Diagnosis Date  . Asthma   . Hypertension   . Hypertention, malignant, with acute intensive management     History reviewed. No pertinent surgical history.  (Not in a hospital admission)  Allergies  Allergen Reactions  . Vicodin [Hydrocodone-Acetaminophen] Itching    Social History   Tobacco Use  . Smoking status: Former Smoker    Packs/day: 0.20    Types: Cigarettes    Quit date: 11/24/2018    Years since quitting: 0.1  . 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 Alcohol use: Not Currently    Comment: 11/2018    Family History  Problem Relation Age of Onset  . Hypertension Mother   . Cancer Maternal Grandfather   . Cancer Paternal Grandfather      Review of Systems Constitutional: negative for weight loss Gastrointestinal: negative for vomiting Genitourinary:negative for genital lesions and vaginal discharge and dysuria Musculoskeletal:negative for back pain Behavioral/Psych: negative for abusive relationship, depression, illegal drug usage and tobacco use    Objective:    BP (!) 144/94   Pulse 96   Temp 98.9 F (37.2 C)   Wt 234 lb 14.4 oz (106.5 kg)   LMP 11/09/2018 (Exact Date)   BMI 35.72 kg/m  General Appearance:    Alert,  cooperative, no distress, appears stated age  Head:    Normocephalic, without obvious abnormality, atraumatic  Eyes:    PERRL, conjunctiva/corneas clear, EOM's intact, fundi    benign, both eyes  Ears:    Normal TM's and external ear canals, both ears  Nose:   Nares normal, septum midline, mucosa normal, no drainage    or sinus tenderness  Throat:   Lips, mucosa, and tongue normal; teeth and gums normal  Neck:   Supple, symmetrical, trachea midline, no adenopathy;    thyroid:  no enlargement/tenderness/nodules; no carotid   bruit or JVD  Back:     Symmetric, no curvature, ROM normal, no CVA tenderness  Lungs:     Clear to auscultation bilaterally, respirations unlabored  Chest Wall:    No tenderness or deformity   Heart:    Regular rate and rhythm, S1 and S2 normal, no murmur, rub   or gallop  Breast Exam:    No tenderness, masses, or nipple abnormality  Abdomen:     Soft, non-tender, bowel sounds active all four quadrants,    no masses, no organomegaly  Genitalia:    Normal female without lesion, discharge or tenderness  Extremities:   Extremities normal, atraumatic, no cyanosis  or edema  Pulses:   2+ and symmetric all extremities  Skin:   Skin color, texture, turgor normal, no rashes or lesions  Lymph nodes:   Cervical, supraclavicular, and axillary nodes normal  Neurologic:   CNII-XII intact, normal strength, sensation and reflexes    throughout      Lab Review Urine pregnancy test Labs reviewed yes Radiologic studies reviewed yes  Assessment:    Pregnancy at [redacted]w[redacted]d weeks    Plan:     1. Supervision of other high risk pregnancy, antepartum Rx: - Cytology - PAP( [place]) - Obstetric Panel, Including HIV - Cervicovaginal ancillary only( [place]) - Enroll Patient in Babyscripts - Babyscripts Schedule Optimization - Blood Pressure Monitor KIT; 1 kit by Does not apply route once a week. Check BP weekly.  Large Cuff DX: CHTN  Dispense: 1 kit; Refill: 0 - Urine  Culture  2. Chronic hypertension affecting pregnancy - start Baby ASA at 14-15 weeks  Prenatal vitamins.  Counseling provided regarding continued use of seat belts, cessation of alcohol consumption, smoking or use of illicit drugs; infection precautions i.e., influenza/TDAP immunizations, toxoplasmosis,CMV, parvovirus, listeria and varicella; workplace safety, exercise during pregnancy; routine dental care, safe medications, sexual activity, hot tubs, saunas, pools, travel, caffeine use, fish and methlymercury, potential toxins, hair treatments, varicose veins Weight gain recommendations per IOM guidelines reviewed: underweight/BMI< 18.5--> gain 28 - 40 lbs; normal weight/BMI 18.5 - 24.9--> gain 25 - 35 lbs; overweight/BMI 25 - 29.9--> gain 15 - 25 lbs; obese/BMI >30->gain  11 - 20 lbs Problem list reviewed and updated. FIRST/CF mutation testing/NIPT/QUAD SCREEN/fragile X/Ashkenazi Jewish population testing/Spinal muscular atrophy discussed: requested. Role of ultrasound in pregnancy discussed; fetal survey: requested. Amniocentesis discussed: not indicated.  Meds ordered this encounter  Medications  . Blood Pressure Monitor KIT    Sig: 1 kit by Does not apply route once a week. Check BP weekly.  Large Cuff DX: CHTN    Dispense:  1 kit    Refill:  0   Orders Placed This Encounter  Procedures  . Urine Culture  . Obstetric Panel, Including HIV    Follow up in 4 weeks. 50% of 25 min visit spent on counseling and coordination of care.    Harper, Charles A, MD 01/23/2019 11:32 AM 

## 2019-01-23 NOTE — Progress Notes (Signed)
New OB.  C/o cramping, NV she has Rx for Diclegis but she has not picked it up from the Pharmacy.

## 2019-01-24 LAB — OBSTETRIC PANEL, INCLUDING HIV
Antibody Screen: NEGATIVE
Basophils Absolute: 0 10*3/uL (ref 0.0–0.2)
Basos: 0 %
EOS (ABSOLUTE): 0 10*3/uL (ref 0.0–0.4)
Eos: 1 %
HIV Screen 4th Generation wRfx: NONREACTIVE
Hematocrit: 34.4 % (ref 34.0–46.6)
Hemoglobin: 11.5 g/dL (ref 11.1–15.9)
Hepatitis B Surface Ag: NEGATIVE
Immature Grans (Abs): 0 10*3/uL (ref 0.0–0.1)
Immature Granulocytes: 0 %
Lymphocytes Absolute: 1.4 10*3/uL (ref 0.7–3.1)
Lymphs: 27 %
MCH: 30 pg (ref 26.6–33.0)
MCHC: 33.4 g/dL (ref 31.5–35.7)
MCV: 90 fL (ref 79–97)
Monocytes Absolute: 0.3 10*3/uL (ref 0.1–0.9)
Monocytes: 6 %
Neutrophils Absolute: 3.4 10*3/uL (ref 1.4–7.0)
Neutrophils: 66 %
Platelets: 303 10*3/uL (ref 150–450)
RBC: 3.83 x10E6/uL (ref 3.77–5.28)
RDW: 12.2 % (ref 11.7–15.4)
RPR Ser Ql: NONREACTIVE
Rh Factor: POSITIVE
Rubella Antibodies, IGG: 1.06 index (ref >0.99)
WBC: 5.2 10*3/uL (ref 3.4–10.8)

## 2019-01-24 LAB — CYTOLOGY - PAP
Diagnosis: NEGATIVE
HPV: NOT DETECTED

## 2019-01-25 LAB — CERVICOVAGINAL ANCILLARY ONLY
Bacterial vaginitis: POSITIVE — AB
Candida vaginitis: NEGATIVE
Chlamydia: NEGATIVE
Neisseria Gonorrhea: NEGATIVE
Trichomonas: NEGATIVE

## 2019-01-25 LAB — URINE CULTURE: Organism ID, Bacteria: NO GROWTH

## 2019-01-27 ENCOUNTER — Other Ambulatory Visit: Payer: Self-pay | Admitting: Obstetrics

## 2019-01-27 DIAGNOSIS — B9689 Other specified bacterial agents as the cause of diseases classified elsewhere: Secondary | ICD-10-CM

## 2019-01-27 MED ORDER — METRONIDAZOLE 0.75 % VA GEL: 1.0000 | Freq: Every day | VAGINAL | 2 refills | Status: DC

## 2019-01-28 DIAGNOSIS — O09899 Supervision of other high risk pregnancies, unspecified trimester: Secondary | ICD-10-CM | POA: Diagnosis not present

## 2019-01-31 ENCOUNTER — Encounter: Payer: Self-pay | Admitting: Obstetrics and Gynecology

## 2019-01-31 DIAGNOSIS — O10919 Unspecified pre-existing hypertension complicating pregnancy, unspecified trimester: Secondary | ICD-10-CM | POA: Insufficient documentation

## 2019-01-31 DIAGNOSIS — O119 Pre-existing hypertension with pre-eclampsia, unspecified trimester: Secondary | ICD-10-CM

## 2019-01-31 HISTORY — DX: Pre-existing hypertension with pre-eclampsia, unspecified trimester: O11.9

## 2019-02-04 ENCOUNTER — Other Ambulatory Visit: Payer: Self-pay | Admitting: *Deleted

## 2019-02-04 MED ORDER — TERCONAZOLE 0.4 % VA CREA: 1.0000 | TOPICAL_CREAM | Freq: Every day | VAGINAL | 0 refills | Status: DC

## 2019-02-04 NOTE — Progress Notes (Signed)
Pt called to office stating she is having thick white clumpy discharge after using Metrogel.  Pt made aware that Metrogel may continue to be discharged after use and may change in appearance.  Pt was made aware that Terazol cream can be sent today and if she develops other symptoms of yeast, she may use as directed. Pt states she has an appt on Thursday and if any other problems, will discuss at that time.

## 2019-02-06 ENCOUNTER — Other Ambulatory Visit: Payer: Medicaid Other

## 2019-02-06 ENCOUNTER — Other Ambulatory Visit: Payer: Self-pay

## 2019-02-06 DIAGNOSIS — Z3482 Encounter for supervision of other normal pregnancy, second trimester: Secondary | ICD-10-CM | POA: Diagnosis not present

## 2019-02-13 ENCOUNTER — Encounter (HOSPITAL_COMMUNITY): Payer: Self-pay

## 2019-02-13 ENCOUNTER — Inpatient Hospital Stay (HOSPITAL_COMMUNITY)
Admission: AD | Admit: 2019-02-13 | Discharge: 2019-02-13 | Disposition: A | Discharge status: Home / Self Care | Payer: Medicaid Other | Attending: Family Medicine | Admitting: Family Medicine

## 2019-02-13 ENCOUNTER — Other Ambulatory Visit: Payer: Self-pay

## 2019-02-13 DIAGNOSIS — J45909 Unspecified asthma, uncomplicated: Secondary | ICD-10-CM | POA: Diagnosis not present

## 2019-02-13 DIAGNOSIS — Z560 Unemployment, unspecified: Secondary | ICD-10-CM | POA: Diagnosis not present

## 2019-02-13 DIAGNOSIS — O26892 Other specified pregnancy related conditions, second trimester: Secondary | ICD-10-CM | POA: Diagnosis not present

## 2019-02-13 DIAGNOSIS — Z3A14 14 weeks gestation of pregnancy: Secondary | ICD-10-CM | POA: Diagnosis not present

## 2019-02-13 DIAGNOSIS — Z87891 Personal history of nicotine dependence: Secondary | ICD-10-CM | POA: Insufficient documentation

## 2019-02-13 DIAGNOSIS — O162 Unspecified maternal hypertension, second trimester: Secondary | ICD-10-CM | POA: Diagnosis not present

## 2019-02-13 DIAGNOSIS — O10919 Unspecified pre-existing hypertension complicating pregnancy, unspecified trimester: Secondary | ICD-10-CM

## 2019-02-13 DIAGNOSIS — O99512 Diseases of the respiratory system complicating pregnancy, second trimester: Secondary | ICD-10-CM | POA: Insufficient documentation

## 2019-02-13 DIAGNOSIS — K5900 Constipation, unspecified: Secondary | ICD-10-CM | POA: Diagnosis not present

## 2019-02-13 DIAGNOSIS — O99612 Diseases of the digestive system complicating pregnancy, second trimester: Secondary | ICD-10-CM | POA: Diagnosis not present

## 2019-02-13 DIAGNOSIS — G43709 Chronic migraine without aura, not intractable, without status migrainosus: Secondary | ICD-10-CM | POA: Diagnosis not present

## 2019-02-13 DIAGNOSIS — O10912 Unspecified pre-existing hypertension complicating pregnancy, second trimester: Secondary | ICD-10-CM | POA: Diagnosis not present

## 2019-02-13 DIAGNOSIS — Z79899 Other long term (current) drug therapy: Secondary | ICD-10-CM | POA: Diagnosis not present

## 2019-02-13 DIAGNOSIS — O99352 Diseases of the nervous system complicating pregnancy, second trimester: Secondary | ICD-10-CM | POA: Insufficient documentation

## 2019-02-13 DIAGNOSIS — R109 Unspecified abdominal pain: Secondary | ICD-10-CM | POA: Diagnosis not present

## 2019-02-13 LAB — URINALYSIS, ROUTINE W REFLEX MICROSCOPIC
Bilirubin Urine: NEGATIVE
Glucose, UA: NEGATIVE mg/dL
Hgb urine dipstick: NEGATIVE
Ketones, ur: NEGATIVE mg/dL
Leukocytes,Ua: NEGATIVE
Nitrite: NEGATIVE
Protein, ur: 30 mg/dL — AB
Specific Gravity, Urine: 1.028 (ref 1.005–1.030)
pH: 6 (ref 5.0–8.0)

## 2019-02-13 LAB — WET PREP, GENITAL
Sperm: NONE SEEN
Trich, Wet Prep: NONE SEEN
Yeast Wet Prep HPF POC: NONE SEEN

## 2019-02-13 MED ORDER — METRONIDAZOLE 500 MG PO TABS: 500.0000 mg | ORAL_TABLET | Freq: Two times a day (BID) | ORAL | 0 refills | Status: AC

## 2019-02-13 MED ORDER — DEXAMETHASONE 6 MG PO TABS
10.0000 mg | ORAL_TABLET | Freq: Once | ORAL | Status: AC
  Administered 2019-02-13: 10 mg via ORAL
  Filled 2019-02-13: qty 1

## 2019-02-13 MED ORDER — METOCLOPRAMIDE HCL 10 MG PO TABS
10.0000 mg | ORAL_TABLET | Freq: Once | ORAL | Status: AC
  Administered 2019-02-13: 10 mg via ORAL
  Filled 2019-02-13: qty 1

## 2019-02-13 MED ORDER — DOCUSATE SODIUM 100 MG PO CAPS: 100.0000 mg | ORAL_CAPSULE | Freq: Two times a day (BID) | ORAL | 2 refills | Status: DC | PRN

## 2019-02-13 MED ORDER — METOCLOPRAMIDE HCL 10 MG PO TABS: 10.0000 mg | ORAL_TABLET | Freq: Four times a day (QID) | ORAL | 1 refills | Status: DC | PRN

## 2019-02-13 NOTE — MAU Provider Note (Signed)
Chief Complaint: Headache, Hypertension, and Abdominal Pain   First Provider Initiated Contact with Patient 02/13/19 1518      SUBJECTIVE HPI: Terri [name] is a 33 y.o. G2P1001 at 54w3dby LMP with CHTN on labetalol who presents to maternity admissions reporting frequent migraines and new onset abdominal pain in the last 3-4 days.  She has hx of migraines prior to pregnancy and these are similar but worse than usual.  They start as sharp pain on the left side, temporal, then radiate across to the right side of her head. There is light sensitivity and nausea associated with the h/a's.  Her abdominal pain is pressure in the front lower part of her abdomen, constant, but waxes and wanes in intensity and is worse when bending over. She reports some recent constipation with bowel movement today but it was uncomfortable/hard stool.  She tried Tylenol 500 mg x 1 this week but it did not help. She has not taken any medications today. There are no other symptoms.    HPI  Past Medical History:  Diagnosis Date  . Asthma   . Hypertension   . Hypertention, malignant, with acute intensive management    History reviewed. No pertinent surgical history. Social History   Socioeconomic History  . Marital status: Single    Spouse name: Not on file  . Number of children: Not on file  . Years of education: Not on file  . Highest education level: Not on file  Occupational History  . Occupation: Unemployed  Social Needs  . Financial resource strain: Not on file  . Food insecurity    Worry: Not on file    Inability: Not on file  . Transportation needs    Medical: Not on file    Non-medical: Not on file  Tobacco Use  . Smoking status: Former Smoker    Packs/day: 0.20    Types: Cigarettes    Quit date: 11/24/2018    Years since quitting: 0.2  . Smokeless tobacco: Former [employer] developer   Types: Snuff  Substance and Sexual Activity  . Alcohol use: Not Currently    Comment: 11/2018  . Drug use: Not  Currently    Types: Marijuana    Comment: 12/11/2018; previously used  . Sexual activity: Yes    Birth control/protection: None  Lifestyle  . Physical activity    Days per week: Not on file    Minutes per session: Not on file  . Stress: Not on file  Relationships  . Social [place] phone: Not on file    Gets together: Not on file    Attends religious service: Not on file    Active member of club or organization: Not on file    Attends meetings of clubs or organizations: Not on file    Relationship status: Not on file  . Intimate partner violence    Fear of current or ex partner: No    Emotionally abused: No    Physically abused: No    Forced sexual activity: No  Other Topics Concern  . Not on file  Social History Narrative   ** Merged History Encounter **       No current facility-administered medications on file prior to encounter.    Current Outpatient Medications on File Prior to Encounter  Medication Sig Dispense Refill  . labetalol (NORMODYNE) 200 MG tablet Take 2 tablets (400 mg total) by mouth 2 (two) times daily. 60 tablet 2  . Prenatal  MV-Min-FA-Omega-3 (PRENATAL GUMMIES/DHA & FA) 0.4-32.5 MG CHEW Chew by mouth.    . Blood Pressure Monitor KIT 1 kit by Does not apply route once a week. Check BP weekly.  Large Cuff DX: CHTN 1 kit 0  . Blood Pressure Monitoring KIT 1 kit by Does not apply route once a week. 1 kit 0  . Doxylamine-Pyridoxine (DICLEGIS) 10-10 MG TBEC 1 tab in AM, 1 tab mid afternoon 2 tabs at bedtime. Max dose 4 tabs daily. (Patient not taking: Reported on 01/23/2019) 100 tablet 5  . famotidine (PEPCID) 20 MG tablet Take 1 tablet (20 mg total) by mouth every evening. (Patient not taking: Reported on 01/23/2019) 30 tablet 1  . glycopyrrolate (ROBINUL) 1 MG tablet Take 1 tablet (1 mg total) by mouth 3 (three) times daily. 90 tablet 0  . metroNIDAZOLE (METROGEL) 0.75 % vaginal gel Place 1 Applicatorful vaginally at bedtime. Apply one applicatorful  to vagina at bedtime for 5 days 70 g 2  . promethazine (PHENERGAN) 25 MG tablet Take 1 tablet (25 mg total) by mouth every 6 (six) hours as needed for nausea or vomiting. (Patient not taking: Reported on 01/07/2019) 30 tablet 0  . terconazole (TERAZOL 7) 0.4 % vaginal cream Place 1 applicator vaginally at bedtime. 45 g 0  . [DISCONTINUED] hydrochlorothiazide (HYDRODIURIL) 25 MG tablet Take 1 tablet (25 mg total) by mouth daily. 90 tablet 3   Allergies  Allergen Reactions  . Vicodin [Hydrocodone-Acetaminophen] Itching    ROS:  Review of Systems  Constitutional: Negative for chills, fatigue and fever.  Eyes: Positive for visual disturbance.  Respiratory: Negative for shortness of breath.   Cardiovascular: Negative for chest pain.  Gastrointestinal: Positive for abdominal pain, constipation and nausea. Negative for vomiting.  Genitourinary: Negative for difficulty urinating, dysuria, flank pain, pelvic pain, vaginal bleeding, vaginal discharge and vaginal pain.  Neurological: Positive for headaches. Negative for dizziness.  Psychiatric/Behavioral: Negative.      I have reviewed patient's Past Medical Hx, Surgical Hx, Family Hx, Social Hx, medications and allergies.   Physical Exam   Patient Vitals for the past 24 hrs:  BP Temp Temp src Pulse Resp SpO2 Weight  02/13/19 1438 139/85 98 F (36.7 C) Skin 91 18 99 % 104.4 kg   Constitutional: Well-developed, well-nourished female in no acute distress.  Cardiovascular: normal rate Respiratory: normal effort GI: Abd soft, non-tender. Pos BS x 4 MS: Extremities nontender, no edema, normal ROM Neurological - alert, oriented, normal speech, no focal findings or movement disorder noted, screening mental status exam normal, cranial nerves II through XII intact, DTR's normal and symmetric, motor and sensory grossly normal bilaterally, normal muscle tone, no tremors, strength 5/5  GU: Neg CVAT.  PELVIC EXAM: Wet prep/GCC collected by blind  swab  FHT 159 by doppler  LAB RESULTS Results for orders placed or performed during the hospital encounter of 02/13/19 (from the past 24 hour(s))  Urinalysis, Routine w reflex microscopic     Status: Abnormal   Collection Time: 02/13/19  3:03 PM  Result Value Ref Range   Color, Urine YELLOW YELLOW   APPearance HAZY (A) CLEAR   Specific Gravity, Urine 1.028 1.005 - 1.030   pH 6.0 5.0 - 8.0   Glucose, UA NEGATIVE NEGATIVE mg/dL   Hgb urine dipstick NEGATIVE NEGATIVE   Bilirubin Urine NEGATIVE NEGATIVE   Ketones, ur NEGATIVE NEGATIVE mg/dL   Protein, ur 30 (A) NEGATIVE mg/dL   Nitrite NEGATIVE NEGATIVE   Leukocytes,Ua NEGATIVE NEGATIVE   WBC, UA  0-5 0 - 5 WBC/hpf   Bacteria, UA RARE (A) NONE SEEN   Squamous Epithelial / LPF 0-5 0 - 5   Mucus PRESENT    Ca Oxalate Crys, UA PRESENT   Wet prep, genital     Status: Abnormal   Collection Time: 02/13/19  3:42 PM   Specimen: Vaginal; Genital  Result Value Ref Range   Yeast Wet Prep HPF POC NONE SEEN NONE SEEN   Trich, Wet Prep NONE SEEN NONE SEEN   Clue Cells Wet Prep HPF POC PRESENT (A) NONE SEEN   WBC, Wet Prep HPF POC FEW (A) NONE SEEN   Sperm NONE SEEN     O/Positive/-- (08/13 1132)  IMAGING No results found.  MAU Management/MDM: Orders Placed This Encounter  Procedures  . Wet prep, genital  . Urinalysis, Routine w reflex microscopic  . Discharge patient    Meds ordered this encounter  Medications  . metoCLOPramide (REGLAN) tablet 10 mg  . dexamethasone (DECADRON) tablet 10 mg  . metoCLOPramide (REGLAN) 10 MG tablet    Sig: Take 1 tablet (10 mg total) by mouth every 6 (six) hours as needed for nausea (Or headache).    Dispense:  30 tablet    Refill:  1    Order Specific Question:   Supervising Provider    Answer:   Donnamae Jude [[id number]]  . docusate sodium (COLACE) 100 MG capsule    Sig: Take 1 capsule (100 mg total) by mouth 2 (two) times daily as needed.    Dispense:  30 capsule    Refill:  2    Order Specific  Question:   Supervising Provider    Answer:   Donnamae Jude [[id number]]  . metroNIDAZOLE (FLAGYL) 500 MG tablet    Sig: Take 1 tablet (500 mg total) by mouth 2 (two) times daily for 7 days.    Dispense:  14 tablet    Refill:  0    Order Specific Question:   Supervising Provider    Answer:   Donnamae Jude [[id number]]    Neuro exam wnl, pt with hx of migraines and these recent h/a are consistent with migraine.  Pt declines migraine cocktail with IV fluids in MAU and she is tolerated PO fluids well so PO Reglan and Decadron given and pt to drink more water.  Headache improved significantly after medications in MAU.  BP wnl in MAU, no changes to labetalol regimen at this time.    Cervix 0/thick/high with no evidence of labor.  Stool palpable on cervical exam so constipation likely contributing to abdominal pain.  Discussed high fiber diet, increased PO fluids, and Rx for Colace sent.    Wet prep with clue cells, isolated finding but pt with abdominal pain. Discussed with pt and Rx for Flagyl sent. Pt to treat constipation and if abdominal cramping persists, will start Flagyl course.  Pt discharged with strict return precautions.  ASSESSMENT 1. Chronic hypertension affecting pregnancy   2. Chronic migraine without aura without status migrainosus, not intractable   3. Abdominal pain during pregnancy in second trimester   4. Constipation, unspecified constipation type     PLAN Discharge home Allergies as of 02/13/2019      Reactions   Vicodin [hydrocodone-acetaminophen] Itching      Medication List    STOP taking these medications   Doxylamine-Pyridoxine 10-10 MG Tbec Commonly known as: Diclegis   famotidine 20 MG tablet Commonly known as: PEPCID   glycopyrrolate 1 MG tablet Commonly  known as: Robinul   metroNIDAZOLE 0.75 % vaginal gel Commonly known as: METROGEL   terconazole 0.4 % vaginal cream Commonly known as: TERAZOL 7     TAKE these medications   Blood Pressure Monitoring  Kit 1 kit by Does not apply route once a week.   Blood Pressure Monitor Kit 1 kit by Does not apply route once a week. Check BP weekly.  Large Cuff DX: CHTN   docusate sodium 100 MG capsule Commonly known as: COLACE Take 1 capsule (100 mg total) by mouth 2 (two) times daily as needed.   labetalol 200 MG tablet Commonly known as: NORMODYNE Take 2 tablets (400 mg total) by mouth 2 (two) times daily.   metoCLOPramide 10 MG tablet Commonly known as: REGLAN Take 1 tablet (10 mg total) by mouth every 6 (six) hours as needed for nausea (Or headache).   metroNIDAZOLE 500 MG tablet Commonly known as: FLAGYL Take 1 tablet (500 mg total) by mouth 2 (two) times daily for 7 days.   Prenatal Gummies/DHA & FA 0.4-32.5 MG Chew Chew by mouth.   promethazine 25 MG tablet Commonly known as: PHENERGAN Take 1 tablet (25 mg total) by mouth every 6 (six) hours as needed for nausea or vomiting.      Follow-up Information    [facility] Follow up.   Why: As scheduled, return to MAU as needed for emergencies Contact information: [address] [place] [address] [place] Certified Nurse-Midwife 02/13/2019  5:08 PM

## 2019-02-13 NOTE — MAU Note (Signed)
Been having real high BP, is on meds and having migraines.  Called the nurse line and they told her if the migraines cont to come in to be seen.  Having sharp pains and nose bleeds. Did not take anything for HA. Having some cramping too.

## 2019-02-15 LAB — GC/CHLAMYDIA PROBE AMP (~~LOC~~) NOT AT ARMC
Chlamydia: NEGATIVE
Neisseria Gonorrhea: NEGATIVE

## 2019-02-18 ENCOUNTER — Encounter: Payer: Self-pay | Admitting: Obstetrics

## 2019-02-19 ENCOUNTER — Encounter: Payer: Self-pay | Admitting: Obstetrics

## 2019-02-20 ENCOUNTER — Other Ambulatory Visit: Payer: Self-pay

## 2019-02-20 ENCOUNTER — Encounter: Payer: Self-pay | Admitting: Obstetrics

## 2019-02-20 ENCOUNTER — Telehealth (INDEPENDENT_AMBULATORY_CARE_PROVIDER_SITE_OTHER): Payer: Medicaid Other | Admitting: Obstetrics

## 2019-02-20 DIAGNOSIS — O09899 Supervision of other high risk pregnancies, unspecified trimester: Secondary | ICD-10-CM

## 2019-02-20 DIAGNOSIS — O10912 Unspecified pre-existing hypertension complicating pregnancy, second trimester: Secondary | ICD-10-CM

## 2019-02-20 DIAGNOSIS — Z3A15 15 weeks gestation of pregnancy: Secondary | ICD-10-CM

## 2019-02-20 DIAGNOSIS — O09892 Supervision of other high risk pregnancies, second trimester: Secondary | ICD-10-CM

## 2019-02-20 DIAGNOSIS — G43709 Chronic migraine without aura, not intractable, without status migrainosus: Secondary | ICD-10-CM

## 2019-02-20 DIAGNOSIS — K5900 Constipation, unspecified: Secondary | ICD-10-CM

## 2019-02-20 DIAGNOSIS — O10919 Unspecified pre-existing hypertension complicating pregnancy, unspecified trimester: Secondary | ICD-10-CM

## 2019-02-20 MED ORDER — POLYETHYLENE GLYCOL 3350 17 G PO PACK: 17.0000 g | PACK | Freq: Every evening | ORAL | 5 refills | Status: DC | PRN

## 2019-02-20 MED ORDER — DOCUSATE SODIUM 100 MG PO CAPS: 100.0000 mg | ORAL_CAPSULE | Freq: Two times a day (BID) | ORAL | 5 refills | Status: DC | PRN

## 2019-02-20 NOTE — Progress Notes (Signed)
   TELEHEALTH OBSTETRICS PRENATAL VIRTUAL VIDEO VISIT ENCOUNTER NOTE  Provider location: [facility] [place]   I connected with Terri [name] on 02/20/19 at 10:45 AM EDT by OB MyChart Video Encounter at home and verified that I am speaking with the correct person using two identifiers.   I discussed the limitations, risks, security and privacy concerns of performing an evaluation and management service virtually and the availability of in person appointments. I also discussed with the patient that there may be a patient responsible charge related to this service. The patient expressed understanding and agreed to proceed. Subjective:  Terri [name] is a 33 y.o. G2P1001 at [redacted]w[redacted]d being seen today for ongoing prenatal care.  She is currently monitored for the following issues for this high-risk pregnancy and has Obesity; TOBACCO USER; Primary hypertension; ASTHMA, INTERMITTENT; Unspecified episodic mood disorder; Nephrolithiasis; Mixed incontinence; Migraine; Supervision of other high risk pregnancy, antepartum; and Chronic hypertension affecting pregnancy on their problem list.  Patient reports headache and constipation.  Contractions: Not present. Vag. Bleeding: None.  Movement: Present. Denies any leaking of fluid.   The following portions of the patient's history were reviewed and updated as appropriate: allergies, current medications, past family history, past medical history, past social history, past surgical history and problem list.   Objective:  There were no vitals filed for this visit.  Fetal Status:     Movement: Present     General:  Alert, oriented and cooperative. Patient is in no acute distress.  Respiratory: Normal respiratory effort, no problems with respiration noted  Mental Status: Normal mood and affect. Normal behavior. Normal judgment and thought content.  Rest of physical exam deferred due to type of encounter  Imaging: No results found.   Assessment and Plan:  Pregnancy: G2P1001 at [redacted]w[redacted]d 1. Supervision of other high risk pregnancy, antepartum Rx: - [place] MFM OB COMP + 14 WK; Future  2. Chronic hypertension affecting pregnancy - taking Labetalol 400mg  bid - BP clinically stable  3. Chronic migraine without aura without status migrainosus, not intractable - has referral to Neuro.  4. Constipation, unspecified constipation type Rx: - docusate sodium (COLACE) 100 MG capsule; Take 1 capsule (100 mg total) by mouth 2 (two) times daily as needed.  Dispense: 60 capsule; Refill: 5 - polyethylene glycol (MIRALAX) 17 g packet; Take 17 g by mouth at bedtime as needed for mild constipation or moderate constipation.  Dispense: 30 each; Refill: 5  Preterm labor symptoms and general obstetric precautions including but not limited to vaginal bleeding, contractions, leaking of fluid and fetal movement were reviewed in detail with the patient. I discussed the assessment and treatment plan with the patient. The patient was provided an opportunity to ask questions and all were answered. The patient agreed with the plan and demonstrated an understanding of the instructions. The patient was advised to call back or seek an in-person office evaluation/go to MAU at [facility] for any urgent or concerning symptoms. Please refer to After Visit Summary for other counseling recommendations.   I provided 15 minutes of face-to-face time during this encounter.  Return in about 4 weeks (around 03/20/2019) for MyChart.    Baltazar Najjar, MD [facility] 02/20/2019

## 2019-02-20 NOTE — Progress Notes (Signed)
Virtual ROB  CC: Pelvic Pressure and vaginal pressure had MAU visit. Pt was told discomfort could be  Constipation Rx was sent for HA's.

## 2019-03-13 ENCOUNTER — Telehealth: Payer: Self-pay

## 2019-03-13 NOTE — Telephone Encounter (Signed)
Patient called stating that she woke up with a nose bleed this am. She took a shower and checked her bp when she got out and it was 163/111. She took her labetalol after getting that reading which was about 15 minutes ago. She is currently laying down. She denies having any headaches, visual changes, or swelling, also denies having any upper abdominal pain. I instructed patient to take bp again while on the phone with me and it was 143/98. Discussed with Dr. Jodi Mourning who advise for patient to monitor bp today and if she continues to get elevated readings over 150/90 and/or start to develop any of the above sx that she needs to be evaluated. Pt verbalized understanding.

## 2019-03-17 ENCOUNTER — Other Ambulatory Visit: Payer: Self-pay | Admitting: Advanced Practice Midwife

## 2019-03-17 MED ORDER — LABETALOL HCL 200 MG PO TABS: 400.0000 mg | ORAL_TABLET | Freq: Two times a day (BID) | ORAL | 2 refills | Status: DC

## 2019-03-17 NOTE — Progress Notes (Signed)
Pt called to report she is out of labetalol Rx. Refill 400 mg BID today.

## 2019-03-19 ENCOUNTER — Other Ambulatory Visit: Payer: Self-pay

## 2019-03-19 ENCOUNTER — Ambulatory Visit (HOSPITAL_COMMUNITY)
Admission: RE | Admit: 2019-03-19 | Discharge: 2019-03-19 | Disposition: A | Discharge status: Home / Self Care | Payer: Medicaid Other | Source: Ambulatory Visit | Attending: Obstetrics and Gynecology | Admitting: Obstetrics and Gynecology

## 2019-03-19 ENCOUNTER — Other Ambulatory Visit: Payer: Self-pay | Admitting: Obstetrics

## 2019-03-19 ENCOUNTER — Other Ambulatory Visit (HOSPITAL_COMMUNITY): Payer: Self-pay | Admitting: *Deleted

## 2019-03-19 ENCOUNTER — Encounter (HOSPITAL_COMMUNITY): Payer: Self-pay

## 2019-03-19 ENCOUNTER — Ambulatory Visit (HOSPITAL_COMMUNITY): Payer: Medicaid Other | Admitting: *Deleted

## 2019-03-19 VITALS — BP 141/88 | HR 89 | Temp 98.6°F

## 2019-03-19 DIAGNOSIS — O99212 Obesity complicating pregnancy, second trimester: Secondary | ICD-10-CM

## 2019-03-19 DIAGNOSIS — O09899 Supervision of other high risk pregnancies, unspecified trimester: Secondary | ICD-10-CM | POA: Diagnosis not present

## 2019-03-19 DIAGNOSIS — O10919 Unspecified pre-existing hypertension complicating pregnancy, unspecified trimester: Secondary | ICD-10-CM | POA: Diagnosis not present

## 2019-03-19 DIAGNOSIS — O09892 Supervision of other high risk pregnancies, second trimester: Secondary | ICD-10-CM

## 2019-03-19 DIAGNOSIS — O3412 Maternal care for benign tumor of corpus uteri, second trimester: Secondary | ICD-10-CM

## 2019-03-19 DIAGNOSIS — Z3A18 18 weeks gestation of pregnancy: Secondary | ICD-10-CM

## 2019-03-19 DIAGNOSIS — D259 Leiomyoma of uterus, unspecified: Secondary | ICD-10-CM

## 2019-03-19 DIAGNOSIS — O99332 Smoking (tobacco) complicating pregnancy, second trimester: Secondary | ICD-10-CM

## 2019-03-19 DIAGNOSIS — O10912 Unspecified pre-existing hypertension complicating pregnancy, second trimester: Secondary | ICD-10-CM

## 2019-03-19 DIAGNOSIS — O10012 Pre-existing essential hypertension complicating pregnancy, second trimester: Secondary | ICD-10-CM

## 2019-03-20 ENCOUNTER — Encounter: Payer: Self-pay | Admitting: Obstetrics

## 2019-03-20 ENCOUNTER — Telehealth (INDEPENDENT_AMBULATORY_CARE_PROVIDER_SITE_OTHER): Payer: Medicaid Other | Admitting: Obstetrics

## 2019-03-20 DIAGNOSIS — O10912 Unspecified pre-existing hypertension complicating pregnancy, second trimester: Secondary | ICD-10-CM

## 2019-03-20 DIAGNOSIS — O10919 Unspecified pre-existing hypertension complicating pregnancy, unspecified trimester: Secondary | ICD-10-CM

## 2019-03-20 DIAGNOSIS — O339 Maternal care for disproportion, unspecified: Secondary | ICD-10-CM | POA: Diagnosis not present

## 2019-03-20 DIAGNOSIS — O09892 Supervision of other high risk pregnancies, second trimester: Secondary | ICD-10-CM

## 2019-03-20 DIAGNOSIS — O09899 Supervision of other high risk pregnancies, unspecified trimester: Secondary | ICD-10-CM

## 2019-03-20 DIAGNOSIS — M549 Dorsalgia, unspecified: Secondary | ICD-10-CM

## 2019-03-20 DIAGNOSIS — G43709 Chronic migraine without aura, not intractable, without status migrainosus: Secondary | ICD-10-CM

## 2019-03-20 DIAGNOSIS — Z3A19 19 weeks gestation of pregnancy: Secondary | ICD-10-CM

## 2019-03-20 MED ORDER — COMFORT FIT MATERNITY SUPP SM MISC: 0 refills | Status: DC

## 2019-03-20 NOTE — Progress Notes (Signed)
ROB Virtual Visit

## 2019-03-20 NOTE — Progress Notes (Signed)
TELEHEALTH OBSTETRICS PRENATAL VIRTUAL VIDEO VISIT ENCOUNTER NOTE  Provider location: [facility] [place]   I connected with Terri [name] on 03/20/19 at 10:00 AM EDT by OB MyChart Video Encounter at home and verified that I am speaking with the correct person using two identifiers.   I discussed the limitations, risks, security and privacy concerns of performing an evaluation and management service virtually and the availability of in person appointments. I also discussed with the patient that there may be a patient responsible charge related to this service. The patient expressed understanding and agreed to proceed. Subjective:  Terri [name] is a 33 y.o. G2P1001 at [redacted]w[redacted]d being seen today for ongoing prenatal care.  She is currently monitored for the following issues for this high-risk pregnancy and has Obesity; TOBACCO USER; Primary hypertension; ASTHMA, INTERMITTENT; Unspecified episodic mood disorder; Nephrolithiasis; Mixed incontinence; Migraine; Supervision of other high risk pregnancy, antepartum; and Chronic hypertension affecting pregnancy on their problem list.  Patient reports backache.  Contractions: Not present. Vag. Bleeding: None.  Movement: Present. Denies any leaking of fluid.   The following portions of the patient's history were reviewed and updated as appropriate: allergies, current medications, past family history, past medical history, past social history, past surgical history and problem list.   Objective:  There were no vitals filed for this visit.  Fetal Status:     Movement: Present     General:  Alert, oriented and cooperative. Patient is in no acute distress.  Respiratory: Normal respiratory effort, no problems with respiration noted  Mental Status: Normal mood and affect. Normal behavior. Normal judgment and thought content.  Rest of physical exam deferred due to type of encounter  Imaging: [place] Mfm Ob Detail +14 Wk  Result Date:  03/19/2019 ----------------------------------------------------------------------  OBSTETRICS REPORT                       (Signed Final 03/19/2019 11:29 am) ---------------------------------------------------------------------- Patient Info  ID #:       [id number]                          D.O.B.:  February 06, 1986 (32 yrs)  Name:       MOSELLE [name] [facility]                  Visit Date: 03/19/2019 08:56 am ---------------------------------------------------------------------- Performed By  Performed By:     Jeanene Erb BS,      Ref. Address:     [place], [place]  [place]  Attending:        Tama High MD        [facility]  Referred By:      Terri Bombard MD ---------------------------------------------------------------------- Orders   #  Description                          Code         Ordered By   1  [place] MFM OB DETAIL +14 [place]              76811.01     Baltazar Najjar  ----------------------------------------------------------------------   #  Order #                    Accession #                 Episode #   1  [id number]                  [id number]                  [id number]  ---------------------------------------------------------------------- Indications   Encounter for antenatal screening for          Z36.3   malformations (low risk NIPS)   Hypertension - Chronic/Pre-existing            O10.019   (labetalol)   Tobacco use complicating pregnancy,            O99.332   second trimester   Obesity complicating pregnancy, second         O99.212   trimester   Uterine fibroids affecting pregnancy in        O34.12, D25.9   second trimester, antepartum   [redacted] weeks gestation of pregnancy                 Z3A.18  ---------------------------------------------------------------------- Vital Signs  Weight (lb): 230                               Height:        5'8"  BMI:         34.97 ---------------------------------------------------------------------- Fetal Evaluation  Num Of Fetuses:         1  Fetal Heart Rate(bpm):  158  Cardiac Activity:       Observed  Presentation:           Breech  Placenta:               Posterior  P. Cord Insertion:      Visualized  Amniotic Fluid  AFI FV:      Within normal limits                              Largest Pocket(cm)  6.5 ---------------------------------------------------------------------- Biometry  BPD:      45.4  mm     G. Age:  19w 5d         91  %    CI:        73.77   %    70 - 86                                                          FL/HC:      17.7   %    16.1 - 18.3  HC:      167.9  mm     G. Age:  19w 3d         82  %    HC/AC:      1.13        1.09 - 1.39  AC:      148.4  mm     G. Age:  20w 1d         90  %    FL/BPD:     65.6   %  FL:       29.8  mm     G. Age:  19w 1d         67  %    FL/AC:      20.1   %    20 - 24  HUM:      28.5  mm     G. Age:  19w 1d         70  %  NFT:       3.5  mm  Est. FW:     306  gm    0 lb 11 oz      96  % ---------------------------------------------------------------------- OB History  Gravidity:    2         Term:   1        Prem:   0        SAB:   0  TOP:          0       Ectopic:  0        Living: 1 ---------------------------------------------------------------------- Gestational Age  LMP:           18w 4d        Date:  11/09/18                 EDD:   08/16/19  U/S Today:     19w 4d                                        EDD:   08/09/19  Best:          18w 4d     Det. By:  LMP  (11/09/18)          EDD:   08/16/19 ---------------------------------------------------------------------- Anatomy  Cranium:               Appears normal         LVOT:                   Not well visualized  Cavum:  Appears normal         Aortic Arch:            Appears normal  Ventricles:            Appears normal         Ductal Arch:            Appears normal  Choroid Plexus:        Appears normal         Diaphragm:              Not well visualized  Cerebellum:            Appears normal         Stomach:                Appears normal, left                                                                        sided  Posterior Fossa:       Appears normal         Abdomen:                Appears normal  Nuchal Fold:           Appears normal         Abdominal Wall:         Appears nml (cord                                                                        insert, abd wall)  Face:                  Not well visualized    Cord Vessels:           Appears normal (3                                                                        vessel cord)  Lips:                  Not well visualized    Kidneys:                Appear normal  Palate:                Not well visualized    Bladder:                Appears normal  Thoracic:              Appears normal         Spine:  Appears normal  Heart:                 Not well visualized    Upper Extremities:      Appears normal  RVOT:                  Not well visualized    Lower Extremities:      Appears normal  Other:  Female gender. Technically difficult due to maternal habitus and fetal          position. ---------------------------------------------------------------------- Cervix Uterus Adnexa  Cervix  Length:            3.8  cm.  Normal appearance by transabdominal scan. ---------------------------------------------------------------------- Myomas   Site                     L(cm)      W(cm)      D(cm)      Location   Anterior                 2.9        2.4        2.7  ----------------------------------------------------------------------   Blood Flow                 RI        PI       Comments   ---------------------------------------------------------------------- Impression  We performed fetal anatomy scan. No makers of  aneuploidies or fetal structural defects are seen. Fetal  biometry is consistent with her previously-established dates.  Amniotic fluid is normal and good fetal activity is seen.  Patient understands the limitations of ultrasound in detecting  fetal anomalies.  Maternal obesity imposes limitations on the resolution of  images, and failure to detect fetal anomalies is more common  in obese pregnant women.  On cell-free fetal DNA screening, the risks of fetal  aneuploidies are not increased.  Patient has chronic hypertension and takes labetalol 400 mg  twice daily.  BP at our office today:141/88  mm Hg. ---------------------------------------------------------------------- Recommendations  -An appointment was made for her to return in 4 weeks for  completion of fetal anatomy. ----------------------------------------------------------------------                  Tama High, MD Electronically Signed Final Report   03/19/2019 11:29 am ----------------------------------------------------------------------   Assessment and Plan:  Pregnancy: G2P1001 at [redacted]w[redacted]d  1. Supervision of other high risk pregnancy, antepartum  2. Chronic hypertension affecting pregnancy - BP"s increased last week but stabilizing  this week on Labetalol  3. Chronic migraine without aura without status migrainosus, not intractable - less HA since starting Fioricet  4. Backache symptom Rx: - Elastic Bandages & Supports (COMFORT FIT MATERNITY SUPP SM) MISC; Wear as directed.  Dispense: 1 each; Refill: 0    Preterm labor symptoms and general obstetric precautions including but not limited to vaginal bleeding, contractions, leaking of fluid and fetal movement were reviewed in detail with the patient. I discussed the assessment and treatment plan with the patient. The patient was provided an opportunity to  ask questions and all were answered. The patient agreed with the plan and demonstrated an understanding of the instructions. The patient was advised to call back or seek an in-person office evaluation/go to MAU at [facility] for any urgent or concerning symptoms. Please refer to After Visit Summary for other counseling recommendations.   I provided 10 minutes of face-to-face time during this encounter.  Return in about 4 weeks (around 04/17/2019) for MyChart.  HOB patient-Faculty only.  Future Appointments  Date Time Provider [place]  04/16/2019 10:15 AM [place] Early [facility]-US  04/16/2019 10:15 AM [place] 4 WH-MFCUS MFC-US    Baltazar Najjar, [place] [facility], [place] [facility] 03/20/2019

## 2019-03-28 ENCOUNTER — Other Ambulatory Visit: Payer: Self-pay

## 2019-03-28 ENCOUNTER — Encounter: Payer: Self-pay | Admitting: Obstetrics

## 2019-03-28 ENCOUNTER — Ambulatory Visit (INDEPENDENT_AMBULATORY_CARE_PROVIDER_SITE_OTHER): Payer: Medicaid Other | Admitting: Obstetrics

## 2019-03-28 VITALS — BP 142/95 | HR 98 | Wt 242.0 lb

## 2019-03-28 DIAGNOSIS — R300 Dysuria: Secondary | ICD-10-CM | POA: Diagnosis not present

## 2019-03-28 DIAGNOSIS — O26899 Other specified pregnancy related conditions, unspecified trimester: Secondary | ICD-10-CM | POA: Diagnosis not present

## 2019-03-28 DIAGNOSIS — O10912 Unspecified pre-existing hypertension complicating pregnancy, second trimester: Secondary | ICD-10-CM

## 2019-03-28 DIAGNOSIS — O10919 Unspecified pre-existing hypertension complicating pregnancy, unspecified trimester: Secondary | ICD-10-CM

## 2019-03-28 DIAGNOSIS — O26892 Other specified pregnancy related conditions, second trimester: Secondary | ICD-10-CM

## 2019-03-28 DIAGNOSIS — O09892 Supervision of other high risk pregnancies, second trimester: Secondary | ICD-10-CM

## 2019-03-28 DIAGNOSIS — Z3A20 20 weeks gestation of pregnancy: Secondary | ICD-10-CM

## 2019-03-28 DIAGNOSIS — O09899 Supervision of other high risk pregnancies, unspecified trimester: Secondary | ICD-10-CM

## 2019-03-28 LAB — POCT URINALYSIS DIPSTICK
Bilirubin, UA: NEGATIVE
Glucose, UA: NEGATIVE
Ketones, UA: NEGATIVE
Leukocytes, UA: NEGATIVE
Nitrite, UA: NEGATIVE
Protein, UA: NEGATIVE
Spec Grav, UA: 1.015 (ref 1.010–1.025)
Urobilinogen, UA: 0.2 E.U./dL
pH, UA: 6 (ref 5.0–8.0)

## 2019-03-28 MED ORDER — LABETALOL HCL 300 MG PO TABS: 600.0000 mg | ORAL_TABLET | Freq: Two times a day (BID) | ORAL | 5 refills | Status: DC

## 2019-03-28 MED ORDER — NITROFURANTOIN MONOHYD MACRO 100 MG PO CAPS: 100.0000 mg | ORAL_CAPSULE | Freq: Two times a day (BID) | ORAL | 2 refills | Status: DC

## 2019-03-28 NOTE — Progress Notes (Signed)
Patient reports fetal movement with some pressure. Pt reports strong urge to urinate with only a little coming out, complains of pinching pressure.BP elevated, pt states that she just took labetalol before walking in.

## 2019-03-28 NOTE — Progress Notes (Signed)
Subjective:  Terri [name] is a 33 y.o. G2P1001 at [redacted]w[redacted]d being seen today for ongoing prenatal care.  She is currently monitored for the following issues for this high-risk pregnancy and has Obesity; TOBACCO USER; Primary hypertension; ASTHMA, INTERMITTENT; Unspecified episodic mood disorder; Nephrolithiasis; Mixed incontinence; Migraine; Supervision of other high risk pregnancy, antepartum; and Chronic hypertension affecting pregnancy on their problem list.  Patient reports urinary frequency and dribbling small amounts when emptying bladder.  Contractions: Not present. Vag. Bleeding: None.  Movement: Present. Denies leaking of fluid.   The following portions of the patient's history were reviewed and updated as appropriate: allergies, current medications, past family history, past medical history, past social history, past surgical history and problem list. Problem list updated.  Objective:   Vitals:   03/28/19 0907  BP: (!) 142/95  Pulse: 98  Weight: 242 lb (109.8 kg)    Fetal Status:     Movement: Present     General:  Alert, oriented and cooperative. Patient is in no acute distress.  Skin: Skin is warm and dry. No rash noted.   Cardiovascular: Normal heart rate noted  Respiratory: Normal respiratory effort, no problems with respiration noted  Abdomen: Soft, gravid, appropriate for gestational age. Pain/Pressure: Present     Pelvic:  Cervical exam deferred        Extremities: Normal range of motion.  Edema: None  Mental Status: Normal mood and affect. Normal behavior. Normal judgment and thought content.   Urinalysis:      Assessment and Plan:  Pregnancy: G2P1001 at [redacted]w[redacted]d  1. Supervision of other high risk pregnancy, antepartum  2. Chronic hypertension affecting pregnancy - not well controlled - Labetalol increased Rx: - labetalol (NORMODYNE) 300 MG tablet; Take 2 tablets (600 mg total) by mouth 2 (two) times daily.  Dispense: 60 tablet; Refill: 5  3. Dysuria during  pregnancy, antepartum Rx: - Culture, OB Urine - nitrofurantoin, macrocrystal-monohydrate, (MACROBID) 100 MG capsule; Take 1 capsule (100 mg total) by mouth 2 (two) times daily. 1 po BID x 7days  Dispense: 14 capsule; Refill: 2 - POCT Urinalysis Dipstick  Preterm labor symptoms and general obstetric precautions including but not limited to vaginal bleeding, contractions, leaking of fluid and fetal movement were reviewed in detail with the patient. Please refer to After Visit Summary for other counseling recommendations.  Return in about 4 weeks (around 04/25/2019) for MyChart HOB-Faculty Only.   Shelly Bombard, MD  03/28/2019

## 2019-03-30 LAB — CULTURE, OB URINE

## 2019-03-30 LAB — URINE CULTURE, OB REFLEX: Organism ID, Bacteria: NO GROWTH

## 2019-04-10 ENCOUNTER — Other Ambulatory Visit: Payer: Self-pay

## 2019-04-10 ENCOUNTER — Encounter (HOSPITAL_COMMUNITY): Payer: Self-pay

## 2019-04-10 ENCOUNTER — Inpatient Hospital Stay (HOSPITAL_COMMUNITY)
Admission: AD | Admit: 2019-04-10 | Discharge: 2019-04-10 | Disposition: A | Discharge status: Home / Self Care | Payer: Medicaid Other | Attending: Obstetrics & Gynecology | Admitting: Obstetrics & Gynecology

## 2019-04-10 DIAGNOSIS — J45909 Unspecified asthma, uncomplicated: Secondary | ICD-10-CM | POA: Insufficient documentation

## 2019-04-10 DIAGNOSIS — O4692 Antepartum hemorrhage, unspecified, second trimester: Secondary | ICD-10-CM | POA: Diagnosis not present

## 2019-04-10 DIAGNOSIS — O162 Unspecified maternal hypertension, second trimester: Secondary | ICD-10-CM | POA: Diagnosis not present

## 2019-04-10 DIAGNOSIS — Z79899 Other long term (current) drug therapy: Secondary | ICD-10-CM | POA: Insufficient documentation

## 2019-04-10 DIAGNOSIS — O99512 Diseases of the respiratory system complicating pregnancy, second trimester: Secondary | ICD-10-CM | POA: Insufficient documentation

## 2019-04-10 DIAGNOSIS — Z3A22 22 weeks gestation of pregnancy: Secondary | ICD-10-CM

## 2019-04-10 DIAGNOSIS — Z87891 Personal history of nicotine dependence: Secondary | ICD-10-CM | POA: Diagnosis not present

## 2019-04-10 DIAGNOSIS — O4690 Antepartum hemorrhage, unspecified, unspecified trimester: Secondary | ICD-10-CM

## 2019-04-10 DIAGNOSIS — N930 Postcoital and contact bleeding: Secondary | ICD-10-CM | POA: Diagnosis not present

## 2019-04-10 LAB — URINALYSIS, ROUTINE W REFLEX MICROSCOPIC
Bacteria, UA: NONE SEEN
Bilirubin Urine: NEGATIVE
Glucose, UA: NEGATIVE mg/dL
Hgb urine dipstick: NEGATIVE
Ketones, ur: NEGATIVE mg/dL
Leukocytes,Ua: NEGATIVE
Nitrite: NEGATIVE
Protein, ur: 30 mg/dL — AB
Specific Gravity, Urine: 1.03 (ref 1.005–1.030)
pH: 6 (ref 5.0–8.0)

## 2019-04-10 NOTE — MAU Provider Note (Signed)
None     Chief Complaint:  Vaginal Bleeding   Terri [name] is  33 y.o. G2P1001 at 42w3dpresents complaining of Vaginal Bleeding .  She had intercourse earlier tonight and noticed some dark red/brown spotting while wiping. No cramps. Baby active.   Obstetrical/Gynecological History: OB History    Gravida  2   Para  1   Term  1   Preterm      AB      Living  1     SAB      TAB      Ectopic      Multiple      Live Births  1          Past Medical History: Past Medical History:  Diagnosis Date  . Asthma   . Hypertension   . Hypertention, malignant, with acute intensive management     Past Surgical History: History reviewed. No pertinent surgical history.  Family History: Family History  Problem Relation Age of Onset  . Hypertension Mother   . Cancer Maternal Grandfather   . Cancer Paternal Grandfather     Social History: Social History   Tobacco Use  . Smoking status: Former Smoker    Packs/day: 0.20    Types: Cigarettes    Quit date: 11/24/2018    Years since quitting: 0.3  . 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 Alcohol use: Not Currently    Comment: 11/2018  . Drug use: Not Currently    Types: Marijuana    Comment: 12/11/2018; previously used    Allergies:  Allergies  Allergen Reactions  . Vicodin [Hydrocodone-Acetaminophen] Itching    Meds:  Medications Prior to Admission  Medication Sig Dispense Refill Last Dose  . Blood Pressure Monitoring KIT 1 kit by Does not apply route once a week. 1 kit 0 04/10/2019 at Unknown time  . Elastic Bandages & Supports (COMFORT FIT MATERNITY SUPP SM) MISC Wear as directed. 1 each 0 04/09/2019 at Unknown time  . labetalol (NORMODYNE) 300 MG tablet Take 2 tablets (600 mg total) by mouth 2 (two) times daily. 60 tablet 5 04/10/2019 at Unknown time  . Prenatal MV-Min-FA-Omega-3 (PRENATAL GUMMIES/DHA & FA) 0.4-32.5 MG CHEW Chew by mouth.   04/10/2019 at Unknown time  .  docusate sodium (COLACE) 100 MG capsule Take 1 capsule (100 mg total) by mouth 2 (two) times daily as needed. (Patient not taking: Reported on 03/19/2019) 60 capsule 5   . metoCLOPramide (REGLAN) 10 MG tablet Take 1 tablet (10 mg total) by mouth every 6 (six) hours as needed for nausea (Or headache). (Patient not taking: Reported on 02/20/2019) 30 tablet 1   . nitrofurantoin, macrocrystal-monohydrate, (MACROBID) 100 MG capsule Take 1 capsule (100 mg total) by mouth 2 (two) times daily. 1 po BID x 7days 14 capsule 2   . polyethylene glycol (MIRALAX) 17 g packet Take 17 g by mouth at bedtime as needed for mild constipation or moderate constipation. (Patient not taking: Reported on 03/19/2019) 30 each 5   . promethazine (PHENERGAN) 25 MG tablet Take 1 tablet (25 mg total) by mouth every 6 (six) hours as needed for nausea or vomiting. (Patient not taking: Reported on 01/07/2019) 30 tablet 0     Review of Systems   Constitutional: Negative for fever and chills Eyes: Negative for visual disturbances Respiratory: Negative for shortness of breath, dyspnea Cardiovascular: Negative for chest pain or palpitations  Gastrointestinal: Negative for vomiting,  diarrhea and constipation Genitourinary: Negative for dysuria and urgency Musculoskeletal: Negative for back pain, joint pain, myalgias.  Normal ROM  Neurological: Negative for dizziness and headaches    Physical Exam  Blood pressure (!) 142/93, pulse 88, temperature 98.5 F (36.9 C), resp. rate 18, height _0  (1.727 m), weight 111.6 kg, last menstrual period 11/09/2018. GENERAL: Well-developed, well-nourished female in no acute distress.  LUNGS: Normal respiratory effort HEART: Regular rate and rhythm. ABDOMEN: Soft, nontender, nondistended, gravid.  EXTREMITIES: Nontender, no edema, 2+ distal pulses. DTR's 2+ CERVICAL EXAM: LTC, firm.  SSE: Vagina normal in appearance.  Scant amount of light brown discharge. No odor, cx non friable.  FHT 150's  doppler  Labs: No results found for this or any previous visit (from the past 24 hour(s)). Imaging Studies:    Assessment: IVEE POELLNITZ is  33 y.o. G2P1001 at 72w3dpresents with postcoital spotting.  Plan: Pt reassured, dc home  FChristin Fudge10/29/202011:40 PM

## 2019-04-10 NOTE — Discharge Instructions (Signed)
Vaginal Bleeding During Pregnancy, Second Trimester  A small amount of bleeding (spotting) from the vagina is common during pregnancy. Sometimes the bleeding is normal and is not a sign of problems. In some other cases, it is a sign of something serious. Tell your doctor right away if there is any bleeding from your vagina. Follow these instructions at home: Activity  Follow your doctor's instructions about how active you can be.  If needed, make plans for someone to help with your normal activities.  Do not exercise or do activities that take a lot of effort until your doctor says that this is safe.  Do not lift anything that is heavier than 10 lb (4.5 kg) until your doctor says that this is safe.  Do not have sex or orgasms until your doctor says that this is safe. Medicines  Take over-the-counter and prescription medicines only as told by your doctor.  Do not take aspirin. It can cause bleeding. General instructions  Watch your condition for any changes.  Write down: ? The number of pads you use each day. ? How often you change pads. ? How soaked your pads are.  Do not use tampons.  Do not douche.  If you pass any tissue from your vagina, save it to show to your doctor.  Keep all follow-up visits as told by your doctor. This is important. Contact a doctor if:  You have bleeding in the vagina at any time during pregnancy.  You have cramps.  You have a fever that does not get better with medicine. Get help right away if:  You have very bad cramps in your back or belly (abdomen).  You have contractions.  You have chills.  You pass large clots or a lot of tissue from your vagina.  Your bleeding gets worse.  You feel light-headed.  You feel weak.  You pass out (faint).  You are leaking fluid from your vagina.  You have a gush of fluid from your vagina. Summary  Sometimes vaginal bleeding during pregnancy is normal and is not a problem. Sometimes it may  be a sign of something serious.  Tell your doctor about any bleeding from your vagina right away.  Follow your doctor's instructions about how active you can be. You may need someone to help you with your normal activities. This information is not intended to replace advice given to you by your health care provider. Make sure you discuss any questions you have with your health care provider. Document Released: 10/13/2013 Document Revised: 09/17/2018 Document Reviewed: 08/30/2016 Elsevier Patient Education  2020 [employer].

## 2019-04-10 NOTE — MAU Note (Addendum)
Having vag bleeding tonight. Some abd cramping earlier but no pain now. Had intercouse earlier tonight. Bleeding was brown in color

## 2019-04-16 ENCOUNTER — Other Ambulatory Visit (HOSPITAL_COMMUNITY): Payer: Self-pay | Admitting: *Deleted

## 2019-04-16 ENCOUNTER — Encounter (HOSPITAL_COMMUNITY): Payer: Self-pay

## 2019-04-16 ENCOUNTER — Other Ambulatory Visit: Payer: Self-pay

## 2019-04-16 ENCOUNTER — Ambulatory Visit (HOSPITAL_COMMUNITY): Payer: Medicaid Other | Admitting: *Deleted

## 2019-04-16 ENCOUNTER — Ambulatory Visit (HOSPITAL_COMMUNITY)
Admission: RE | Admit: 2019-04-16 | Discharge: 2019-04-16 | Disposition: A | Discharge status: Home / Self Care | Payer: Medicaid Other | Source: Ambulatory Visit | Attending: Obstetrics and Gynecology | Admitting: Obstetrics and Gynecology

## 2019-04-16 VITALS — BP 140/86 | HR 92 | Temp 97.5°F

## 2019-04-16 DIAGNOSIS — D259 Leiomyoma of uterus, unspecified: Secondary | ICD-10-CM

## 2019-04-16 DIAGNOSIS — O3412 Maternal care for benign tumor of corpus uteri, second trimester: Secondary | ICD-10-CM

## 2019-04-16 DIAGNOSIS — O10012 Pre-existing essential hypertension complicating pregnancy, second trimester: Secondary | ICD-10-CM | POA: Diagnosis not present

## 2019-04-16 DIAGNOSIS — O99212 Obesity complicating pregnancy, second trimester: Secondary | ICD-10-CM

## 2019-04-16 DIAGNOSIS — O10912 Unspecified pre-existing hypertension complicating pregnancy, second trimester: Secondary | ICD-10-CM | POA: Diagnosis not present

## 2019-04-16 DIAGNOSIS — O99332 Smoking (tobacco) complicating pregnancy, second trimester: Secondary | ICD-10-CM

## 2019-04-16 DIAGNOSIS — Z3A22 22 weeks gestation of pregnancy: Secondary | ICD-10-CM | POA: Diagnosis not present

## 2019-04-16 DIAGNOSIS — Z362 Encounter for other antenatal screening follow-up: Secondary | ICD-10-CM

## 2019-04-16 DIAGNOSIS — O10919 Unspecified pre-existing hypertension complicating pregnancy, unspecified trimester: Secondary | ICD-10-CM

## 2019-04-17 ENCOUNTER — Other Ambulatory Visit: Payer: Self-pay

## 2019-04-17 ENCOUNTER — Telehealth (INDEPENDENT_AMBULATORY_CARE_PROVIDER_SITE_OTHER): Payer: Medicaid Other | Admitting: Obstetrics and Gynecology

## 2019-04-17 ENCOUNTER — Encounter: Payer: Self-pay | Admitting: Obstetrics and Gynecology

## 2019-04-17 VITALS — BP 140/86

## 2019-04-17 DIAGNOSIS — Z3A23 23 weeks gestation of pregnancy: Secondary | ICD-10-CM

## 2019-04-17 DIAGNOSIS — O09892 Supervision of other high risk pregnancies, second trimester: Secondary | ICD-10-CM

## 2019-04-17 DIAGNOSIS — O09899 Supervision of other high risk pregnancies, unspecified trimester: Secondary | ICD-10-CM

## 2019-04-17 NOTE — Progress Notes (Signed)
TELEHEALTH OBSTETRICS PRENATAL VIRTUAL VIDEO VISIT ENCOUNTER NOTE  Provider location: [facility]   I connected with Terri [name] on 04/17/19 at 10:30 AM EST by MyChart Video Encounter at home and verified that I am speaking with the correct person using two identifiers.   I discussed the limitations, risks, security and privacy concerns of performing an evaluation and management service virtually and the availability of in person appointments. I also discussed with the patient that there may be a patient responsible charge related to this service. The patient expressed understanding and agreed to proceed. Subjective:  Terri [name] is a 33 y.o. G2P1001 at [redacted]w[redacted]d being seen today for ongoing prenatal care.  She is currently monitored for the following issues for this high-risk pregnancy and has Obesity; TOBACCO USER; Primary hypertension; ASTHMA, INTERMITTENT; Unspecified episodic mood disorder; Nephrolithiasis; Mixed incontinence; Migraine; Supervision of other high risk pregnancy, antepartum; and Chronic hypertension affecting pregnancy on their problem list.  Patient reports general discomforts of pregnancy.  Contractions: Not present. Vag. Bleeding: None.  Movement: Present. Denies any leaking of fluid.   The following portions of the patient's history were reviewed and updated as appropriate: allergies, current medications, past family history, past medical history, past social history, past surgical history and problem list.   Objective:   Vitals:   04/17/19 1046  BP: 140/86    Fetal Status:     Movement: Present     General:  Alert, oriented and cooperative. Patient is in no acute distress.  Respiratory: Normal respiratory effort, no problems with respiration noted  Mental Status: Normal mood and affect. Normal behavior. Normal judgment and thought content.  Rest of physical exam deferred due to type of encounter  Imaging: [place] Mfm Ob Detail +14  Wk  Result Date: 03/19/2019 ----------------------------------------------------------------------  OBSTETRICS REPORT                       (Signed Final 03/19/2019 11:29 am) ---------------------------------------------------------------------- Patient Info  ID #:       [id number]                          D.O.B.:  03/16/1986 (32 yrs)  Name:       Terri [name] [facility]                  Visit Date: 03/19/2019 08:56 am ---------------------------------------------------------------------- Performed By  Performed By:     Jeanene Erb BS,      Ref. Address:     [place], [place]  [place]  Attending:        Tama High MD        [facility]  Referred By:      Shelly Bombard MD ---------------------------------------------------------------------- Orders   #  Description                          Code         Ordered By   1  [place] MFM OB DETAIL +14 [place]              76811.01     Baltazar Najjar  ----------------------------------------------------------------------   #  Order #                    Accession #                 Episode #   1  [id number]                  [id number]                  [id number]  ---------------------------------------------------------------------- Indications   Encounter for antenatal screening for          Z36.3   malformations (low risk NIPS)   Hypertension - Chronic/Pre-existing            O10.019   (labetalol)   Tobacco use complicating pregnancy,            O99.332   second trimester   Obesity complicating pregnancy, second         O99.212   trimester   Uterine fibroids affecting pregnancy in        O34.12, D25.9   second trimester, antepartum   [redacted] weeks gestation  of pregnancy                Z3A.18  ---------------------------------------------------------------------- Vital Signs  Weight (lb): 230                               Height:        5'8"  BMI:         34.97 ---------------------------------------------------------------------- Fetal Evaluation  Num Of Fetuses:         1  Fetal Heart Rate(bpm):  158  Cardiac Activity:       Observed  Presentation:           Breech  Placenta:               Posterior  P. Cord Insertion:      Visualized  Amniotic Fluid  AFI FV:      Within normal limits                              Largest Pocket(cm)  6.5 ---------------------------------------------------------------------- Biometry  BPD:      45.4  mm     G. Age:  19w 5d         91  %    CI:        73.77   %    70 - 86                                                          FL/HC:      17.7   %    16.1 - 18.3  HC:      167.9  mm     G. Age:  19w 3d         82  %    HC/AC:      1.13        1.09 - 1.39  AC:      148.4  mm     G. Age:  20w 1d         90  %    FL/BPD:     65.6   %  FL:       29.8  mm     G. Age:  19w 1d         67  %    FL/AC:      20.1   %    20 - 24  HUM:      28.5  mm     G. Age:  19w 1d         70  %  NFT:       3.5  mm  Est. FW:     306  gm    0 lb 11 oz      96  % ---------------------------------------------------------------------- OB History  Gravidity:    2         Term:   1        Prem:   0        SAB:   0  TOP:          0       Ectopic:  0        Living: 1 ---------------------------------------------------------------------- Gestational Age  LMP:           18w 4d        Date:  11/09/18                 EDD:   08/16/19  U/S Today:     19w 4d                                        EDD:   08/09/19  Best:          18w 4d     Det. By:  LMP  (11/09/18)          EDD:   08/16/19 ---------------------------------------------------------------------- Anatomy  Cranium:               Appears normal         LVOT:                   Not well  visualized  Cavum:  Appears normal         Aortic Arch:            Appears normal  Ventricles:            Appears normal         Ductal Arch:            Appears normal  Choroid Plexus:        Appears normal         Diaphragm:              Not well visualized  Cerebellum:            Appears normal         Stomach:                Appears normal, left                                                                        sided  Posterior Fossa:       Appears normal         Abdomen:                Appears normal  Nuchal Fold:           Appears normal         Abdominal Wall:         Appears nml (cord                                                                        insert, abd wall)  Face:                  Not well visualized    Cord Vessels:           Appears normal (3                                                                        vessel cord)  Lips:                  Not well visualized    Kidneys:                Appear normal  Palate:                Not well visualized    Bladder:                Appears normal  Thoracic:              Appears normal         Spine:  Appears normal  Heart:                 Not well visualized    Upper Extremities:      Appears normal  RVOT:                  Not well visualized    Lower Extremities:      Appears normal  Other:  Female gender. Technically difficult due to maternal habitus and fetal          position. ---------------------------------------------------------------------- Cervix Uterus Adnexa  Cervix  Length:            3.8  cm.  Normal appearance by transabdominal scan. ---------------------------------------------------------------------- Myomas   Site                     L(cm)      W(cm)      D(cm)      Location   Anterior                 2.9        2.4        2.7  ----------------------------------------------------------------------   Blood Flow                 RI        PI       Comments   ---------------------------------------------------------------------- Impression  We performed fetal anatomy scan. No makers of  aneuploidies or fetal structural defects are seen. Fetal  biometry is consistent with her previously-established dates.  Amniotic fluid is normal and good fetal activity is seen.  Patient understands the limitations of ultrasound in detecting  fetal anomalies.  Maternal obesity imposes limitations on the resolution of  images, and failure to detect fetal anomalies is more common  in obese pregnant women.  On cell-free fetal DNA screening, the risks of fetal  aneuploidies are not increased.  Patient has chronic hypertension and takes labetalol 400 mg  twice daily.  BP at our office today:141/88  mm Hg. ---------------------------------------------------------------------- Recommendations  -An appointment was made for her to return in 4 weeks for  completion of fetal anatomy. ----------------------------------------------------------------------                  Tama High, MD Electronically Signed Final Report   03/19/2019 11:29 am ----------------------------------------------------------------------  [place] Mfm Ob Follow Up  Result Date: 04/16/2019 ----------------------------------------------------------------------  OBSTETRICS REPORT                       (Signed Final 04/16/2019 03:34 pm) ---------------------------------------------------------------------- Patient Info  ID #:       [id number]                          D.O.B.:  [birth year] (32 yrs)  Name:       Terri [name] [facility]                  Visit Date: 04/16/2019 11:47 am ---------------------------------------------------------------------- Performed By  Performed By:     Hubert Azure          Ref. Address:     [address] [place]                                                             [place]  Attending:        Tama High MD        [facility]  Referred By:      Shelly Bombard MD ---------------------------------------------------------------------- Orders   #  Description                          Code         Ordered By   1  [place] MFM OB FOLLOW UP                  [phone]     Tama High  ----------------------------------------------------------------------   #  Order #                    Accession #                 Episode #   1  [id number]                  [id number]                  [id number]  ---------------------------------------------------------------------- Indications   [redacted] weeks gestation of pregnancy                Z3A.22   Encounter for antenatal screening for          Z36.3   malformations (low risk NIPS)   Hypertension - Chronic/Pre-existing            O10.019   (labetalol)   Tobacco use complicating pregnancy,            O99.332   second trimester   Obesity complicating pregnancy, second         O99.212   trimester   Uterine fibroids affecting pregnancy in        O34.12, D25.9   second trimester, antepartum  ---------------------------------------------------------------------- Vital Signs                                                 Height:  5'8" ---------------------------------------------------------------------- Fetal Evaluation  Num Of Fetuses:         1  Fetal Heart Rate(bpm):  154  Cardiac Activity:       Observed  Presentation:           Cephalic  Placenta:               Posterior  P. Cord Insertion:      Visualized, central  Amniotic Fluid  AFI FV:      Within normal limits                              Largest Pocket(cm)                              4.65 ---------------------------------------------------------------------- Biometry  BPD:      58.6  mm     G. Age:  24w 0d         90  %    CI:        74.44   %    70 - 86                                                           FL/HC:      17.8   %    19.2 - 20.8  HC:      215.6  mm     G. Age:  23w 4d         78  %    HC/AC:      1.11        1.05 - 1.21  AC:       194   mm     G. Age:  24w 1d         86  %    FL/BPD:     65.5   %    71 - 87  FL:       38.4  mm     G. Age:  22w 2d         30  %    FL/AC:      19.8   %    20 - 24  HUM:      36.7  mm     G. Age:  22w 6d         50  %  Est. FW:     589  gm      1 lb 5 oz     82  % ---------------------------------------------------------------------- OB History  Gravidity:    2         Term:   1        Prem:   0        SAB:   0  TOP:          0       Ectopic:  0        Living: 1 ---------------------------------------------------------------------- Gestational Age  LMP:           22w 4d        Date:  11/09/18                 EDD:  08/16/19  U/S Today:     23w 4d                                        EDD:   08/09/19  Best:          22w 4d     Det. By:  LMP  (11/09/18)          EDD:   08/16/19 ---------------------------------------------------------------------- Anatomy  Cranium:               Appears normal         LVOT:                   Appears normal  Cavum:                 Appears normal         Aortic Arch:            Appears normal  Ventricles:            Appears normal         Ductal Arch:            Appears normal  Choroid Plexus:        Previously seen        Diaphragm:              Appears normal  Cerebellum:            Previously seen        Stomach:                Appears normal, left                                                                        sided  Posterior Fossa:       Previously seen        Abdomen:                Appears normal  Nuchal Fold:           Previously seen        Abdominal Wall:         Previously seen  Face:                  Appears normal         Cord Vessels:           Previously seen                         (orbits and profile)  Lips:                  Appears normal         Kidneys:                 Appear normal  Palate:                Appears normal         Bladder:                Appears normal  Thoracic:              Appears normal         Spine:                  Previously seen  Heart:                 Appears normal         Upper Extremities:      Previously seen                         (4CH, axis, and                         situs)  RVOT:                  Appears normal         Lower Extremities:      Previously seen  Other:  Female gender. Technically difficult due to maternal habitus and fetal          position. ---------------------------------------------------------------------- Cervix Uterus Adnexa  Cervix  Not adaquately visualized  Uterus  Multiple fibroids noted, see table below.  Left Ovary  Not visualized.  Right Ovary  Not visualized.  Adnexa  No abnormality visualized. ---------------------------------------------------------------------- Myomas   Site                     L(cm)      W(cm)      D(cm)      Location   Anterior                 4.1        3.1        3.7   Posterior                4          2.5        2.6  ----------------------------------------------------------------------   Blood Flow                 RI        PI       Comments  ---------------------------------------------------------------------- Impression  Patient returned for completion of fetal anatomy. Fetal  biometry is consistent with her previously-established dates.  Amniotic fluid is normal and good fetal activity is seen. Fetal  anatomical survey was completed and appears normal.  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  Chronic hypertension. Patient takes labetalol for control.  BP at our office: 140/86 mm Hg. ---------------------------------------------------------------------- Recommendations  -An appointment was made for her to return in 4 weeks for  fetal growth assessment. ----------------------------------------------------------------------                  Tama High, MD  Electronically Signed Final Report   04/16/2019 03:34 pm ----------------------------------------------------------------------   Assessment and Plan:  Pregnancy: G2P1001 at [redacted]w[redacted]d 1. Supervision of other high risk pregnancy, antepartum Stable Glucola next visit  2. CHTN BP stable  Continue with Labetalol and qd BASA Growth scan yesterday F/U in 1 month Preterm labor symptoms and general obstetric precautions including but not limited to vaginal bleeding, contractions, leaking of fluid and fetal movement were reviewed in detail with the patient. I discussed the assessment and treatment plan with the patient. The patient was provided an opportunity to ask questions and all were answered. The patient agreed with the  plan and demonstrated an understanding of the instructions. The patient was advised to call back or seek an in-person office evaluation/go to [facility]U at [facility] for any urgent or concerning symptoms. Please refer to After Visit Summary for other counseling recommendations.   I provided 8 minutes of face-to-face time during this encounter.  Return in about 4 weeks (around 05/15/2019) for OB visit, face to face for Glucola.  Future Appointments  Date Time Provider [place]  05/28/2019 10:45 AM WH-MFC [place] 2 WH-MFCUS MFC-US    Adilee Lemme L Capria Cartaya, [place] for [facility] [place], [place]

## 2019-04-17 NOTE — Progress Notes (Signed)
Pt is on the phone preparing for virtual visit with provider. [redacted]w[redacted]d. Pt reports she had a nose bleed this morning.

## 2019-05-14 ENCOUNTER — Other Ambulatory Visit: Payer: Medicaid Other

## 2019-05-14 ENCOUNTER — Encounter: Payer: Self-pay | Admitting: Obstetrics and Gynecology

## 2019-05-14 ENCOUNTER — Ambulatory Visit (INDEPENDENT_AMBULATORY_CARE_PROVIDER_SITE_OTHER): Payer: Medicaid Other | Admitting: Obstetrics and Gynecology

## 2019-05-14 ENCOUNTER — Other Ambulatory Visit: Payer: Self-pay

## 2019-05-14 VITALS — BP 127/87 | HR 102 | Wt 247.4 lb

## 2019-05-14 DIAGNOSIS — E669 Obesity, unspecified: Secondary | ICD-10-CM

## 2019-05-14 DIAGNOSIS — O10912 Unspecified pre-existing hypertension complicating pregnancy, second trimester: Secondary | ICD-10-CM

## 2019-05-14 DIAGNOSIS — Z3A27 27 weeks gestation of pregnancy: Secondary | ICD-10-CM

## 2019-05-14 DIAGNOSIS — O99212 Obesity complicating pregnancy, second trimester: Secondary | ICD-10-CM

## 2019-05-14 DIAGNOSIS — O09899 Supervision of other high risk pregnancies, unspecified trimester: Secondary | ICD-10-CM

## 2019-05-14 DIAGNOSIS — O09892 Supervision of other high risk pregnancies, second trimester: Secondary | ICD-10-CM

## 2019-05-14 DIAGNOSIS — O10919 Unspecified pre-existing hypertension complicating pregnancy, unspecified trimester: Secondary | ICD-10-CM

## 2019-05-14 MED ORDER — FAMOTIDINE 20 MG PO TABS: 20.0000 mg | ORAL_TABLET | Freq: Two times a day (BID) | ORAL | 3 refills | Status: DC

## 2019-05-14 NOTE — Progress Notes (Signed)
Patient reports fetal movement with some pressure. 

## 2019-05-14 NOTE — Progress Notes (Signed)
   PRENATAL VISIT NOTE  Subjective:  Terri [name] is a 33 y.o. G2P1001 at [redacted]w[redacted]d being seen today for ongoing prenatal care.  She is currently monitored for the following issues for this high-risk pregnancy and has Obesity; TOBACCO USER; ASTHMA, INTERMITTENT; Unspecified episodic mood disorder; Nephrolithiasis; Mixed incontinence; Migraine; Supervision of other high risk pregnancy, antepartum; and Chronic hypertension affecting pregnancy on their problem list.  Patient reports no complaints.  Contractions: Not present. Vag. Bleeding: None.  Movement: Present. Denies leaking of fluid.   The following portions of the patient's history were reviewed and updated as appropriate: allergies, current medications, past family history, past medical history, past social history, past surgical history and problem list.   Objective:   Vitals:   05/14/19 0923  BP: 127/87  Pulse: (!) 102  Weight: 247 lb 6.4 oz (112.2 kg)    Fetal Status:     Movement: Present     General:  Alert, oriented and cooperative. Patient is in no acute distress.  Skin: Skin is warm and dry. No rash noted.   Cardiovascular: Normal heart rate noted  Respiratory: Normal respiratory effort, no problems with respiration noted  Abdomen: Soft, gravid, appropriate for gestational age.  Pain/Pressure: Present     Pelvic: Cervical exam deferred        Extremities: Normal range of motion.  Edema: None  Mental Status: Normal mood and affect. Normal behavior. Normal judgment and thought content.   Assessment and Plan:  Pregnancy: G2P1001 at [redacted]w[redacted]d 1. Supervision of other high risk pregnancy, antepartum Patient is doing well without complaints Third trimester labs and glucola rescheduled as patient is not fasting Patient does not want contraception  2. Chronic hypertension affecting pregnancy Normotensive- continue labetalol and ASA Follow up growth ultrasound scheduled  3. Class 2 obesity without serious comorbidity with body  mass index (BMI) of 37.0 to 37.9 in adult, unspecified obesity type   Preterm labor symptoms and general obstetric precautions including but not limited to vaginal bleeding, contractions, leaking of fluid and fetal movement were reviewed in detail with the patient. Please refer to After Visit Summary for other counseling recommendations.   Return in about 2 weeks (around 05/28/2019) for Virtual, ROB, High risk.  Future Appointments  Date Time Provider [place]  05/14/2019  9:45 AM Jamerica Snavely, Vickii Chafe, MD [facility] None  05/28/2019 10:45 AM WH-MFC [place] 2 WH-MFCUS MFC-US    Mora Bellman, MD

## 2019-05-14 NOTE — Addendum Note (Signed)
Addended by: Mora Bellman on: 05/14/2019 09:59 AM   Modules accepted: Orders

## 2019-05-15 ENCOUNTER — Other Ambulatory Visit: Payer: Medicaid Other

## 2019-05-15 ENCOUNTER — Encounter: Payer: Medicaid Other | Admitting: Obstetrics and Gynecology

## 2019-05-15 DIAGNOSIS — O09899 Supervision of other high risk pregnancies, unspecified trimester: Secondary | ICD-10-CM | POA: Diagnosis not present

## 2019-05-16 LAB — CBC
Hematocrit: 32.6 % — ABNORMAL LOW (ref 34.0–46.6)
Hemoglobin: 10.8 g/dL — ABNORMAL LOW (ref 11.1–15.9)
MCH: 30.8 pg (ref 26.6–33.0)
MCHC: 33.1 g/dL (ref 31.5–35.7)
MCV: 93 fL (ref 79–97)
Platelets: 245 10*3/uL (ref 150–450)
RBC: 3.51 x10E6/uL — ABNORMAL LOW (ref 3.77–5.28)
RDW: 12.5 % (ref 11.7–15.4)
WBC: 6 10*3/uL (ref 3.4–10.8)

## 2019-05-16 LAB — HIV ANTIBODY (ROUTINE TESTING W REFLEX): HIV Screen 4th Generation wRfx: NONREACTIVE

## 2019-05-16 LAB — GLUCOSE TOLERANCE, 2 HOURS W/ 1HR
Glucose, 1 hour: 99 mg/dL (ref 65–179)
Glucose, 2 hour: 124 mg/dL (ref 65–152)
Glucose, Fasting: 90 mg/dL (ref 65–91)

## 2019-05-16 LAB — RPR: RPR Ser Ql: NONREACTIVE

## 2019-05-24 ENCOUNTER — Encounter (HOSPITAL_COMMUNITY): Payer: Self-pay | Admitting: Family Medicine

## 2019-05-24 ENCOUNTER — Inpatient Hospital Stay (HOSPITAL_COMMUNITY)
Admission: AD | Admit: 2019-05-24 | Discharge: 2019-05-24 | Disposition: A | Discharge status: Home / Self Care | Payer: Medicaid Other | Attending: Family Medicine | Admitting: Family Medicine

## 2019-05-24 ENCOUNTER — Other Ambulatory Visit: Payer: Self-pay

## 2019-05-24 DIAGNOSIS — Z885 Allergy status to narcotic agent status: Secondary | ICD-10-CM | POA: Insufficient documentation

## 2019-05-24 DIAGNOSIS — J45909 Unspecified asthma, uncomplicated: Secondary | ICD-10-CM | POA: Insufficient documentation

## 2019-05-24 DIAGNOSIS — Z3A28 28 weeks gestation of pregnancy: Secondary | ICD-10-CM | POA: Diagnosis not present

## 2019-05-24 DIAGNOSIS — O99513 Diseases of the respiratory system complicating pregnancy, third trimester: Secondary | ICD-10-CM | POA: Diagnosis not present

## 2019-05-24 DIAGNOSIS — O4703 False labor before 37 completed weeks of gestation, third trimester: Secondary | ICD-10-CM

## 2019-05-24 DIAGNOSIS — Z79899 Other long term (current) drug therapy: Secondary | ICD-10-CM | POA: Diagnosis not present

## 2019-05-24 DIAGNOSIS — Z8249 Family history of ischemic heart disease and other diseases of the circulatory system: Secondary | ICD-10-CM | POA: Insufficient documentation

## 2019-05-24 DIAGNOSIS — Z87891 Personal history of nicotine dependence: Secondary | ICD-10-CM | POA: Diagnosis not present

## 2019-05-24 DIAGNOSIS — O26893 Other specified pregnancy related conditions, third trimester: Secondary | ICD-10-CM | POA: Diagnosis not present

## 2019-05-24 DIAGNOSIS — R519 Headache, unspecified: Secondary | ICD-10-CM | POA: Insufficient documentation

## 2019-05-24 DIAGNOSIS — B9689 Other specified bacterial agents as the cause of diseases classified elsewhere: Secondary | ICD-10-CM | POA: Diagnosis not present

## 2019-05-24 DIAGNOSIS — O163 Unspecified maternal hypertension, third trimester: Secondary | ICD-10-CM | POA: Insufficient documentation

## 2019-05-24 DIAGNOSIS — O10919 Unspecified pre-existing hypertension complicating pregnancy, unspecified trimester: Secondary | ICD-10-CM

## 2019-05-24 DIAGNOSIS — O23593 Infection of other part of genital tract in pregnancy, third trimester: Secondary | ICD-10-CM | POA: Diagnosis not present

## 2019-05-24 LAB — COMPREHENSIVE METABOLIC PANEL
ALT: 12 U/L (ref 0–44)
AST: 16 U/L (ref 15–41)
Albumin: 2.9 g/dL — ABNORMAL LOW (ref 3.5–5.0)
Alkaline Phosphatase: 55 U/L (ref 38–126)
Anion gap: 11 (ref 5–15)
BUN: 8 mg/dL (ref 6–20)
CO2: 23 mmol/L (ref 22–32)
Calcium: 9.3 mg/dL (ref 8.9–10.3)
Chloride: 102 mmol/L (ref 98–111)
Creatinine, Ser: 0.75 mg/dL (ref 0.44–1.00)
GFR calc Af Amer: >60 mL/min (ref >60)
GFR calc non Af Amer: >60 mL/min (ref >60)
Glucose, Bld: 93 mg/dL (ref 70–99)
Potassium: 3.8 mmol/L (ref 3.5–5.1)
Sodium: 136 mmol/L (ref 135–145)
Total Bilirubin: 0.5 mg/dL (ref 0.3–1.2)
Total Protein: 6.3 g/dL — ABNORMAL LOW (ref 6.5–8.1)

## 2019-05-24 LAB — PROTEIN / CREATININE RATIO, URINE
Creatinine, Urine: 360.11 mg/dL
Protein Creatinine Ratio: 0.07 mg/mg{Cre} (ref 0.00–0.15)
Total Protein, Urine: 26 mg/dL

## 2019-05-24 LAB — URINALYSIS, ROUTINE W REFLEX MICROSCOPIC
Bacteria, UA: NONE SEEN
Bilirubin Urine: NEGATIVE
Glucose, UA: NEGATIVE mg/dL
Hgb urine dipstick: NEGATIVE
Ketones, ur: NEGATIVE mg/dL
Nitrite: NEGATIVE
Protein, ur: 30 mg/dL — AB
Specific Gravity, Urine: 1.028 (ref 1.005–1.030)
pH: 5 (ref 5.0–8.0)

## 2019-05-24 LAB — CBC
HCT: 34.1 % — ABNORMAL LOW (ref 36.0–46.0)
Hemoglobin: 11.2 g/dL — ABNORMAL LOW (ref 12.0–15.0)
MCH: 30.9 pg (ref 26.0–34.0)
MCHC: 32.8 g/dL (ref 30.0–36.0)
MCV: 94.2 fL (ref 80.0–100.0)
Platelets: 245 10*3/uL (ref 150–400)
RBC: 3.62 MIL/uL — ABNORMAL LOW (ref 3.87–5.11)
RDW: 12.7 % (ref 11.5–15.5)
WBC: 6.9 10*3/uL (ref 4.0–10.5)
nRBC: 0 % (ref 0.0–0.2)

## 2019-05-24 LAB — WET PREP, GENITAL
Sperm: NONE SEEN
Trich, Wet Prep: NONE SEEN
Yeast Wet Prep HPF POC: NONE SEEN

## 2019-05-24 LAB — FETAL FIBRONECTIN: Fetal Fibronectin: NEGATIVE

## 2019-05-24 MED ORDER — METRONIDAZOLE 500 MG PO TABS: 500.0000 mg | ORAL_TABLET | Freq: Two times a day (BID) | ORAL | 0 refills | Status: AC

## 2019-05-24 MED ORDER — METOCLOPRAMIDE HCL 5 MG/ML IJ SOLN
10.0000 mg | Freq: Four times a day (QID) | INTRAMUSCULAR | Status: DC | PRN
  Administered 2019-05-24: 10 mg via INTRAVENOUS
  Filled 2019-05-24: qty 2

## 2019-05-24 MED ORDER — DEXAMETHASONE SODIUM PHOSPHATE 10 MG/ML IJ SOLN
10.0000 mg | Freq: Once | INTRAMUSCULAR | Status: AC
  Administered 2019-05-24: 10 mg via INTRAVENOUS
  Filled 2019-05-24: qty 1

## 2019-05-24 MED ORDER — LACTATED RINGERS IV BOLUS (SEPSIS)
1000.0000 mL | Freq: Once | INTRAVENOUS | Status: AC
  Administered 2019-05-24: 1000 mL via INTRAVENOUS

## 2019-05-24 NOTE — MAU Note (Signed)
Terri [name] is a 33 y.o. at [redacted]w[redacted]d here in MAU reporting: contractions for 2 days. They come every 6-7 minutes. Today has been having a lot of discharge, saw a little bit of bleeding with this discharge. The discharge is clear and mucus, no odor, no itching. No LOF. +FM  Onset of complaint: 2 days  Pain score: 8/10  Vitals:   05/24/19 1601  BP: (!) 158/88  Pulse: 97  Resp: 16  Temp: 98.9 F (37.2 C)  SpO2: 99%     FHT: +FM  Lab orders placed from triage: UA

## 2019-05-24 NOTE — MAU Provider Note (Signed)
Chief Complaint:  Abdominal Pain and Vaginal Discharge   First Provider Initiated Contact with Patient 05/24/19 1742      HPI: Terri [name] is a 33 y.o. G2P1001 at 50w5dwho presents to maternity admissions reporting onset of cramping, headache, and light spotting when wiping this morning.  She has tried drinking more water but the cramping continues.  She reports the h/a is similar to previous migraines, frontal, constant, more on right side than left.  There are no other symptoms. She has not tried any treatments.  She reports good fetal movement.  HPI  Past Medical History: Past Medical History:  Diagnosis Date  . Asthma   . Hypertension   . Hypertention, malignant, with acute intensive management     Past obstetric history: OB History  Gravida Para Term Preterm AB Living  2 1 1     1   SAB TAB Ectopic Multiple Live Births          1    # Outcome Date GA Lbr Len/2nd Weight Sex Delivery Anes PTL Lv  2 Current           1 Term 02/17/05    M Vag-Spont EPI N LIV    Past Surgical History: History reviewed. No pertinent surgical history.  Family History: Family History  Problem Relation Age of Onset  . Hypertension Mother   . Cancer Maternal Grandfather   . Cancer Paternal Grandfather     Social History: Social History   Tobacco Use  . Smoking status: Former Smoker    Packs/day: 0.20    Types: Cigarettes    Quit date: 11/24/2018    Years since quitting: 0.4  . 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 Alcohol use: Not Currently    Comment: 11/2018  . Drug use: Not Currently    Types: Marijuana    Comment: 12/11/2018; previously used    Allergies:  Allergies  Allergen Reactions  . Vicodin [Hydrocodone-Acetaminophen] Itching    Meds:  Medications Prior to Admission  Medication Sig Dispense Refill Last Dose  . labetalol (NORMODYNE) 300 MG tablet Take 2 tablets (600 mg total) by mouth 2 (two) times daily. 60 tablet 5 05/24/2019  at 1500  . Blood Pressure Monitoring KIT 1 kit by Does not apply route once a week. 1 kit 0   . docusate sodium (COLACE) 100 MG capsule Take 1 capsule (100 mg total) by mouth 2 (two) times daily as needed. (Patient not taking: Reported on 03/19/2019) 60 capsule 5   . Elastic Bandages & Supports (COMFORT FIT MATERNITY SUPP SM) MISC Wear as directed. (Patient not taking: Reported on 05/14/2019) 1 each 0   . famotidine (PEPCID) 20 MG tablet Take 1 tablet (20 mg total) by mouth 2 (two) times daily. 60 tablet 3   . metoCLOPramide (REGLAN) 10 MG tablet Take 1 tablet (10 mg total) by mouth every 6 (six) hours as needed for nausea (Or headache). (Patient not taking: Reported on 02/20/2019) 30 tablet 1   . polyethylene glycol (MIRALAX) 17 g packet Take 17 g by mouth at bedtime as needed for mild constipation or moderate constipation. (Patient not taking: Reported on 03/19/2019) 30 each 5   . Prenatal MV-Min-FA-Omega-3 (PRENATAL GUMMIES/DHA & FA) 0.4-32.5 MG CHEW Chew by mouth.     . promethazine (PHENERGAN) 25 MG tablet Take 1 tablet (25 mg total) by mouth every 6 (six) hours as needed for nausea or vomiting. (Patient not taking:  Reported on 01/07/2019) 30 tablet 0     ROS:  Review of Systems  Constitutional: Negative for chills, fatigue and fever.  Eyes: Negative for visual disturbance.  Respiratory: Negative for shortness of breath.   Cardiovascular: Negative for chest pain.  Gastrointestinal: Positive for abdominal pain. Negative for nausea and vomiting.  Genitourinary: Negative for difficulty urinating, dysuria, flank pain, pelvic pain, vaginal bleeding, vaginal discharge and vaginal pain.  Neurological: Positive for headaches. Negative for dizziness.  Psychiatric/Behavioral: Negative.      I have reviewed patient's Past Medical Hx, Surgical Hx, Family Hx, Social Hx, medications and allergies.   Physical Exam   Patient Vitals for the past 24 hrs:  BP Temp Temp src Pulse Resp SpO2 Height Weight   05/24/19 1830 131/82 -- -- 95 -- -- -- --  05/24/19 1815 135/89 -- -- 91 -- -- -- --  05/24/19 1800 136/87 -- -- 90 -- -- -- --  05/24/19 1745 (!) 142/94 -- -- 89 -- 99 % -- --  05/24/19 1730 (!) 142/90 -- -- 88 -- 99 % -- --  05/24/19 1715 (!) 151/100 -- -- 92 -- 99 % -- --  05/24/19 1645 (!) 145/96 -- -- 94 -- 98 % -- --  05/24/19 1632 (!) 149/99 -- -- 98 -- 99 % -- --  05/24/19 1628 (!) 151/100 -- -- 98 -- 98 % -- --  05/24/19 1601 (!) 158/88 98.9 F (37.2 C) Oral 97 16 99 % -- --  05/24/19 1557 -- -- -- -- -- -- '5\' 8"'$  (1.727 m) 112.4 kg   Constitutional: Well-developed, well-nourished female in no acute distress.  HEART: normal rate, heart sounds, regular rhythm RESP: normal effort, lung sounds clear and equal bilaterally GI: Abd soft, non-tender, gravid appropriate for gestational age.  MS: Extremities nontender, no edema, normal ROM Neurologic: Alert and oriented x 4.  GU: Neg CVAT.  PELVIC EXAM: Cervix pink, visually closed, without lesion, small amount thin slightly frothy discharge, vaginal walls and external genitalia normal  Dilation: Fingertip Effacement (%): Thick Cervical Position: Posterior Station: Ballotable Exam by:: Danelle Berry, CNM  FHT:  Baseline 155 , moderate variability, accelerations present, no decelerations Contractions: None on toco or to palpation   Labs: Results for orders placed or performed during the hospital encounter of 05/24/19 (from the past 24 hour(s))  Urinalysis, Routine w reflex microscopic     Status: Abnormal   Collection Time: 05/24/19  3:49 PM  Result Value Ref Range   Color, Urine AMBER (A) YELLOW   APPearance HAZY (A) CLEAR   Specific Gravity, Urine 1.028 1.005 - 1.030   pH 5.0 5.0 - 8.0   Glucose, UA NEGATIVE NEGATIVE mg/dL   Hgb urine dipstick NEGATIVE NEGATIVE   Bilirubin Urine NEGATIVE NEGATIVE   Ketones, ur NEGATIVE NEGATIVE mg/dL   Protein, ur 30 (A) NEGATIVE mg/dL   Nitrite NEGATIVE NEGATIVE   Leukocytes,Ua SMALL  (A) NEGATIVE   RBC / HPF 0-5 0 - 5 RBC/hpf   WBC, UA 6-10 0 - 5 WBC/hpf   Bacteria, UA NONE SEEN NONE SEEN   Squamous Epithelial / LPF 11-20 0 - 5   Mucus PRESENT    Hyaline Casts, UA PRESENT    Ca Oxalate Crys, UA PRESENT   Protein / creatinine ratio, urine     Status: None   Collection Time: 05/24/19  3:49 PM  Result Value Ref Range   Creatinine, Urine 360.11 mg/dL   Total Protein, Urine 26 mg/dL   Protein Creatinine  Ratio 0.07 0.00 - 0.15 mg/mg[Cre]  Wet prep, genital     Status: Abnormal   Collection Time: 05/24/19  5:04 PM   Specimen: Cervix; Genital  Result Value Ref Range   Yeast Wet Prep HPF POC NONE SEEN NONE SEEN   Trich, Wet Prep NONE SEEN NONE SEEN   Clue Cells Wet Prep HPF POC PRESENT (A) NONE SEEN   WBC, Wet Prep HPF POC MANY (A) NONE SEEN   Sperm NONE SEEN   Fetal fibronectin     Status: None   Collection Time: 05/24/19  5:04 PM  Result Value Ref Range   Fetal Fibronectin NEGATIVE NEGATIVE  CBC     Status: Abnormal   Collection Time: 05/24/19  6:00 PM  Result Value Ref Range   WBC 6.9 4.0 - 10.5 K/uL   RBC 3.62 (L) 3.87 - 5.11 MIL/uL   Hemoglobin 11.2 (L) 12.0 - 15.0 g/dL   HCT 34.1 (L) 36.0 - 46.0 %   MCV 94.2 80.0 - 100.0 fL   MCH 30.9 26.0 - 34.0 pg   MCHC 32.8 30.0 - 36.0 g/dL   RDW 12.7 11.5 - 15.5 %   Platelets 245 150 - 400 K/uL   nRBC 0.0 0.0 - 0.2 %  Comprehensive metabolic panel     Status: Abnormal   Collection Time: 05/24/19  6:00 PM  Result Value Ref Range   Sodium 136 135 - 145 mmol/L   Potassium 3.8 3.5 - 5.1 mmol/L   Chloride 102 98 - 111 mmol/L   CO2 23 22 - 32 mmol/L   Glucose, Bld 93 70 - 99 mg/dL   BUN 8 6 - 20 mg/dL   Creatinine, Ser 0.75 0.44 - 1.00 mg/dL   Calcium 9.3 8.9 - 10.3 mg/dL   Total Protein 6.3 (L) 6.5 - 8.1 g/dL   Albumin 2.9 (L) 3.5 - 5.0 g/dL   AST 16 15 - 41 U/L   ALT 12 0 - 44 U/L   Alkaline Phosphatase 55 38 - 126 U/L   Total Bilirubin 0.5 0.3 - 1.2 mg/dL   GFR calc non Af Amer >60 >60 mL/min   GFR calc Af  Amer >60 >60 mL/min   Anion gap 11 5 - 15   O/Positive/-- (08/13 1132)  Imaging:  No results found.  MAU Course/MDM: Orders Placed This Encounter  Procedures  . Wet prep, genital  . Urinalysis, Routine w reflex microscopic  . Fetal fibronectin  . CBC  . Comprehensive metabolic panel  . Protein / creatinine ratio, urine  . Discharge patient    Meds ordered this encounter  Medications  . lactated ringers bolus 1,000 mL  . metoCLOPramide (REGLAN) injection 10 mg  . dexamethasone (DECADRON) injection 10 mg  . metroNIDAZOLE (FLAGYL) 500 MG tablet    Sig: Take 1 tablet (500 mg total) by mouth 2 (two) times daily for 7 days.    Dispense:  14 tablet    Refill:  0    Order Specific Question:   Supervising Provider    Answer:   Donnamae Jude [[id number]]     NST reviewed and appropriate for gestational age Headache cocktail with IV LR , Reglan, Decadron given. No  Benadryl as pt is driving.  Pt headache resolved. PEC labs wnl with P/C ratio 0.7 No evidence of preterm labor with negative FFN and no cervical change in 2 hours in MAU Vaginal d/c and pt presentation c/w BV so will treat with Flagyl  Discharge BP elevated,  Pt to take scheduled labetalol tonight.  BP check on Monday with [facility]. PEC precautions reviewed.  Keep next appt at [facility], return to MAU as needed for signs of labor or emergencies.   Assessment: 1. Threatened preterm labor, third trimester   2. Chronic hypertension affecting pregnancy   3. Headache in pregnancy, third trimester   4. Bacterial vaginosis     Plan: Discharge home Labor precautions and fetal kick counts Follow-up Information    Onset Follow up.   Why: BP check on Monday, can be virtual.  The office will call you with appt. Return to MAU as needed for emergencies.  Contact information: [place] [address] [phone]         Allergies as of 05/24/2019      Reactions    Vicodin [hydrocodone-acetaminophen] Itching      Medication List    TAKE these medications   Blood Pressure Monitoring Kit 1 kit by Does not apply route once a week.   Fraser Supp Sm Misc Wear as directed.   docusate sodium 100 MG capsule Commonly known as: COLACE Take 1 capsule (100 mg total) by mouth 2 (two) times daily as needed.   famotidine 20 MG tablet Commonly known as: PEPCID Take 1 tablet (20 mg total) by mouth 2 (two) times daily.   labetalol 300 MG tablet Commonly known as: NORMODYNE Take 2 tablets (600 mg total) by mouth 2 (two) times daily.   metoCLOPramide 10 MG tablet Commonly known as: REGLAN Take 1 tablet (10 mg total) by mouth every 6 (six) hours as needed for nausea (Or headache).   metroNIDAZOLE 500 MG tablet Commonly known as: FLAGYL Take 1 tablet (500 mg total) by mouth 2 (two) times daily for 7 days.   polyethylene glycol 17 g packet Commonly known as: MiraLax Take 17 g by mouth at bedtime as needed for mild constipation or moderate constipation.   Prenatal Gummies/DHA & FA 0.4-32.5 MG Chew Chew by mouth.   promethazine 25 MG tablet Commonly known as: PHENERGAN Take 1 tablet (25 mg total) by mouth every 6 (six) hours as needed for nausea or vomiting.       Fatima Blank Certified Nurse-Midwife 05/24/2019 8:40 PM

## 2019-05-26 ENCOUNTER — Telehealth: Payer: Medicaid Other

## 2019-05-26 LAB — GC/CHLAMYDIA PROBE AMP (~~LOC~~) NOT AT ARMC
Chlamydia: NEGATIVE
Comment: NEGATIVE
Comment: NORMAL
Neisseria Gonorrhea: NEGATIVE

## 2019-05-28 ENCOUNTER — Other Ambulatory Visit: Payer: Self-pay

## 2019-05-28 ENCOUNTER — Telehealth (INDEPENDENT_AMBULATORY_CARE_PROVIDER_SITE_OTHER): Payer: Medicaid Other | Admitting: Obstetrics and Gynecology

## 2019-05-28 ENCOUNTER — Ambulatory Visit (HOSPITAL_COMMUNITY): Payer: Medicaid Other | Admitting: *Deleted

## 2019-05-28 ENCOUNTER — Other Ambulatory Visit (HOSPITAL_COMMUNITY): Payer: Self-pay | Admitting: *Deleted

## 2019-05-28 ENCOUNTER — Encounter: Payer: Self-pay | Admitting: Obstetrics and Gynecology

## 2019-05-28 ENCOUNTER — Encounter (HOSPITAL_COMMUNITY): Payer: Self-pay

## 2019-05-28 ENCOUNTER — Ambulatory Visit (HOSPITAL_COMMUNITY)
Admission: RE | Admit: 2019-05-28 | Discharge: 2019-05-28 | Disposition: A | Discharge status: Home / Self Care | Payer: Medicaid Other | Source: Ambulatory Visit | Attending: Obstetrics and Gynecology | Admitting: Obstetrics and Gynecology

## 2019-05-28 VITALS — BP 152/102

## 2019-05-28 VITALS — BP 125/83 | HR 92 | Temp 96.7°F

## 2019-05-28 DIAGNOSIS — O10919 Unspecified pre-existing hypertension complicating pregnancy, unspecified trimester: Secondary | ICD-10-CM

## 2019-05-28 DIAGNOSIS — O99212 Obesity complicating pregnancy, second trimester: Secondary | ICD-10-CM | POA: Diagnosis not present

## 2019-05-28 DIAGNOSIS — O10913 Unspecified pre-existing hypertension complicating pregnancy, third trimester: Secondary | ICD-10-CM

## 2019-05-28 DIAGNOSIS — O10012 Pre-existing essential hypertension complicating pregnancy, second trimester: Secondary | ICD-10-CM | POA: Diagnosis not present

## 2019-05-28 DIAGNOSIS — O3412 Maternal care for benign tumor of corpus uteri, second trimester: Secondary | ICD-10-CM | POA: Diagnosis not present

## 2019-05-28 DIAGNOSIS — Z3A28 28 weeks gestation of pregnancy: Secondary | ICD-10-CM | POA: Diagnosis not present

## 2019-05-28 DIAGNOSIS — O99332 Smoking (tobacco) complicating pregnancy, second trimester: Secondary | ICD-10-CM | POA: Diagnosis not present

## 2019-05-28 DIAGNOSIS — Z362 Encounter for other antenatal screening follow-up: Secondary | ICD-10-CM

## 2019-05-28 DIAGNOSIS — O09893 Supervision of other high risk pregnancies, third trimester: Secondary | ICD-10-CM

## 2019-05-28 DIAGNOSIS — O09899 Supervision of other high risk pregnancies, unspecified trimester: Secondary | ICD-10-CM

## 2019-05-28 DIAGNOSIS — D259 Leiomyoma of uterus, unspecified: Secondary | ICD-10-CM | POA: Diagnosis not present

## 2019-05-28 DIAGNOSIS — Z3A29 29 weeks gestation of pregnancy: Secondary | ICD-10-CM

## 2019-05-28 NOTE — Progress Notes (Signed)
   TELEHEALTH OBSTETRICS PRENATAL VIRTUAL VIDEO VISIT ENCOUNTER NOTE  Provider location: [facility] [place]   I connected with Terri [name] on 05/28/19 at  9:30 AM EST by MyChart Video Encounter at home and verified that I am speaking with the correct person using two identifiers.   I discussed the limitations, risks, security and privacy concerns of performing an evaluation and management service virtually and the availability of in person appointments. I also discussed with the patient that there may be a patient responsible charge related to this service. The patient expressed understanding and agreed to proceed. Subjective:  Terri [name] is a 33 y.o. G2P1001 at [redacted]w[redacted]d being seen today for ongoing prenatal care.  She is currently monitored for the following issues for this high-risk pregnancy and has Obesity; TOBACCO USER; ASTHMA, INTERMITTENT; Unspecified episodic mood disorder; Nephrolithiasis; Mixed incontinence; Migraine; Supervision of other high risk pregnancy, antepartum; and Chronic hypertension affecting pregnancy on their problem list.  Patient reports vaginal pressure and pain.  Contractions: Irregular.  .  Movement: Present. Denies any leaking of fluid.   The following portions of the patient's history were reviewed and updated as appropriate: allergies, current medications, past family history, past medical history, past social history, past surgical history and problem list.   Objective:   Vitals:   05/28/19 0924  BP: (!) 152/102    Fetal Status:     Movement: Present     General:  Alert, oriented and cooperative. Patient is in no acute distress.  Respiratory: Normal respiratory effort, no problems with respiration noted  Mental Status: Normal mood and affect. Normal behavior. Normal judgment and thought content.  Rest of physical exam deferred due to type of encounter  Imaging: No results found.  Assessment and Plan:  Pregnancy: G2P1001 at  [redacted]w[redacted]d  1. Supervision of other high risk pregnancy, antepartum Declines contraception  2. Chronic hypertension affecting pregnancy BP elevated this am but patient had not taken her am meds yet Cont labetalol 600 mg BID To start putting BP into baby scripts Has f/u growth [place] today  Preterm labor symptoms and general obstetric precautions including but not limited to vaginal bleeding, contractions, leaking of fluid and fetal movement were reviewed in detail with the patient. I discussed the assessment and treatment plan with the patient. The patient was provided an opportunity to ask questions and all were answered. The patient agreed with the plan and demonstrated an understanding of the instructions. The patient was advised to call back or seek an in-person office evaluation/go to MAU at [facility] for any urgent or concerning symptoms. Please refer to After Visit Summary for other counseling recommendations.   I provided 15 minutes of face-to-face time during this encounter.  Return in about 2 weeks (around 06/11/2019) for high OB, virtual.  Future Appointments  Date Time Provider [place]  05/28/2019 10:45 AM WH-MFC [place] 2 WH-MFCUS MFC-US    Dominic Mahaney M Tommie Bohlken, [place] [facility] [place], [place]

## 2019-05-29 ENCOUNTER — Other Ambulatory Visit (HOSPITAL_COMMUNITY): Payer: Self-pay | Admitting: Obstetrics and Gynecology

## 2019-05-29 DIAGNOSIS — O10919 Unspecified pre-existing hypertension complicating pregnancy, unspecified trimester: Secondary | ICD-10-CM

## 2019-06-13 NOTE — L&D Delivery Note (Addendum)
OB/GYN Faculty Practice Delivery Note  Terri [name] is a 34 y.o. G2P1001 s/p precipitous SVD at [redacted]w[redacted]d. She was admitted for IOL 2/2 CHTN w/ SI severe PEC (BP, HA).  ROM: AROM 0400 with clear/bloody fluid GBS Status:  Negative/-- (02/04 0114)  Labor Progress: . Patient presented to L&D for induction of labor secondary to chronic hypertension with superimposed severe PEC (BP). Induced with misoprostol, pitocin, and AROM.    Delivery Date/Time:  Delivery: Called to room and patient was complete, pushing and delivered with ease over the perineum into the nurses hands. Head position was LOA . No nuchal cord present. Shoulder and body delivered in usual fashion. Infant with spontaneous cry, placed on mother's abdomen, dried and stimulated.  Abnormal breathing pattern and occasional retractions were noted so cord clamped x 2 after several minutes delay per patient request, and cut by the physician.  The infant was then transported to the warmer and assessed by respiratory therapy. Cord blood drawn. Placenta delivered spontaneously with gentle cord traction. Fundus firm with massage and pitocin started. Labia, perineum, vagina, and cervix inspected and significant for bilateral first-degree periurethral laceration, the left side repaired with 4-0 Monocryl suture and the right side hemostatic not requiring repair.  There was also a first-degree perineal laceration repaired with 3-0 Vicryl suture. Due to ongoing trickling patient was given 428mcg buccal misoprostol after delivery.   Baby Weight: 2778 grams  Cord: central insertion, 3 vessel Placenta: Sent to L&D Complications: None Lacerations: Bilateral periurethral as well as first-degree perineal.  The left periurethral and perineal lacerations were repaired with 4-0 Monocryl and 3-0 Vicryl respectively. EBL: 811cc Analgesia: IV Fenatnyl  Infant: APGAR (1 MIN): 6   APGAR (5 MINS): Mylo, DO, PGY-1 OBGYN Faculty Teaching Service   07/23/2019, 7:25 AM    OB FELLOW ATTESTATION  As resident was notifying me that patient was 7-8cm by phone patient delivered precipitously. I entered room and infant still attached to cord. Supervised cord clamping, placenta delivery, and repair of laceration.   Augustin Coupe, MD/[facility] OB Fellow  07/23/2019, 8:35 AM

## 2019-06-17 ENCOUNTER — Other Ambulatory Visit: Payer: Self-pay

## 2019-06-17 ENCOUNTER — Encounter (HOSPITAL_COMMUNITY): Payer: Self-pay | Admitting: Family Medicine

## 2019-06-17 ENCOUNTER — Inpatient Hospital Stay (HOSPITAL_COMMUNITY)
Admission: AD | Admit: 2019-06-17 | Discharge: 2019-06-17 | Disposition: A | Discharge status: Home / Self Care | Payer: Medicaid Other | Attending: Family Medicine | Admitting: Family Medicine

## 2019-06-17 DIAGNOSIS — Z87891 Personal history of nicotine dependence: Secondary | ICD-10-CM | POA: Insufficient documentation

## 2019-06-17 DIAGNOSIS — O368130 Decreased fetal movements, third trimester, not applicable or unspecified: Secondary | ICD-10-CM | POA: Diagnosis not present

## 2019-06-17 DIAGNOSIS — O99891 Other specified diseases and conditions complicating pregnancy: Secondary | ICD-10-CM | POA: Insufficient documentation

## 2019-06-17 DIAGNOSIS — O10919 Unspecified pre-existing hypertension complicating pregnancy, unspecified trimester: Secondary | ICD-10-CM

## 2019-06-17 DIAGNOSIS — R110 Nausea: Secondary | ICD-10-CM | POA: Insufficient documentation

## 2019-06-17 DIAGNOSIS — Z885 Allergy status to narcotic agent status: Secondary | ICD-10-CM | POA: Diagnosis not present

## 2019-06-17 DIAGNOSIS — Z3A32 32 weeks gestation of pregnancy: Secondary | ICD-10-CM | POA: Insufficient documentation

## 2019-06-17 DIAGNOSIS — O163 Unspecified maternal hypertension, third trimester: Secondary | ICD-10-CM | POA: Insufficient documentation

## 2019-06-17 DIAGNOSIS — R5383 Other fatigue: Secondary | ICD-10-CM | POA: Diagnosis not present

## 2019-06-17 DIAGNOSIS — K219 Gastro-esophageal reflux disease without esophagitis: Secondary | ICD-10-CM

## 2019-06-17 DIAGNOSIS — Z3689 Encounter for other specified antenatal screening: Secondary | ICD-10-CM | POA: Diagnosis not present

## 2019-06-17 DIAGNOSIS — Z8249 Family history of ischemic heart disease and other diseases of the circulatory system: Secondary | ICD-10-CM | POA: Insufficient documentation

## 2019-06-17 DIAGNOSIS — O10913 Unspecified pre-existing hypertension complicating pregnancy, third trimester: Secondary | ICD-10-CM | POA: Diagnosis not present

## 2019-06-17 DIAGNOSIS — J45909 Unspecified asthma, uncomplicated: Secondary | ICD-10-CM | POA: Diagnosis not present

## 2019-06-17 DIAGNOSIS — Z809 Family history of malignant neoplasm, unspecified: Secondary | ICD-10-CM | POA: Insufficient documentation

## 2019-06-17 DIAGNOSIS — O99513 Diseases of the respiratory system complicating pregnancy, third trimester: Secondary | ICD-10-CM | POA: Diagnosis not present

## 2019-06-17 LAB — URINALYSIS, ROUTINE W REFLEX MICROSCOPIC
Bacteria, UA: NONE SEEN
Bilirubin Urine: NEGATIVE
Glucose, UA: NEGATIVE mg/dL
Hgb urine dipstick: NEGATIVE
Ketones, ur: NEGATIVE mg/dL
Leukocytes,Ua: NEGATIVE
Nitrite: NEGATIVE
Protein, ur: 30 mg/dL — AB
Specific Gravity, Urine: 1.028 (ref 1.005–1.030)
pH: 6 (ref 5.0–8.0)

## 2019-06-17 LAB — COMPREHENSIVE METABOLIC PANEL
ALT: 16 U/L (ref 0–44)
AST: 16 U/L (ref 15–41)
Albumin: 2.7 g/dL — ABNORMAL LOW (ref 3.5–5.0)
Alkaline Phosphatase: 72 U/L (ref 38–126)
Anion gap: 10 (ref 5–15)
BUN: 7 mg/dL (ref 6–20)
CO2: 22 mmol/L (ref 22–32)
Calcium: 8.8 mg/dL — ABNORMAL LOW (ref 8.9–10.3)
Chloride: 104 mmol/L (ref 98–111)
Creatinine, Ser: 0.75 mg/dL (ref 0.44–1.00)
GFR calc Af Amer: >60 mL/min (ref >60)
GFR calc non Af Amer: >60 mL/min (ref >60)
Glucose, Bld: 95 mg/dL (ref 70–99)
Potassium: 3.7 mmol/L (ref 3.5–5.1)
Sodium: 136 mmol/L (ref 135–145)
Total Bilirubin: 0.3 mg/dL (ref 0.3–1.2)
Total Protein: 6.2 g/dL — ABNORMAL LOW (ref 6.5–8.1)

## 2019-06-17 LAB — CBC WITH DIFFERENTIAL/PLATELET
Abs Immature Granulocytes: 0.01 10*3/uL (ref 0.00–0.07)
Basophils Absolute: 0 10*3/uL (ref 0.0–0.1)
Basophils Relative: 0 %
Eosinophils Absolute: 0 10*3/uL (ref 0.0–0.5)
Eosinophils Relative: 1 %
HCT: 32.7 % — ABNORMAL LOW (ref 36.0–46.0)
Hemoglobin: 11 g/dL — ABNORMAL LOW (ref 12.0–15.0)
Immature Granulocytes: 0 %
Lymphocytes Relative: 30 %
Lymphs Abs: 1.8 10*3/uL (ref 0.7–4.0)
MCH: 31 pg (ref 26.0–34.0)
MCHC: 33.6 g/dL (ref 30.0–36.0)
MCV: 92.1 fL (ref 80.0–100.0)
Monocytes Absolute: 0.7 10*3/uL (ref 0.1–1.0)
Monocytes Relative: 11 %
Neutro Abs: 3.4 10*3/uL (ref 1.7–7.7)
Neutrophils Relative %: 58 %
Platelets: 241 10*3/uL (ref 150–400)
RBC: 3.55 MIL/uL — ABNORMAL LOW (ref 3.87–5.11)
RDW: 12.5 % (ref 11.5–15.5)
WBC: 5.9 10*3/uL (ref 4.0–10.5)
nRBC: 0 % (ref 0.0–0.2)

## 2019-06-17 MED ORDER — ONDANSETRON 4 MG PO TBDP
4.0000 mg | ORAL_TABLET | Freq: Four times a day (QID) | ORAL | Status: DC | PRN
  Administered 2019-06-17: 4 mg via ORAL
  Filled 2019-06-17: qty 1

## 2019-06-17 MED ORDER — ONDANSETRON 4 MG PO TBDP: 4.0000 mg | ORAL_TABLET | Freq: Four times a day (QID) | ORAL | 0 refills | Status: DC | PRN

## 2019-06-17 MED ORDER — FAMOTIDINE 20 MG PO TABS
20.0000 mg | ORAL_TABLET | Freq: Once | ORAL | Status: AC
  Administered 2019-06-17: 20 mg via ORAL
  Filled 2019-06-17: qty 1

## 2019-06-17 MED ORDER — LABETALOL HCL 100 MG PO TABS
300.0000 mg | ORAL_TABLET | Freq: Once | ORAL | Status: AC
  Administered 2019-06-17: 300 mg via ORAL
  Filled 2019-06-17: qty 3

## 2019-06-17 NOTE — MAU Provider Note (Signed)
Chief Complaint:  Hypertension   First Provider Initiated Contact with Patient 06/17/19 2121     HPI: Terri [name] is a 34 y.o. G2P1001 at 12w1dho presents to maternity admissions reporting nausea since Sunday.  Ate a sausage biscuit Sat and thinks it caused it. States she has been very tired and nauseated.  Did not take her morning dose of Labetalol until 3pm today.  Then took her BP and it was high. . She reports decreased fetal movement, denies LOF, vaginal bleeding, vaginal itching/burning, urinary symptoms, h/a, dizziness, n/v, diarrhea, constipation or fever/chills.  She denies headache, visual changes or RUQ abdominal pain.  Hypertension This is a recurrent problem. The problem has been gradually improving since onset. Associated symptoms include malaise/fatigue. Pertinent negatives include no anxiety, blurred vision, chest pain, headaches or shortness of breath. There are no associated agents to hypertension. There are no known risk factors for coronary artery disease. Treatments tried: Labetalol. There are no compliance problems (except slept through morning dose).    PT SAYS NAUSEATED SINCE Sunday.  NO APPETITE. BP AT HOME 175/100. TAKES LABETALOL 300MG BID - HAS ONLY TAKEN  MORNING DOSE TODAY . USUALLY TAKES NIGHT DOSE BEFORE SHE GOES TO SLEEP .   SAYS DFM- TODAY- LAST TIME MOVEMENT - IS IN TRIAGE   Past Medical History: Past Medical History:  Diagnosis Date  . Asthma   . Hypertension   . Hypertention, malignant, with acute intensive management     Past obstetric history: OB History  Gravida Para Term Preterm AB Living  2 1 1     1   SAB TAB Ectopic Multiple Live Births          1    # Outcome Date GA Lbr Len/2nd Weight Sex Delivery Anes PTL Lv  2 Current           1 Term 02/17/05    M Vag-Spont EPI N LIV    Past Surgical History: History reviewed. No pertinent surgical history.  Family History: Family History  Problem Relation Age of Onset  . Hypertension Mother    . Cancer Maternal Grandfather   . Cancer Paternal Grandfather     Social History: Social History   Tobacco Use  . Smoking status: Former Smoker    Packs/day: 0.20    Types: Cigarettes    Quit date: 11/24/2018    Years since quitting: 0.5  . 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 Alcohol use: Not Currently    Comment: 11/2018  . Drug use: Not Currently    Types: Marijuana    Comment: 12/11/2018; previously used    Allergies:  Allergies  Allergen Reactions  . Vicodin [Hydrocodone-Acetaminophen] Itching    Meds:  Medications Prior to Admission  Medication Sig Dispense Refill Last Dose  . famotidine (PEPCID) 20 MG tablet Take 1 tablet (20 mg total) by mouth 2 (two) times daily. 60 tablet 3 Past Week at Unknown time  . labetalol (NORMODYNE) 300 MG tablet Take 2 tablets (600 mg total) by mouth 2 (two) times daily. 60 tablet 5 06/17/2019 at 1500  . Prenatal MV-Min-FA-Omega-3 (PRENATAL GUMMIES/DHA & FA) 0.4-32.5 MG CHEW Chew by mouth.   Past Week at Unknown time  . Blood Pressure Monitoring KIT 1 kit by Does not apply route once a week. (Patient not taking: Reported on 05/28/2019) 1 kit 0   . docusate sodium (COLACE) 100 MG capsule Take 1 capsule (100 mg total) by mouth 2 (  two) times daily as needed. (Patient not taking: Reported on 03/19/2019) 60 capsule 5   . Elastic Bandages & Supports (COMFORT FIT MATERNITY SUPP SM) MISC Wear as directed. (Patient not taking: Reported on 05/14/2019) 1 each 0   . metoCLOPramide (REGLAN) 10 MG tablet Take 1 tablet (10 mg total) by mouth every 6 (six) hours as needed for nausea (Or headache). (Patient not taking: Reported on 02/20/2019) 30 tablet 1   . polyethylene glycol (MIRALAX) 17 g packet Take 17 g by mouth at bedtime as needed for mild constipation or moderate constipation. (Patient not taking: Reported on 05/28/2019) 30 each 5   . promethazine (PHENERGAN) 25 MG tablet Take 1 tablet (25 mg total) by mouth every 6 (six)  hours as needed for nausea or vomiting. (Patient not taking: Reported on 01/07/2019) 30 tablet 0     I have reviewed patient's Past Medical Hx, Surgical Hx, Family Hx, Social Hx, medications and allergies.   ROS:  Review of Systems  Constitutional: Positive for malaise/fatigue.  Eyes: Negative for blurred vision.  Respiratory: Negative for shortness of breath.   Cardiovascular: Negative for chest pain.  Neurological: Negative for headaches.   Other systems negative  Physical Exam   Patient Vitals for the past 24 hrs:  BP Temp Pulse Resp Height Weight  06/17/19 2108 132/84 -- 95 -- -- --  06/17/19 2045 137/81 98.3 F (36.8 C) 94 20 5' 8"  (1.727 m) 113.5 kg   Constitutional: Well-developed, well-nourished female in no acute distress.  Cardiovascular: normal rate and rhythm Respiratory: normal effort, clear to auscultation bilaterally GI: Abd soft, non-tender, gravid appropriate for gestational age.   No rebound or guarding. MS: Extremities nontender, no edema, normal ROM Neurologic: Alert and oriented x 4.  GU: Neg CVAT.  PELVIC EXAM: deferred  FHT:  Baseline 140 , moderate variability, accelerations present, no decelerations Contractions: Occasional     Labs: Results for orders placed or performed during the hospital encounter of 06/17/19 (from the past 24 hour(s))  Urinalysis, Routine w reflex microscopic     Status: Abnormal   Collection Time: 06/17/19  9:03 PM  Result Value Ref Range   Color, Urine AMBER (A) YELLOW   APPearance HAZY (A) CLEAR   Specific Gravity, Urine 1.028 1.005 - 1.030   pH 6.0 5.0 - 8.0   Glucose, UA NEGATIVE NEGATIVE mg/dL   Hgb urine dipstick NEGATIVE NEGATIVE   Bilirubin Urine NEGATIVE NEGATIVE   Ketones, ur NEGATIVE NEGATIVE mg/dL   Protein, ur 30 (A) NEGATIVE mg/dL   Nitrite NEGATIVE NEGATIVE   Leukocytes,Ua NEGATIVE NEGATIVE   RBC / HPF 0-5 0 - 5 RBC/hpf   WBC, UA 0-5 0 - 5 WBC/hpf   Bacteria, UA NONE SEEN NONE SEEN   Squamous  Epithelial / LPF 6-10 0 - 5   Mucus PRESENT    Ca Oxalate Crys, UA PRESENT   CBC with Differential     Status: Abnormal   Collection Time: 06/17/19 10:06 PM  Result Value Ref Range   WBC 5.9 4.0 - 10.5 K/uL   RBC 3.55 (L) 3.87 - 5.11 MIL/uL   Hemoglobin 11.0 (L) 12.0 - 15.0 g/dL   HCT 32.7 (L) 36.0 - 46.0 %   MCV 92.1 80.0 - 100.0 fL   MCH 31.0 26.0 - 34.0 pg   MCHC 33.6 30.0 - 36.0 g/dL   RDW 12.5 11.5 - 15.5 %   Platelets 241 150 - 400 K/uL   nRBC 0.0 0.0 - 0.2 %  Neutrophils Relative % 58 %   Neutro Abs 3.4 1.7 - 7.7 K/uL   Lymphocytes Relative 30 %   Lymphs Abs 1.8 0.7 - 4.0 K/uL   Monocytes Relative 11 %   Monocytes Absolute 0.7 0.1 - 1.0 K/uL   Eosinophils Relative 1 %   Eosinophils Absolute 0.0 0.0 - 0.5 K/uL   Basophils Relative 0 %   Basophils Absolute 0.0 0.0 - 0.1 K/uL   Immature Granulocytes 0 %   Abs Immature Granulocytes 0.01 0.00 - 0.07 K/uL  Comprehensive metabolic panel     Status: Abnormal   Collection Time: 06/17/19 10:06 PM  Result Value Ref Range   Sodium 136 135 - 145 mmol/L   Potassium 3.7 3.5 - 5.1 mmol/L   Chloride 104 98 - 111 mmol/L   CO2 22 22 - 32 mmol/L   Glucose, Bld 95 70 - 99 mg/dL   BUN 7 6 - 20 mg/dL   Creatinine, Ser 0.75 0.44 - 1.00 mg/dL   Calcium 8.8 (L) 8.9 - 10.3 mg/dL   Total Protein 6.2 (L) 6.5 - 8.1 g/dL   Albumin 2.7 (L) 3.5 - 5.0 g/dL   AST 16 15 - 41 U/L   ALT 16 0 - 44 U/L   Alkaline Phosphatase 72 38 - 126 U/L   Total Bilirubin 0.3 0.3 - 1.2 mg/dL   GFR calc non Af Amer >60 >60 mL/min   GFR calc Af Amer >60 >60 mL/min   Anion gap 10 5 - 15    O/Positive/-- (08/13 1132)  Imaging:    MAU Course/MDM: I have ordered labs and reviewed results. These are normal.  No leukocytosis.   NST reviewed and is reactive.  Offered IV hydration with meds.  And she declined the IV but did take the zofran and Pepcid Felt better after zofran but had heartburn and requested med for that.   Suspect this is probably a viral  syndrome.  Never had diarrhea so doubt food poisoning.   Assessment: Single intrauterine pregnancy at 97w1dNausea Fatigue Decreased fetal movement Reactive nonstress test  Plan: Discharge home Supportive care Rx Zofran for prn use at home for nausea Advance diet as tolerated Preterm Labor precautions and fetal kick counts Follow up in Office for prenatal visits and recheck of status.  Has appt this Thursday  Encouraged to return here or to other Urgent Care/ED if she develops worsening of symptoms, increase in pain, fever, or other concerning symptoms.   Pt stable at time of discharge.  MHansel FeinsteinCNM, MSN Certified Nurse-Midwife 06/17/2019 9:21 PM

## 2019-06-17 NOTE — MAU Note (Signed)
PT SAYS NAUSEATED SINCE Sunday.  NO APPETITE. BP AT HOME 175/100. TAKES LABETALOL 300MG  BID - HAS ONLY TAKEN  MORNING DOSE TODAY . USUALLY TAKES NIGHT DOSE BEFORE SHE GOES TO SLEEP .   SAYS DFM- TODAY- LAST TIME MOVEMENT - IS IN TRIAGE

## 2019-06-17 NOTE — Discharge Instructions (Signed)
Gastroesophageal Reflux Disease, Adult Gastroesophageal reflux (GER) happens when acid from the stomach flows up into the tube that connects the mouth and the stomach (esophagus). Normally, food travels down the esophagus and stays in the stomach to be digested. However, when a person has GER, food and stomach acid sometimes move back up into the esophagus. If this becomes a more serious problem, the person may be diagnosed with a disease called gastroesophageal reflux disease (GERD). GERD occurs when the reflux:  Happens often.  Causes frequent or severe symptoms.  Causes problems such as damage to the esophagus. When stomach acid comes in contact with the esophagus, the acid may cause soreness (inflammation) in the esophagus. Over time, GERD may create small holes (ulcers) in the lining of the esophagus. What are the causes? This condition is caused by a problem with the muscle between the esophagus and the stomach (lower esophageal sphincter, or LES). Normally, the LES muscle closes after food passes through the esophagus to the stomach. When the LES is weakened or abnormal, it does not close properly, and that allows food and stomach acid to go back up into the esophagus. The LES can be weakened by certain dietary substances, medicines, and medical conditions, including:  Tobacco use.  Pregnancy.  Having a hiatal hernia.  Alcohol use.  Certain foods and beverages, such as coffee, chocolate, onions, and peppermint. What increases the risk? You are more likely to develop this condition if you:  Have an increased body weight.  Have a connective tissue disorder.  Use NSAID medicines. What are the signs or symptoms? Symptoms of this condition include:  Heartburn.  Difficult or painful swallowing.  The feeling of having a lump in the throat.  Abitter taste in the mouth.  Bad breath.  Having a large amount of saliva.  Having an upset or bloated  stomach.  Belching.  Chest pain. Different conditions can cause chest pain. Make sure you see your health care provider if you experience chest pain.  Shortness of breath or wheezing.  Ongoing (chronic) cough or a night-time cough.  Wearing away of tooth enamel.  Weight loss. How is this diagnosed? Your health care provider will take a medical history and perform a physical exam. To determine if you have mild or severe GERD, your health care provider may also monitor how you respond to treatment. You may also have tests, including:  A test to examine your stomach and esophagus with a small camera (endoscopy).  A test thatmeasures the acidity level in your esophagus.  A test thatmeasures how much pressure is on your esophagus.  A barium swallow or modified barium swallow test to show the shape, size, and functioning of your esophagus. How is this treated? The goal of treatment is to help relieve your symptoms and to prevent complications. Treatment for this condition may vary depending on how severe your symptoms are. Your health care provider may recommend:  Changes to your diet.  Medicine.  Surgery. Follow these instructions at home: Eating and drinking   Follow a diet as recommended by your health care provider. This may involve avoiding foods and drinks such as: ? Coffee and tea (with or without caffeine). ? Drinks that containalcohol. ? Energy drinks and sports drinks. ? Carbonated drinks or sodas. ? Chocolate and cocoa. ? Peppermint and mint flavorings. ? Garlic and onions. ? Horseradish. ? Spicy and acidic foods, including peppers, chili powder, curry powder, vinegar, hot sauces, and barbecue sauce. ? Citrus fruit juices and citrus   fruits, such as oranges, lemons, and limes. ? Tomato-based foods, such as red sauce, chili, salsa, and pizza with red sauce. ? Fried and fatty foods, such as donuts, french fries, potato chips, and high-fat dressings. ? High-fat  meats, such as hot dogs and fatty cuts of red and white meats, such as rib eye steak, sausage, ham, and bacon. ? High-fat dairy items, such as whole milk, butter, and cream cheese.  Eat small, frequent meals instead of large meals.  Avoid drinking large amounts of liquid with your meals.  Avoid eating meals during the 2-3 hours before bedtime.  Avoid lying down right after you eat.  Do not exercise right after you eat. Lifestyle   Do not use any products that contain nicotine or tobacco, such as cigarettes, e-cigarettes, and chewing tobacco. If you need help quitting, ask your health care provider.  Try to reduce your stress by using methods such as yoga or meditation. If you need help reducing stress, ask your health care provider.  If you are overweight, reduce your weight to an amount that is healthy for you. Ask your health care provider for guidance about a safe weight loss goal. General instructions  Pay attention to any changes in your symptoms.  Take over-the-counter and prescription medicines only as told by your health care provider. Do not take aspirin, ibuprofen, or other NSAIDs unless your health care provider told you to do so.  Wear loose-fitting clothing. Do not wear anything tight around your waist that causes pressure on your abdomen.  Raise (elevate) the head of your bed about 6 inches (15 cm).  Avoid bending over if this makes your symptoms worse.  Keep all follow-up visits as told by your health care provider. This is important. Contact a health care provider if:  You have: ? New symptoms. ? Unexplained weight loss. ? Difficulty swallowing or it hurts to swallow. ? Wheezing or a persistent cough. ? A hoarse voice.  Your symptoms do not improve with treatment. Get help right away if you:  Have pain in your arms, neck, jaw, teeth, or back.  Feel sweaty, dizzy, or light-headed.  Have chest pain or shortness of breath.  Vomit and your vomit looks  like blood or coffee grounds.  Faint.  Have stool that is bloody or black.  Cannot swallow, drink, or eat. Summary  Gastroesophageal reflux happens when acid from the stomach flows up into the esophagus. GERD is a disease in which the reflux happens often, causes frequent or severe symptoms, or causes problems such as damage to the esophagus.  Treatment for this condition may vary depending on how severe your symptoms are. Your health care provider may recommend diet and lifestyle changes, medicine, or surgery.  Contact a health care provider if you have new or worsening symptoms.  Take over-the-counter and prescription medicines only as told by your health care provider. Do not take aspirin, ibuprofen, or other NSAIDs unless your health care provider told you to do so.  Keep all follow-up visits as told by your health care provider. This is important. This information is not intended to replace advice given to you by your health care provider. Make sure you discuss any questions you have with your health care provider. Document Revised: 12/05/2017 Document Reviewed: 12/05/2017 Elsevier Patient Education  [place].  Hypertension During Pregnancy Hypertension is also called high blood pressure. High blood pressure means that the force of your blood moving in your body is too strong. It can  cause problems for you and your baby. Different types of high blood pressure can happen during pregnancy. The types are:  High blood pressure before you got pregnant. This is called chronic hypertension.  This can continue during your pregnancy. Your doctor will want to keep checking your blood pressure. You may need medicine to keep your blood pressure under control while you are pregnant. You will need follow-up visits after you have your baby.  High blood pressure that goes up during pregnancy when it was normal before. This is called gestational hypertension. It will usually get better after  you have your baby, but your doctor will need to watch your blood pressure to make sure that it is getting better.  Very high blood pressure during pregnancy. This is called preeclampsia. Very high blood pressure is an emergency that needs to be checked and treated right away.  You may develop very high blood pressure after giving birth. This is called postpartum preeclampsia. This usually occurs within 48 hours after childbirth but may occur up to 6 weeks after giving birth. This is rare. How does this affect me? If you have high blood pressure during pregnancy, you have a higher chance of developing high blood pressure:  As you get older.  If you get pregnant again. In some cases, high blood pressure during pregnancy can cause:  Stroke.  Heart attack.  Damage to the kidneys, lungs, or liver.  Preeclampsia.  Jerky movements you cannot control (convulsions or seizures).  Problems with the placenta. How does this affect my baby? Your baby may:  Be born early.  Not weigh as much as he or she should.  Not handle labor well, leading to a c-section birth. What are the risks?  Having high blood pressure during a past pregnancy.  Being overweight.  Being 12 years old or older.  Being pregnant for the first time.  Being pregnant with more than one baby.  Becoming pregnant using fertility methods, such as IVF.  Having other problems, such as diabetes, or kidney disease.  Having family members who have high blood pressure. What can I do to lower my risk?   Keep a healthy weight.  Eat a healthy diet.  Follow what your doctor tells you about treating any medical problems that you had before becoming pregnant. It is very important to go to all of your doctor visits. Your doctor will check your blood pressure and make sure that your pregnancy is progressing as it should. Treatment should start early if a problem is found. How is this treated? Treatment for high blood  pressure during pregnancy can differ depending on the type of high blood pressure you have and how serious it is.  You may need to take blood pressure medicine.  If you have been taking medicine for your blood pressure, you may need to change the medicine during pregnancy if it is not safe for your baby.  If your doctor thinks that you could get very high blood pressure, he or she may tell you to take a low-dose aspirin during your pregnancy.  If you have very high blood pressure, you may need to stay in the hospital so you and your baby can be watched closely. You may also need to take medicine to lower your blood pressure. This medicine may be given by mouth or through an IV tube.  In some cases, if your condition gets worse, you may need to have your baby early. Follow these instructions at home: Eating  and drinking   Drink enough fluid to keep your pee (urine) pale yellow.  Avoid caffeine. Lifestyle  Do not use any products that contain nicotine or tobacco, such as cigarettes, e-cigarettes, and chewing tobacco. If you need help quitting, ask your doctor.  Do not use alcohol or drugs.  Avoid stress.  Rest and get plenty of sleep.  Regular exercise can help. Ask your doctor what kinds of exercise are best for you. General instructions  Take over-the-counter and prescription medicines only as told by your doctor.  Keep all prenatal and follow-up visits as told by your doctor. This is important. Contact a doctor if:  You have symptoms that your doctor told you to watch for, such as: ? Headaches. ? Nausea. ? Vomiting. ? Belly (abdominal) pain. ? Dizziness. ? Light-headedness. Get help right away if:  You have: ? Very bad belly pain that does not get better with treatment. ? A very bad headache that does not get better. ? Vomiting that does not get better. ? Sudden, fast weight gain. ? Sudden swelling in your hands, ankles, or face. ? Bleeding from your  vagina. ? Blood in your pee. ? Blurry vision. ? Double vision. ? Shortness of breath. ? Chest pain. ? Weakness on one side of your body. ? Trouble talking.  Your baby is not moving as much as usual. Summary  High blood pressure is also called hypertension.  High blood pressure means that the force of your blood moving in your body is too strong.  High blood pressure can cause problems for you and your baby.  Keep all follow-up visits as told by your doctor. This is important. This information is not intended to replace advice given to you by your health care provider. Make sure you discuss any questions you have with your health care provider. Document Revised: 09/19/2018 Document Reviewed: 06/25/2018 Elsevier Patient Education  [place].  Nausea, Adult Nausea is the feeling that you have an upset stomach or that you are about to vomit. Nausea on its own is not usually a serious concern, but it may be an early sign of a more serious medical problem. As nausea gets worse, it can lead to vomiting. If vomiting develops, or if you are not able to drink enough fluids, you are at risk of becoming dehydrated. Dehydration can make you tired and thirsty, cause you to have a dry mouth, and decrease how often you urinate. Older adults and people with other diseases or a weak disease-fighting system (immune system) are at higher risk for dehydration. The main goals of treating your nausea are:  To relieve your nausea.  To limit repeated nausea episodes.  To prevent vomiting and dehydration. Follow these instructions at home: Watch your symptoms for any changes. Tell your health care provider about them. Follow these instructions as told by your health care provider. Eating and drinking      Take an oral rehydration solution (ORS). This is a drink that is sold at pharmacies and retail stores.  Drink clear fluids slowly and in small amounts as you are able. Clear fluids include  water, ice chips, low-calorie sports drinks, and fruit juice that has water added (diluted fruit juice).  Eat bland, easy-to-digest foods in small amounts as you are able. These foods include bananas, applesauce, rice, lean meats, toast, and crackers.  Avoid drinking fluids that contain a lot of sugar or caffeine, such as energy drinks, sports drinks, and soda.  Avoid alcohol.  Avoid  spicy or fatty foods. General instructions  Take over-the-counter and prescription medicines only as told by your health care provider.  Rest at home while you recover.  Drink enough fluid to keep your urine pale yellow.  Breathe slowly and deeply when you feel nauseous.  Avoid smelling things that have strong odors.  Wash your hands often using soap and water. If soap and water are not available, use hand sanitizer.  Make sure that all people in your household wash their hands well and often.  Keep all follow-up visits as told by your health care provider. This is important. Contact a health care provider if:  Your nausea gets worse.  Your nausea does not go away after two days.  You vomit.  You cannot drink fluids without vomiting.  You have any of the following: ? New symptoms. ? A fever. ? A headache. ? Muscle cramps. ? A rash. ? Pain while urinating.  You feel light-headed or dizzy. Get help right away if:  You have pain in your chest, neck, arm, or jaw.  You feel extremely weak or you faint.  You have vomit that is bright red or looks like coffee grounds.  You have bloody or black stools or stools that look like tar.  You have a severe headache, a stiff neck, or both.  You have severe pain, cramping, or bloating in your abdomen.  You have difficulty breathing or are breathing very quickly.  Your heart is beating very quickly.  Your skin feels cold and clammy.  You feel confused.  You have signs of dehydration, such as: ? Dark urine, very little urine, or no  urine. ? Cracked lips. ? Dry mouth. ? Sunken eyes. ? Sleepiness. ? Weakness. These symptoms may represent a serious problem that is an emergency. Do not wait to see if the symptoms will go away. Get medical help right away. Call your local emergency services (911 in the [place] Do not drive yourself to the hospital. Summary  Nausea is the feeling that you have an upset stomach or that you are about to vomit. Nausea on its own is not usually a serious concern, but it may be an early sign of a more serious medical problem.  If vomiting develops, or if you are not able to drink enough fluids, you are at risk of becoming dehydrated.  Follow recommendations for eating and drinking and take over-the-counter and prescription medicines only as told by your health care provider.  Contact a health care provider right away if your symptoms worsen or you have new symptoms.  Keep all follow-up visits as told by your health care provider. This is important. This information is not intended to replace advice given to you by your health care provider. Make sure you discuss any questions you have with your health care provider. Document Revised: 11/06/2017 Document Reviewed: 11/06/2017 Elsevier Patient Education  [place].

## 2019-06-19 ENCOUNTER — Ambulatory Visit (INDEPENDENT_AMBULATORY_CARE_PROVIDER_SITE_OTHER): Payer: Medicaid Other | Admitting: Obstetrics & Gynecology

## 2019-06-19 ENCOUNTER — Other Ambulatory Visit: Payer: Self-pay

## 2019-06-19 VITALS — BP 148/93 | HR 88 | Wt 251.1 lb

## 2019-06-19 DIAGNOSIS — O09893 Supervision of other high risk pregnancies, third trimester: Secondary | ICD-10-CM

## 2019-06-19 DIAGNOSIS — O10913 Unspecified pre-existing hypertension complicating pregnancy, third trimester: Secondary | ICD-10-CM

## 2019-06-19 DIAGNOSIS — Z3A32 32 weeks gestation of pregnancy: Secondary | ICD-10-CM

## 2019-06-19 DIAGNOSIS — O09899 Supervision of other high risk pregnancies, unspecified trimester: Secondary | ICD-10-CM

## 2019-06-19 DIAGNOSIS — O10919 Unspecified pre-existing hypertension complicating pregnancy, unspecified trimester: Secondary | ICD-10-CM

## 2019-06-19 NOTE — Progress Notes (Signed)
   PRENATAL VISIT NOTE  Subjective:  Terri [name] is a 34 y.o. G2P1001 at [redacted]w[redacted]d being seen today for ongoing prenatal care.  She is currently monitored for the following issues for this high-risk pregnancy and has Obesity; TOBACCO USER; ASTHMA, INTERMITTENT; Unspecified episodic mood disorder; Nephrolithiasis; Mixed incontinence; Migraine; Supervision of other high risk pregnancy, antepartum; and Chronic hypertension affecting pregnancy on their problem list.  Patient reports carpal tunnel symptoms and pelvic pressure.  Contractions: Irritability. Vag. Bleeding: None.  Movement: Present. Denies leaking of fluid.   The following portions of the patient's history were reviewed and updated as appropriate: allergies, current medications, past family history, past medical history, past social history, past surgical history and problem list.   Objective:   Vitals:   06/19/19 1622  BP: (!) 148/93  Pulse: 88  Weight: 251 lb 1.6 oz (113.9 kg)    Fetal Status: Fetal Heart Rate (bpm): 150   Movement: Present     General:  Alert, oriented and cooperative. Patient is in no acute distress.  Skin: Skin is warm and dry. No rash noted.   Cardiovascular: Normal heart rate noted  Respiratory: Normal respiratory effort, no problems with respiration noted  Abdomen: Soft, gravid, appropriate for gestational age.  Pain/Pressure: Present     Pelvic: Cervical exam deferred        Extremities: Normal range of motion.  Edema: None  Mental Status: Normal mood and affect. Normal behavior. Normal judgment and thought content.   Assessment and Plan:  Pregnancy: G2P1001 at [redacted]w[redacted]d There are no diagnoses linked to this encounter. Preterm labor symptoms and general obstetric precautions including but not limited to vaginal bleeding, contractions, leaking of fluid and fetal movement were reviewed in detail with the patient. Please refer to After Visit Summary for other counseling recommendations.  CHTN- recent  change in Labetalol dose, continue for now and check at home, f/u next week in MFM Return in about 2 weeks (around 07/03/2019) for may be virtual.   Future Appointments  Date Time Provider [place]  06/25/2019 10:15 AM [place] [facility]  06/25/2019 10:15 AM [place] 4 WH-MFCUS M[facility]-US  07/02/2019  9:45 AM [place] [facility]  07/02/2019  9:45 AM [place] 5 WH-MFCUS M[facility]-US  07/03/2019 10:00 AM Sloan Leiter, MD [place] None  07/09/2019  9:45 AM WH-MFC NURSE WH-MFC MFC-US  07/09/2019  9:45 AM WH-MFC [place] 5 WH-MFCUS MFC-US    Emeterio Reeve, MD

## 2019-06-19 NOTE — Patient Instructions (Signed)

## 2019-06-19 NOTE — Progress Notes (Signed)
Pt is here for ROB, [redacted]w[redacted]d.

## 2019-06-25 ENCOUNTER — Encounter (HOSPITAL_COMMUNITY): Payer: Self-pay

## 2019-06-25 ENCOUNTER — Ambulatory Visit (HOSPITAL_COMMUNITY)
Admission: RE | Admit: 2019-06-25 | Discharge: 2019-06-25 | Disposition: A | Discharge status: Home / Self Care | Payer: Medicaid Other | Source: Ambulatory Visit | Attending: Obstetrics and Gynecology | Admitting: Obstetrics and Gynecology

## 2019-06-25 ENCOUNTER — Other Ambulatory Visit: Payer: Self-pay

## 2019-06-25 ENCOUNTER — Ambulatory Visit (HOSPITAL_COMMUNITY): Payer: Medicaid Other | Admitting: *Deleted

## 2019-06-25 VITALS — BP 137/96 | HR 88 | Temp 97.0°F

## 2019-06-25 DIAGNOSIS — O3413 Maternal care for benign tumor of corpus uteri, third trimester: Secondary | ICD-10-CM

## 2019-06-25 DIAGNOSIS — Z3A32 32 weeks gestation of pregnancy: Secondary | ICD-10-CM

## 2019-06-25 DIAGNOSIS — O10919 Unspecified pre-existing hypertension complicating pregnancy, unspecified trimester: Secondary | ICD-10-CM

## 2019-06-25 DIAGNOSIS — O10013 Pre-existing essential hypertension complicating pregnancy, third trimester: Secondary | ICD-10-CM | POA: Diagnosis not present

## 2019-06-25 DIAGNOSIS — O99333 Smoking (tobacco) complicating pregnancy, third trimester: Secondary | ICD-10-CM | POA: Diagnosis not present

## 2019-06-25 DIAGNOSIS — O99213 Obesity complicating pregnancy, third trimester: Secondary | ICD-10-CM

## 2019-06-25 DIAGNOSIS — Z362 Encounter for other antenatal screening follow-up: Secondary | ICD-10-CM | POA: Diagnosis not present

## 2019-06-25 DIAGNOSIS — D259 Leiomyoma of uterus, unspecified: Secondary | ICD-10-CM | POA: Diagnosis not present

## 2019-07-02 ENCOUNTER — Encounter (HOSPITAL_COMMUNITY): Payer: Self-pay

## 2019-07-02 ENCOUNTER — Ambulatory Visit (HOSPITAL_COMMUNITY): Payer: Medicaid Other | Admitting: *Deleted

## 2019-07-02 ENCOUNTER — Ambulatory Visit (HOSPITAL_COMMUNITY)
Admission: RE | Admit: 2019-07-02 | Discharge: 2019-07-02 | Disposition: A | Discharge status: Home / Self Care | Payer: Medicaid Other | Source: Ambulatory Visit | Attending: Obstetrics and Gynecology | Admitting: Obstetrics and Gynecology

## 2019-07-02 ENCOUNTER — Other Ambulatory Visit: Payer: Self-pay

## 2019-07-02 VITALS — BP 134/92 | HR 92 | Temp 97.1°F

## 2019-07-02 DIAGNOSIS — O10919 Unspecified pre-existing hypertension complicating pregnancy, unspecified trimester: Secondary | ICD-10-CM | POA: Insufficient documentation

## 2019-07-02 DIAGNOSIS — D259 Leiomyoma of uterus, unspecified: Secondary | ICD-10-CM | POA: Diagnosis not present

## 2019-07-02 DIAGNOSIS — O3413 Maternal care for benign tumor of corpus uteri, third trimester: Secondary | ICD-10-CM

## 2019-07-02 DIAGNOSIS — O99333 Smoking (tobacco) complicating pregnancy, third trimester: Secondary | ICD-10-CM | POA: Diagnosis not present

## 2019-07-02 DIAGNOSIS — Z3A33 33 weeks gestation of pregnancy: Secondary | ICD-10-CM | POA: Diagnosis not present

## 2019-07-02 DIAGNOSIS — O10913 Unspecified pre-existing hypertension complicating pregnancy, third trimester: Secondary | ICD-10-CM

## 2019-07-02 DIAGNOSIS — O99213 Obesity complicating pregnancy, third trimester: Secondary | ICD-10-CM

## 2019-07-03 ENCOUNTER — Encounter: Payer: Self-pay | Admitting: Obstetrics and Gynecology

## 2019-07-03 ENCOUNTER — Telehealth (INDEPENDENT_AMBULATORY_CARE_PROVIDER_SITE_OTHER): Payer: Medicaid Other | Admitting: Obstetrics and Gynecology

## 2019-07-03 DIAGNOSIS — O10913 Unspecified pre-existing hypertension complicating pregnancy, third trimester: Secondary | ICD-10-CM

## 2019-07-03 DIAGNOSIS — O10919 Unspecified pre-existing hypertension complicating pregnancy, unspecified trimester: Secondary | ICD-10-CM

## 2019-07-03 DIAGNOSIS — G43709 Chronic migraine without aura, not intractable, without status migrainosus: Secondary | ICD-10-CM

## 2019-07-03 DIAGNOSIS — O09893 Supervision of other high risk pregnancies, third trimester: Secondary | ICD-10-CM

## 2019-07-03 DIAGNOSIS — O09899 Supervision of other high risk pregnancies, unspecified trimester: Secondary | ICD-10-CM

## 2019-07-03 DIAGNOSIS — Z3A34 34 weeks gestation of pregnancy: Secondary | ICD-10-CM

## 2019-07-03 NOTE — Progress Notes (Signed)
ROB   CC: NONE

## 2019-07-03 NOTE — Progress Notes (Signed)
   TELEHEALTH OBSTETRICS PRENATAL VIRTUAL VIDEO VISIT ENCOUNTER NOTE  Provider location: [facility] [place]   I connected with Rosette Reveal on 07/03/19 at 10:00 AM EST by MyChart Video Encounter at home and verified that I am speaking with the correct person using two identifiers.   I discussed the limitations, risks, security and privacy concerns of performing an evaluation and management service virtually and the availability of in person appointments. I also discussed with the patient that there may be a patient responsible charge related to this service. The patient expressed understanding and agreed to proceed. Subjective:  Terri [name] is a 34 y.o. G2P1001 at [redacted]w[redacted]d being seen today for ongoing prenatal care.  She is currently monitored for the following issues for this high-risk pregnancy and has Obesity; TOBACCO USER; ASTHMA, INTERMITTENT; Unspecified episodic mood disorder; Nephrolithiasis; Mixed incontinence; Migraine; Supervision of other high risk pregnancy, antepartum; and Chronic hypertension affecting pregnancy on their problem list.  Patient reports no complaints.  Contractions: Not present. Vag. Bleeding: None.  Movement: Present. Denies any leaking of fluid.   The following portions of the patient's history were reviewed and updated as appropriate: allergies, current medications, past family history, past medical history, past social history, past surgical history and problem list.   Objective:  There were no vitals filed for this visit.  Fetal Status:     Movement: Present     General:  Alert, oriented and cooperative. Patient is in no acute distress.  Respiratory: Normal respiratory effort, no problems with respiration noted  Mental Status: Normal mood and affect. Normal behavior. Normal judgment and thought content.  Rest of physical exam deferred due to type of encounter  Imaging:  Assessment and Plan:  Pregnancy: G2P1001 at [redacted]w[redacted]d  1.  Chronic hypertension affecting pregnancy Labetalol 300 mg BID Pt reports BP 140-150s/90s  2. Chronic migraine without aura without status migrainosus, not intractable stable  3. Supervision of other high risk pregnancy, antepartum Wants to keep placenta and have delayed cord clamping about 30 min, message sent to Amy Skrinjar to ask about this  Preterm labor symptoms and general obstetric precautions including but not limited to vaginal bleeding, contractions, leaking of fluid and fetal movement were reviewed in detail with the patient. I discussed the assessment and treatment plan with the patient. The patient was provided an opportunity to ask questions and all were answered. The patient agreed with the plan and demonstrated an understanding of the instructions. The patient was advised to call back or seek an in-person office evaluation/go to [facility] at [facility] for any urgent or concerning symptoms. Please refer to After Visit Summary for other counseling recommendations.   I provided 20 minutes of face-to-face time during this encounter.  Return in about 2 weeks (around 07/17/2019) for high OB, in person, 36 week swabs.  Future Appointments  Date Time Provider [place]  07/09/2019  9:45 AM [place] NURSE [place] [facility]  07/09/2019  9:45 AM [place] 5 WH-MFCUS MFC-US    Sloan Leiter, [place] [facility], [place]

## 2019-07-09 ENCOUNTER — Other Ambulatory Visit (HOSPITAL_COMMUNITY): Payer: Self-pay | Admitting: *Deleted

## 2019-07-09 ENCOUNTER — Other Ambulatory Visit: Payer: Self-pay

## 2019-07-09 ENCOUNTER — Ambulatory Visit (HOSPITAL_COMMUNITY)
Admission: RE | Admit: 2019-07-09 | Discharge: 2019-07-09 | Disposition: A | Discharge status: Home / Self Care | Payer: Medicaid Other | Source: Ambulatory Visit | Attending: Obstetrics and Gynecology | Admitting: Obstetrics and Gynecology

## 2019-07-09 ENCOUNTER — Encounter (HOSPITAL_COMMUNITY): Payer: Self-pay

## 2019-07-09 ENCOUNTER — Ambulatory Visit (HOSPITAL_COMMUNITY): Payer: Medicaid Other | Admitting: *Deleted

## 2019-07-09 VITALS — BP 135/86 | HR 94 | Temp 97.1°F

## 2019-07-09 DIAGNOSIS — O10919 Unspecified pre-existing hypertension complicating pregnancy, unspecified trimester: Secondary | ICD-10-CM | POA: Diagnosis not present

## 2019-07-09 DIAGNOSIS — O99333 Smoking (tobacco) complicating pregnancy, third trimester: Secondary | ICD-10-CM

## 2019-07-09 DIAGNOSIS — Z3A34 34 weeks gestation of pregnancy: Secondary | ICD-10-CM | POA: Diagnosis not present

## 2019-07-09 DIAGNOSIS — O99213 Obesity complicating pregnancy, third trimester: Secondary | ICD-10-CM | POA: Diagnosis not present

## 2019-07-09 DIAGNOSIS — O3413 Maternal care for benign tumor of corpus uteri, third trimester: Secondary | ICD-10-CM | POA: Diagnosis not present

## 2019-07-09 DIAGNOSIS — D259 Leiomyoma of uterus, unspecified: Secondary | ICD-10-CM | POA: Diagnosis not present

## 2019-07-09 DIAGNOSIS — O10013 Pre-existing essential hypertension complicating pregnancy, third trimester: Secondary | ICD-10-CM

## 2019-07-16 ENCOUNTER — Other Ambulatory Visit: Payer: Self-pay

## 2019-07-16 ENCOUNTER — Ambulatory Visit (HOSPITAL_BASED_OUTPATIENT_CLINIC_OR_DEPARTMENT_OTHER): Payer: Medicaid Other | Admitting: *Deleted

## 2019-07-16 ENCOUNTER — Encounter (HOSPITAL_COMMUNITY): Payer: Self-pay

## 2019-07-16 ENCOUNTER — Ambulatory Visit (HOSPITAL_COMMUNITY): Payer: Medicaid Other

## 2019-07-16 ENCOUNTER — Ambulatory Visit (HOSPITAL_COMMUNITY): Payer: Medicaid Other | Attending: Obstetrics and Gynecology | Admitting: *Deleted

## 2019-07-16 VITALS — BP 141/87 | HR 95 | Temp 97.2°F

## 2019-07-16 DIAGNOSIS — O99213 Obesity complicating pregnancy, third trimester: Secondary | ICD-10-CM

## 2019-07-16 DIAGNOSIS — Z3A35 35 weeks gestation of pregnancy: Secondary | ICD-10-CM | POA: Diagnosis not present

## 2019-07-16 DIAGNOSIS — O10913 Unspecified pre-existing hypertension complicating pregnancy, third trimester: Secondary | ICD-10-CM

## 2019-07-16 DIAGNOSIS — O10919 Unspecified pre-existing hypertension complicating pregnancy, unspecified trimester: Secondary | ICD-10-CM

## 2019-07-16 DIAGNOSIS — Z3A36 36 weeks gestation of pregnancy: Secondary | ICD-10-CM
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
## 2019-07-16 NOTE — Procedures (Signed)
Terri [name] January 22, 1986 [redacted]w[redacted]d  Fetus A Non-Stress Test Interpretation for 07/16/19  Indication: Chronic Hypertenstion  Fetal Heart Rate A Mode: External Baseline Rate (A): 140 bpm Variability: Moderate Accelerations: 15 x 15 Decelerations: None Multiple birth?: No  Uterine Activity Mode: Toco Contraction Frequency (min): irreg UC noted Contraction Duration (sec): 60-90 Contraction Quality: Mild Resting Tone Palpated: Relaxed Resting Time: Adequate  Interpretation (Fetal Testing) Nonstress Test Interpretation: Reactive Comments: FHR tracing rev'd by Dr. Annamaria Boots

## 2019-07-17 ENCOUNTER — Other Ambulatory Visit (HOSPITAL_COMMUNITY)
Admission: RE | Admit: 2019-07-17 | Discharge: 2019-07-17 | Disposition: A | Discharge status: Home / Self Care | Payer: Medicaid Other | Source: Ambulatory Visit | Attending: Obstetrics & Gynecology | Admitting: Obstetrics & Gynecology

## 2019-07-17 ENCOUNTER — Ambulatory Visit (INDEPENDENT_AMBULATORY_CARE_PROVIDER_SITE_OTHER): Payer: Medicaid Other | Admitting: Obstetrics & Gynecology

## 2019-07-17 ENCOUNTER — Other Ambulatory Visit: Payer: Self-pay

## 2019-07-17 VITALS — BP 136/108 | HR 108 | Wt 256.0 lb

## 2019-07-17 DIAGNOSIS — Z3A36 36 weeks gestation of pregnancy: Secondary | ICD-10-CM

## 2019-07-17 DIAGNOSIS — O10913 Unspecified pre-existing hypertension complicating pregnancy, third trimester: Secondary | ICD-10-CM

## 2019-07-17 DIAGNOSIS — O09899 Supervision of other high risk pregnancies, unspecified trimester: Secondary | ICD-10-CM

## 2019-07-17 DIAGNOSIS — O10919 Unspecified pre-existing hypertension complicating pregnancy, unspecified trimester: Secondary | ICD-10-CM

## 2019-07-17 DIAGNOSIS — O09893 Supervision of other high risk pregnancies, third trimester: Secondary | ICD-10-CM

## 2019-07-17 MED ORDER — LABETALOL HCL 300 MG PO TABS: 600.0000 mg | ORAL_TABLET | Freq: Three times a day (TID) | ORAL | 5 refills | Status: DC

## 2019-07-17 NOTE — Progress Notes (Signed)
   PRENATAL VISIT NOTE  Subjective:  Terri [name] is a 34 y.o. G2P1001 at [redacted]w[redacted]d being seen today for ongoing prenatal care.  She is currently monitored for the following issues for this high-risk pregnancy and has Obesity; TOBACCO USER; ASTHMA, INTERMITTENT; Unspecified episodic mood disorder; Nephrolithiasis; Mixed incontinence; Migraine; Supervision of other high risk pregnancy, antepartum; and Chronic hypertension affecting pregnancy on their problem list.  Patient reports occasional contractions.  Contractions: Irregular. Vag. Bleeding: None.  Movement: Present. Denies leaking of fluid.   The following portions of the patient's history were reviewed and updated as appropriate: allergies, current medications, past family history, past medical history, past social history, past surgical history and problem list.   Objective:   Vitals:   07/17/19 1049  BP: (!) 136/108  Pulse: (!) 108  Weight: 256 lb (116.1 kg)    Fetal Status:     Movement: Present  Presentation: Vertex  General:  Alert, oriented and cooperative. Patient is in no acute distress.  Skin: Skin is warm and dry. No rash noted.   Cardiovascular: Normal heart rate noted  Respiratory: Normal respiratory effort, no problems with respiration noted  Abdomen: Soft, gravid, appropriate for gestational age.  Pain/Pressure: Present     Pelvic: Cervical exam performed Dilation: 1 Effacement (%): 20 Station: Ballotable  Extremities: Normal range of motion.  Edema: None  Mental Status: Normal mood and affect. Normal behavior. Normal judgment and thought content.   Assessment and Plan:  Pregnancy: G2P1001 at [redacted]w[redacted]d 1. Chronic hypertension affecting pregnancy She did not take her medication today, urged compliance,report home BP, increased dose - labetalol (NORMODYNE) 300 MG tablet; Take 2 tablets (600 mg total) by mouth 3 (three) times daily.  Dispense: 60 tablet; Refill: 5  2. Supervision of other high risk pregnancy,  antepartum routine - Culture, beta strep (group b only) - Cervicovaginal ancillary only  Preterm labor symptoms and general obstetric precautions including but not limited to vaginal bleeding, contractions, leaking of fluid and fetal movement were reviewed in detail with the patient. Please refer to After Visit Summary for other counseling recommendations.   Return in about 1 week (around 07/24/2019).  Future Appointments  Date Time Provider [place]  07/23/2019  9:45 AM [place] NURSE [place] [facility]  07/23/2019  9:45 AM [place] 2 [facility]    Emeterio Reeve, MD

## 2019-07-17 NOTE — Patient Instructions (Signed)

## 2019-07-18 LAB — CERVICOVAGINAL ANCILLARY ONLY
Bacterial Vaginitis (gardnerella): POSITIVE — AB
Candida Glabrata: NEGATIVE
Candida Vaginitis: NEGATIVE
Chlamydia: NEGATIVE
Comment: NEGATIVE
Comment: NEGATIVE
Comment: NEGATIVE
Comment: NEGATIVE
Comment: NEGATIVE
Comment: NORMAL
Neisseria Gonorrhea: NEGATIVE
Trichomonas: NEGATIVE

## 2019-07-21 ENCOUNTER — Other Ambulatory Visit: Payer: Self-pay

## 2019-07-21 ENCOUNTER — Encounter (HOSPITAL_COMMUNITY): Payer: Self-pay | Admitting: Obstetrics & Gynecology

## 2019-07-21 ENCOUNTER — Inpatient Hospital Stay (HOSPITAL_COMMUNITY)
Admission: AD | Admit: 2019-07-21 | Discharge: 2019-07-26 | DRG: 807 | Disposition: A | Discharge status: Home / Self Care | Payer: Medicaid Other | Attending: Obstetrics and Gynecology | Admitting: Obstetrics and Gynecology

## 2019-07-21 ENCOUNTER — Other Ambulatory Visit: Payer: Self-pay | Admitting: Obstetrics & Gynecology

## 2019-07-21 DIAGNOSIS — O99214 Obesity complicating childbirth: Secondary | ICD-10-CM | POA: Diagnosis present

## 2019-07-21 DIAGNOSIS — D649 Anemia, unspecified: Secondary | ICD-10-CM | POA: Diagnosis present

## 2019-07-21 DIAGNOSIS — Z20822 Contact with and (suspected) exposure to covid-19: Secondary | ICD-10-CM | POA: Diagnosis present

## 2019-07-21 DIAGNOSIS — O1002 Pre-existing essential hypertension complicating childbirth: Secondary | ICD-10-CM | POA: Diagnosis not present

## 2019-07-21 DIAGNOSIS — Z3A37 37 weeks gestation of pregnancy: Secondary | ICD-10-CM

## 2019-07-21 DIAGNOSIS — O114 Pre-existing hypertension with pre-eclampsia, complicating childbirth: Principal | ICD-10-CM | POA: Diagnosis present

## 2019-07-21 DIAGNOSIS — Z87891 Personal history of nicotine dependence: Secondary | ICD-10-CM | POA: Diagnosis not present

## 2019-07-21 DIAGNOSIS — E669 Obesity, unspecified: Secondary | ICD-10-CM | POA: Diagnosis present

## 2019-07-21 DIAGNOSIS — O99210 Obesity complicating pregnancy, unspecified trimester: Secondary | ICD-10-CM | POA: Diagnosis present

## 2019-07-21 DIAGNOSIS — O119 Pre-existing hypertension with pre-eclampsia, unspecified trimester: Secondary | ICD-10-CM | POA: Diagnosis present

## 2019-07-21 DIAGNOSIS — O9902 Anemia complicating childbirth: Secondary | ICD-10-CM | POA: Diagnosis present

## 2019-07-21 DIAGNOSIS — O09899 Supervision of other high risk pregnancies, unspecified trimester: Secondary | ICD-10-CM

## 2019-07-21 DIAGNOSIS — O26893 Other specified pregnancy related conditions, third trimester: Secondary | ICD-10-CM | POA: Diagnosis present

## 2019-07-21 LAB — COMPREHENSIVE METABOLIC PANEL
ALT: 13 U/L (ref 0–44)
AST: 16 U/L (ref 15–41)
Albumin: 3 g/dL — ABNORMAL LOW (ref 3.5–5.0)
Alkaline Phosphatase: 91 U/L (ref 38–126)
Anion gap: 10 (ref 5–15)
BUN: 6 mg/dL (ref 6–20)
CO2: 19 mmol/L — ABNORMAL LOW (ref 22–32)
Calcium: 8.9 mg/dL (ref 8.9–10.3)
Chloride: 104 mmol/L (ref 98–111)
Creatinine, Ser: 0.79 mg/dL (ref 0.44–1.00)
GFR calc Af Amer: >60 mL/min (ref >60)
GFR calc non Af Amer: >60 mL/min (ref >60)
Glucose, Bld: 73 mg/dL (ref 70–99)
Potassium: 3.6 mmol/L (ref 3.5–5.1)
Sodium: 133 mmol/L — ABNORMAL LOW (ref 135–145)
Total Bilirubin: 0.3 mg/dL (ref 0.3–1.2)
Total Protein: 6.7 g/dL (ref 6.5–8.1)

## 2019-07-21 LAB — CBC
HCT: 34.2 % — ABNORMAL LOW (ref 36.0–46.0)
Hemoglobin: 11.1 g/dL — ABNORMAL LOW (ref 12.0–15.0)
MCH: 30.1 pg (ref 26.0–34.0)
MCHC: 32.5 g/dL (ref 30.0–36.0)
MCV: 92.7 fL (ref 80.0–100.0)
Platelets: 203 10*3/uL (ref 150–400)
RBC: 3.69 MIL/uL — ABNORMAL LOW (ref 3.87–5.11)
RDW: 13 % (ref 11.5–15.5)
WBC: 6.4 10*3/uL (ref 4.0–10.5)
nRBC: 0 % (ref 0.0–0.2)

## 2019-07-21 LAB — CULTURE, BETA STREP (GROUP B ONLY): Strep Gp B Culture: NEGATIVE

## 2019-07-21 MED ORDER — LABETALOL HCL 5 MG/ML IV SOLN
INTRAVENOUS | Status: AC
  Administered 2019-07-21: 20 mg via INTRAVENOUS
  Filled 2019-07-21: qty 4

## 2019-07-21 MED ORDER — MAGNESIUM SULFATE 40 GM/1000ML IV SOLN
2.0000 g/h | INTRAVENOUS | Status: DC
  Administered 2019-07-22: 2 g/h via INTRAVENOUS
  Filled 2019-07-21: qty 1000

## 2019-07-21 MED ORDER — LABETALOL HCL 5 MG/ML IV SOLN
20.0000 mg | INTRAVENOUS | Status: DC | PRN
  Administered 2019-07-22: 20 mg via INTRAVENOUS
  Filled 2019-07-21: qty 4

## 2019-07-21 MED ORDER — HYDRALAZINE HCL 20 MG/ML IJ SOLN: 10.0000 mg | INTRAMUSCULAR | Status: DC | PRN

## 2019-07-21 MED ORDER — LIDOCAINE HCL (PF) 1 % IJ SOLN
30.0000 mL | INTRAMUSCULAR | Status: AC | PRN
  Administered 2019-07-23: 30 mL via SUBCUTANEOUS
  Filled 2019-07-21: qty 30

## 2019-07-21 MED ORDER — OXYTOCIN BOLUS FROM INFUSION
500.0000 mL | Freq: Once | INTRAVENOUS | Status: AC
  Administered 2019-07-23: 500 mL via INTRAVENOUS

## 2019-07-21 MED ORDER — LABETALOL HCL 200 MG PO TABS
600.0000 mg | ORAL_TABLET | Freq: Three times a day (TID) | ORAL | Status: DC
  Administered 2019-07-21 – 2019-07-23 (×4): 600 mg via ORAL
  Filled 2019-07-21 (×3): qty 3
  Filled 2019-07-21: qty 6

## 2019-07-21 MED ORDER — ONDANSETRON HCL 4 MG/2ML IJ SOLN: 4.0000 mg | Freq: Four times a day (QID) | INTRAMUSCULAR | Status: DC | PRN

## 2019-07-21 MED ORDER — LABETALOL HCL 5 MG/ML IV SOLN: 80.0000 mg | INTRAVENOUS | Status: DC | PRN

## 2019-07-21 MED ORDER — FENTANYL CITRATE (PF) 100 MCG/2ML IJ SOLN
100.0000 ug | Freq: Once | INTRAMUSCULAR | Status: AC
  Administered 2019-07-21: 100 ug via INTRAVENOUS

## 2019-07-21 MED ORDER — METRONIDAZOLE 500 MG PO TABS: 500.0000 mg | ORAL_TABLET | Freq: Two times a day (BID) | ORAL | 0 refills | Status: DC

## 2019-07-21 MED ORDER — LACTATED RINGERS IV SOLN: 500.0000 mL | INTRAVENOUS | Status: DC | PRN

## 2019-07-21 MED ORDER — MAGNESIUM SULFATE BOLUS VIA INFUSION
4.0000 g | Freq: Once | INTRAVENOUS | Status: AC
  Filled 2019-07-21: qty 1000

## 2019-07-21 MED ORDER — OXYTOCIN 40 UNITS IN NORMAL SALINE INFUSION - SIMPLE MED
2.5000 [IU]/h | INTRAVENOUS | Status: DC
  Filled 2019-07-21: qty 1000

## 2019-07-21 MED ORDER — TERBUTALINE SULFATE 1 MG/ML IJ SOLN: 0.2500 mg | Freq: Once | INTRAMUSCULAR | Status: DC | PRN

## 2019-07-21 MED ORDER — SOD CITRATE-CITRIC ACID 500-334 MG/5ML PO SOLN
30.0000 mL | ORAL | Status: DC | PRN
  Administered 2019-07-22 (×2): 30 mL via ORAL
  Filled 2019-07-21 (×2): qty 30

## 2019-07-21 MED ORDER — MAGNESIUM SULFATE 40 GM/1000ML IV SOLN
INTRAVENOUS | Status: AC
  Administered 2019-07-21: 4 g via INTRAVENOUS
  Filled 2019-07-21: qty 1000

## 2019-07-21 MED ORDER — LABETALOL HCL 5 MG/ML IV SOLN
40.0000 mg | INTRAVENOUS | Status: DC | PRN
  Administered 2019-07-22: 40 mg via INTRAVENOUS
  Filled 2019-07-21: qty 8

## 2019-07-21 MED ORDER — FENTANYL CITRATE (PF) 100 MCG/2ML IJ SOLN
50.0000 ug | INTRAMUSCULAR | Status: DC | PRN
  Administered 2019-07-22 (×4): 100 ug via INTRAVENOUS
  Administered 2019-07-22: 50 ug via INTRAVENOUS
  Administered 2019-07-22 – 2019-07-23 (×8): 100 ug via INTRAVENOUS
  Filled 2019-07-21 (×14): qty 2

## 2019-07-21 MED ORDER — LACTATED RINGERS IV SOLN: INTRAVENOUS | Status: DC

## 2019-07-21 MED ORDER — MISOPROSTOL 50MCG HALF TABLET
50.0000 ug | ORAL_TABLET | ORAL | Status: DC | PRN
  Administered 2019-07-21 – 2019-07-22 (×3): 50 ug via ORAL
  Filled 2019-07-21 (×3): qty 1

## 2019-07-21 MED ORDER — ACETAMINOPHEN 325 MG PO TABS
650.0000 mg | ORAL_TABLET | ORAL | Status: DC | PRN
  Administered 2019-07-22 (×2): 650 mg via ORAL
  Filled 2019-07-21 (×2): qty 2

## 2019-07-21 NOTE — MAU Note (Signed)
.   Terri [name] is a 34 y.o. at [redacted]w[redacted]d here in MAU reporting: Patient reports a BP of 188/106 and 174/110 at home. She reports HA but no blurred vision. She reports spotting and clear discharge. Patient reports good fetal movement. Patient also reports occ ctx.   Pain score: 8 Vitals:   07/21/19 2109  BP: (!) 166/99  Pulse: 94  Resp: 17  Temp: 98.4 F (36.9 C)  SpO2: 100%     FHT:146 Lab orders placed from triage:

## 2019-07-21 NOTE — H&P (Signed)
LABOR AND DELIVERY ADMISSION HISTORY AND PHYSICAL NOTE  Terri [name] is a 34 y.o. female G2P1001 with IUP at 38w0dby 7wk UKoreapresenting for pelvic pressure.   She reports positive fetal movement. She denies leakage of fluid, vaginal bleeding, or contractions.   Does endorse some pelvic pressure.   Denies headache, vision changes, chest pain, SOB, RUQ pain, LE edema.  She reports good compliance with labetalol '600mg'$  TID.   She plans on breast and bottle feeding. Her contraception plan is: unsure.  [facility] , CWD, normal anatomy, cephalic presentation, posterior placenta, 61%ile, EFW 2147 grams  Pregnancy complications:  - cHTN on labetalol '600mg'$  TID  Past Medical History: Past Medical History:  Diagnosis Date  . Asthma   . Hypertension   . Hypertention, malignant, with acute intensive management     Past Surgical History: History reviewed. No pertinent surgical history.  Obstetrical History: OB History    Gravida  2   Para  1   Term  1   Preterm      AB      Living  1     SAB      TAB      Ectopic      Multiple      Live Births  1           Social History: Social History   Socioeconomic History  . Marital status: Single    Spouse name: Not on file  . Number of children: Not on file  . Years of education: Not on file  . Highest education level: Not on file  Occupational History  . Occupation: Unemployed  Tobacco Use  . Smoking status: Former Smoker    Packs/day: 0.20    Types: Cigarettes    Quit date: 11/24/2018    Years since quitting: 0.6  . Smokeless tobacco: Former [employer] developer   Types: Snuff  Substance and Sexual Activity  . Alcohol use: Not Currently    Comment: 11/2018  . Drug use: Not Currently    Types: Marijuana    Comment: 12/11/2018; previously used  . Sexual activity: Yes    Birth control/protection: None  Other Topics Concern  . Not on file  Social History Narrative   **  Merged History Encounter **       Social Determinants of Health   Financial Resource Strain:   . Difficulty of Paying Living Expenses: Not on file  Food Insecurity:   . Worried About RCharity fundraiserin the Last Year: Not on file  . Ran Out of Food in the Last Year: Not on file  Transportation Needs:   . Lack of Transportation (Medical): Not on file  . Lack of Transportation (Non-Medical): Not on file  Physical Activity:   . Days of Exercise per Week: Not on file  . Minutes of Exercise per Session: Not on file  Stress:   . Feeling of Stress : Not on file  Social Connections:   . Frequency of Communication with Friends and Family: Not on file  . Frequency of Social Gatherings with Friends and Family: Not on file  . Attends Religious Services: Not on file  . Active Member of Clubs or Organizations: Not on file  . Attends CArchivistMeetings: Not on file  . Marital Status: Not on file    Family History: Family History  Problem Relation Age of Onset  . Hypertension Mother   . Cancer  Maternal Grandfather   . Cancer Paternal Grandfather     Allergies: Allergies  Allergen Reactions  . Vicodin [Hydrocodone-Acetaminophen] Itching    Medications Prior to Admission  Medication Sig Dispense Refill Last Dose  . labetalol (NORMODYNE) 300 MG tablet Take 2 tablets (600 mg total) by mouth 3 (three) times daily. 60 tablet 5 07/21/2019 at 1500  . Blood Pressure Monitoring KIT 1 kit by Does not apply route once a week. (Patient not taking: Reported on 05/28/2019) 1 kit 0   . docusate sodium (COLACE) 100 MG capsule Take 1 capsule (100 mg total) by mouth 2 (two) times daily as needed. (Patient not taking: Reported on 03/19/2019) 60 capsule 5   . Elastic Bandages & Supports (COMFORT FIT MATERNITY SUPP SM) MISC Wear as directed. (Patient not taking: Reported on 05/14/2019) 1 each 0   . famotidine (PEPCID) 20 MG tablet Take 1 tablet (20 mg total) by mouth 2 (two) times daily. 60 tablet  3   . metoCLOPramide (REGLAN) 10 MG tablet Take 1 tablet (10 mg total) by mouth every 6 (six) hours as needed for nausea (Or headache). (Patient not taking: Reported on 02/20/2019) 30 tablet 1   . metroNIDAZOLE (FLAGYL) 500 MG tablet Take 1 tablet (500 mg total) by mouth 2 (two) times daily for 7 days. 14 tablet 0   . ondansetron (ZOFRAN-ODT) 4 MG disintegrating tablet Take 1 tablet (4 mg total) by mouth every 6 (six) hours as needed for nausea or vomiting. (Patient not taking: Reported on 06/19/2019) 20 tablet 0   . polyethylene glycol (MIRALAX) 17 g packet Take 17 g by mouth at bedtime as needed for mild constipation or moderate constipation. (Patient not taking: Reported on 05/28/2019) 30 each 5   . Prenatal MV-Min-FA-Omega-3 (PRENATAL GUMMIES/DHA & FA) 0.4-32.5 MG CHEW Chew by mouth.     . promethazine (PHENERGAN) 25 MG tablet Take 1 tablet (25 mg total) by mouth every 6 (six) hours as needed for nausea or vomiting. (Patient not taking: Reported on 01/07/2019) 30 tablet 0      Review of Systems  All systems reviewed and negative except as stated in HPI  Physical Exam Blood pressure (!) 150/102, pulse 92, temperature 98.4 F (36.9 C), temperature source Oral, resp. rate 17, height '5\' 8"'$  (1.727 m), weight 115.4 kg, last menstrual period 11/04/2018, SpO2 100 %. General appearance: alert, oriented, NAD Lungs: normal respiratory effort Heart: regular rate Abdomen: soft, non-tender; gravid, leopolds 2900 g Extremities: No calf swelling or tenderness Presentation: cephalic by RN SVE  Fetal monitoringBaseline: 145 bpm, Variability: Good {> 6 bpm), Accelerations: Reactive and Decelerations: Absent Uterine activityNone  Dilation: 2 Effacement (%): 20, 30 Station: Ballotable Exam by:: Denyse Dago, RN  Prenatal labs: ABO, Rh: O/Positive/-- (08/13 1132) Antibody: Negative (08/13 1132) Rubella: 1.06 (08/13 1132) RPR: Non Reactive (12/03 1041)  HBsAg: Negative (08/13 1132)  HIV: Non Reactive  (12/03 1041)  GC/Chlamydia: neg/neg 07/17/2019  GBS: Negative/-- (02/04 0114)  2-hr GTT: normal 05/15/2019 Genetic screening:  Low risk panorama Anatomy US: normal  Prenatal Transfer Tool  Maternal Diabetes: No Genetic Screening: Normal Maternal Ultrasounds/Referrals: Normal Fetal Ultrasounds or other Referrals:  None Maternal Substance Abuse:  No Significant Maternal Medications:  Meds include: Other: Labetalol '600mg'$  TID Significant Maternal Lab Results: Group B Strep negative  No results found for this or any previous visit (from the past 24 hour(s)).  Patient Active Problem List   Diagnosis Date Noted  . Chronic hypertension affecting pregnancy 01/31/2019  . Supervision of  other high risk pregnancy, antepartum 01/07/2019  . Migraine 11/12/2014  . Mixed incontinence 10/03/2013  . Nephrolithiasis 06/18/2013  . Unspecified episodic mood disorder 04/05/2011  . TOBACCO USER 06/14/2009  . Obesity 01/05/2009  . ASTHMA, INTERMITTENT 08/09/2006    Assessment: Terri [name] is a 34 y.o. G2P1001 at 46w0dhere for IOL for poorly controlled cHTN at term.   #Labor: Unable to place FB, miso given at 2343, reassess in four hours #Pain: IV pain meds PRN, epidural upon request #FWB: Cat I #GBS/ID: negative #COVID: swab pending #MOF: Breast and bottle #MOC: Unsure #Circ: Yes, outpatient  #cHTN w SI PreE w SF (BP): Poorly controlled throughout pregnancy, now w persistent severe range BP's. Baseline labs on admission, cont labetalol 6062mTID, start Mg drip and labetalol protocol.    MaAnnice NeedycLa[facility] Treatment Center/01/2020, 9:53 PM

## 2019-07-22 ENCOUNTER — Encounter: Payer: Self-pay | Admitting: Obstetrics & Gynecology

## 2019-07-22 LAB — CBC
HCT: 32.5 % — ABNORMAL LOW (ref 36.0–46.0)
Hemoglobin: 10.7 g/dL — ABNORMAL LOW (ref 12.0–15.0)
MCH: 30.4 pg (ref 26.0–34.0)
MCHC: 32.9 g/dL (ref 30.0–36.0)
MCV: 92.3 fL (ref 80.0–100.0)
Platelets: 201 10*3/uL (ref 150–400)
RBC: 3.52 MIL/uL — ABNORMAL LOW (ref 3.87–5.11)
RDW: 13.2 % (ref 11.5–15.5)
WBC: 5.2 10*3/uL (ref 4.0–10.5)
nRBC: 0 % (ref 0.0–0.2)

## 2019-07-22 LAB — RPR: RPR Ser Ql: NONREACTIVE

## 2019-07-22 LAB — PROTEIN / CREATININE RATIO, URINE
Creatinine, Urine: 472.94 mg/dL
Protein Creatinine Ratio: 0.14 mg/mg{Cre} (ref 0.00–0.15)
Total Protein, Urine: 65 mg/dL

## 2019-07-22 LAB — SARS CORONAVIRUS 2 (TAT 6-24 HRS): SARS Coronavirus 2: NEGATIVE

## 2019-07-22 LAB — TYPE AND SCREEN
ABO/RH(D): O POS
Antibody Screen: NEGATIVE

## 2019-07-22 LAB — ABO/RH: ABO/RH(D): O POS

## 2019-07-22 MED ORDER — PHENYLEPHRINE 40 MCG/ML (10ML) SYRINGE FOR IV PUSH (FOR BLOOD PRESSURE SUPPORT): 80.0000 ug | PREFILLED_SYRINGE | INTRAVENOUS | Status: DC | PRN

## 2019-07-22 MED ORDER — BUTALBITAL-APAP-CAFFEINE 50-325-40 MG PO TABS
2.0000 | ORAL_TABLET | ORAL | Status: DC | PRN
  Administered 2019-07-22: 2 via ORAL
  Filled 2019-07-22 (×2): qty 2

## 2019-07-22 MED ORDER — BUTALBITAL-APAP-CAFFEINE 50-325-40 MG PO TABS
2.0000 | ORAL_TABLET | Freq: Once | ORAL | Status: AC
  Administered 2019-07-22: 2 via ORAL
  Filled 2019-07-22: qty 2

## 2019-07-22 MED ORDER — LACTATED RINGERS IV SOLN: 500.0000 mL | Freq: Once | INTRAVENOUS | Status: DC

## 2019-07-22 MED ORDER — EPHEDRINE 5 MG/ML INJ: 10.0000 mg | INTRAVENOUS | Status: DC | PRN

## 2019-07-22 MED ORDER — DIPHENHYDRAMINE HCL 50 MG/ML IJ SOLN: 12.5000 mg | INTRAMUSCULAR | Status: DC | PRN

## 2019-07-22 MED ORDER — TERBUTALINE SULFATE 1 MG/ML IJ SOLN: 0.2500 mg | Freq: Once | INTRAMUSCULAR | Status: DC | PRN

## 2019-07-22 MED ORDER — OXYTOCIN 40 UNITS IN NORMAL SALINE INFUSION - SIMPLE MED
1.0000 m[IU]/min | INTRAVENOUS | Status: DC
  Administered 2019-07-22: 2 m[IU]/min via INTRAVENOUS

## 2019-07-22 MED ORDER — FENTANYL-BUPIVACAINE-NACL 0.5-0.125-0.9 MG/250ML-% EP SOLN: 12.0000 mL/h | EPIDURAL | Status: DC | PRN

## 2019-07-22 NOTE — Progress Notes (Signed)
LABOR PROGRESS NOTE  Terri [name] is a 34 y.o. G2P1001 at [redacted]w[redacted]d admitted for IOL 2/2 CHTN w/ SI severe PEC (BP).  Subjective: Asymptomatic, resting comfortably in bed.  Objective: BP (!) 132/91   Pulse 85   Temp 97.6 F (36.4 C) (Oral)   Resp 16   Ht 5\' 8"  (1.727 m)   Wt 115.4 kg   LMP 11/04/2018   SpO2 98%   BMI 38.70 kg/m    Dilation: 4 Effacement (%): 60 Cervical Position: Posterior Station: -2 Presentation: Vertex Exam by:: Dr. Chauncey Reading Fetal monitoring: Baseline: 120 bpm, Variability: Good {> 6 bpm), Accelerations: Reactive, and Decelerations: Absent Uterine activity: Frequency: Every 4-7 minutes, Duration: 30-90 seconds, and Intensity: moderate  Labs: Lab Results  Component Value Date   WBC 6.4 07/21/2019   HGB 11.1 (L) 07/21/2019   HCT 34.2 (L) 07/21/2019   MCV 92.7 07/21/2019   PLT 203 07/21/2019    Patient Active Problem List   Diagnosis Date Noted  . Chronic hypertension with superimposed severe preeclampsia 01/31/2019  . Supervision of other high risk pregnancy, antepartum 01/07/2019  . Migraine 11/12/2014  . Mixed incontinence 10/03/2013  . Nephrolithiasis 06/18/2013  . Unspecified episodic mood disorder 04/05/2011  . TOBACCO USER 06/14/2009  . Obesity in pregnancy, antepartum 01/05/2009  . ASTHMA, INTERMITTENT 08/09/2006    Assessment / Plan: Terri [name] is a 35 y.o. G2P1001 at [redacted]w[redacted]d admitted for IOL 2/2 CHTN w/ SI severe PEC (BP).  #Labor: S/P cyto x3, and FB. Improved effacement. Plan to start pitocin after patient eats lunch. #Pain:  IV pain meds PRN, epidural upon request #FWB: Cat I #GBS/ID: negative #MOF: Breast and bottle #MOC: Unsure #Circ: Yes, outpatient #cHTNw/ SIPE (BP): cont labetalol 600mg  TID, cont Mg protocol.  No signs of toxicity, pt currently asymptomatic. Severe range BP's 160-170s/90-100s early this morning. Most recent BP mildly elevated 132/91. #Anticipated MOD: NSVD  Gladys Damme, MD [place] [facility], PGY-1 [facility] Faculty Teaching Service  07/22/2019, 1:47 PM

## 2019-07-22 NOTE — Progress Notes (Cosign Needed)
LABOR PROGRESS NOTE  Terri [name] is a 34 y.o. G2P1001 at [redacted]w[redacted]d admitted for IOL 2/2 CHTN w/ SI severe PEC (BP).  Subjective: Patient with headache, Fioricet ordered. Tylenol already administered 2 hours ago. Feeling mild contractions.  Objective: BP (!) 146/98   Pulse 94   Temp 98.7 F (37.1 C) (Oral)   Resp 17   Ht 5\' 8"  (1.727 m)   Wt 115.4 kg   LMP 11/04/2018   SpO2 98%   BMI 38.70 kg/m    Dilation: 2 Effacement (%): Thick Cervical Position: Posterior Station: -3 Presentation: Vertex Exam by:: Gracieann Stannard, MD Fetal monitoring: Baseline: 135 bpm, Variability: Good {> 6 bpm), Accelerations: Reactive, and Decelerations: Absent Uterine activity: Frequency: Every 4-7 minutes, Duration: 30-90 seconds, and Intensity: moderate  Labs: Lab Results  Component Value Date   WBC 6.4 07/21/2019   HGB 11.1 (L) 07/21/2019   HCT 34.2 (L) 07/21/2019   MCV 92.7 07/21/2019   PLT 203 07/21/2019    Patient Active Problem List   Diagnosis Date Noted  . Chronic hypertension with superimposed severe preeclampsia 01/31/2019  . Supervision of other high risk pregnancy, antepartum 01/07/2019  . Migraine 11/12/2014  . Mixed incontinence 10/03/2013  . Nephrolithiasis 06/18/2013  . Unspecified episodic mood disorder 04/05/2011  . TOBACCO USER 06/14/2009  . Obesity in pregnancy, antepartum 01/05/2009  . ASTHMA, INTERMITTENT 08/09/2006    Assessment / Plan: Terri DILTS is a 33 y.o. G2P1001 at [redacted]w[redacted]d admitted for IOL 2/2 CHTN w/ SI severe PEC (BP).  #Labor: Miso given at 2343, repeat. Recheck showed cervix @ 3cm dilation, will not place foley at this time. Consider pit discussion at next SVE. #Pain:  IV pain meds PRN, epidural upon request #FWB: Cat I #GBS/ID: negative #COVID: swab pending #MOF: Breast and bottle #MOC: Unsure #Circ: Yes, outpatient #cHTN: cont labetalol 600mg  TID, cont Mg drip and labetalol protocol.   #Anticipated MOD: NSVD   Terri Durand, DO, PGY-1 Family  Medicine Resident, [facility] Faculty Teaching Service  07/22/2019, 3:57 AM

## 2019-07-22 NOTE — Progress Notes (Signed)
LABOR PROGRESS NOTE  Terri [name] is a 34 y.o. G2P1001 at [redacted]w[redacted]d admitted for IOL 2/2 CHTN w/ SI severe PEC (BP).  Subjective: HA mildly better, resting comfortably in bed.  Objective: BP (!) 152/96   Pulse 79   Temp 97.6 F (36.4 C) (Oral)   Resp 16   Ht 5\' 8"  (1.727 m)   Wt 115.4 kg   LMP 11/04/2018   SpO2 98%   BMI 38.70 kg/m    Dilation: 3 Effacement (%): 50 Cervical Position: Posterior Station: -3 Presentation: Vertex Exam by:: Jeanann Lewandowsky RN Fetal monitoring: Baseline: 120 bpm, Variability: Good {> 6 bpm), Accelerations: Reactive, and Decelerations: Absent Uterine activity: Frequency: Every 4-7 minutes, Duration: 30-90 seconds, and Intensity: moderate  Labs: Lab Results  Component Value Date   WBC 6.4 07/21/2019   HGB 11.1 (L) 07/21/2019   HCT 34.2 (L) 07/21/2019   MCV 92.7 07/21/2019   PLT 203 07/21/2019    Patient Active Problem List   Diagnosis Date Noted  . Chronic hypertension with superimposed severe preeclampsia 01/31/2019  . Supervision of other high risk pregnancy, antepartum 01/07/2019  . Migraine 11/12/2014  . Mixed incontinence 10/03/2013  . Nephrolithiasis 06/18/2013  . Unspecified episodic mood disorder 04/05/2011  . TOBACCO USER 06/14/2009  . Obesity in pregnancy, antepartum 01/05/2009  . ASTHMA, INTERMITTENT 08/09/2006    Assessment / Plan: Terri [name] is a 34 y.o. G2P1001 at [redacted]w[redacted]d admitted for IOL 2/2 CHTN w/ SI severe PEC (BP).  #Labor: Cyto x 3, last at 0920 via buccal route. Most recent CVE 3/50/-3. If improved effacement at next check, plan to begin pitocin. #Pain:  IV pain meds PRN, epidural upon request #FWB: Cat I #GBS/ID: negative #COVID: negative #MOF: Breast and bottle #MOC: Unsure #Circ: Yes, outpatient #cHTNw/ SIPE (BP): cont labetalol 600mg  TID, cont Mg protocol.  No signs of toxicity, pt currently asymptomatic. Severe range BP's 160-170s/90-100s early this morning. Most recent BP mildly elevated 152/96.   #Anticipated MOD: NSVD  Terri Damme, MD [place] [facility], PGY-1 [facility] Faculty Teaching Service  07/22/2019, 10:21 AM

## 2019-07-22 NOTE — Progress Notes (Signed)
LABOR PROGRESS NOTE  Terri [name] is a 34 y.o. G2P1001 at [redacted]w[redacted]d admitted for IOL 2/2 CHTN w/ SI severe PEC (BP).  Subjective: Asymptomatic, resting comfortably in bed.  Objective: BP (!) 139/92   Pulse 75   Temp 98 F (36.7 C) (Oral)   Resp 16   Ht 5\' 8"  (1.727 m)   Wt 115.4 kg   LMP 11/04/2018   SpO2 98%   BMI 38.70 kg/m    Dilation: 4 Effacement (%): 60 Cervical Position: Posterior Station: -2 Presentation: Vertex Exam by:: Jeanann Lewandowsky RN Fetal monitoring: Baseline: 120 bpm, Variability: Good {> 6 bpm), Accelerations: Reactive, and Decelerations: Absent Uterine activity: Frequency: Every 4-7 minutes, Duration: 30-90 seconds, and Intensity: moderate  Labs: Lab Results  Component Value Date   WBC 5.2 07/22/2019   HGB 10.7 (L) 07/22/2019   HCT 32.5 (L) 07/22/2019   MCV 92.3 07/22/2019   PLT 201 07/22/2019    Patient Active Problem List   Diagnosis Date Noted  . Chronic hypertension with superimposed severe preeclampsia 01/31/2019  . Supervision of other high risk pregnancy, antepartum 01/07/2019  . Migraine 11/12/2014  . Mixed incontinence 10/03/2013  . Nephrolithiasis 06/18/2013  . Unspecified episodic mood disorder 04/05/2011  . TOBACCO USER 06/14/2009  . Obesity in pregnancy, antepartum 01/05/2009  . ASTHMA, INTERMITTENT 08/09/2006    Assessment / Plan: Terri [name] is a 34 y.o. G2P1001 at [redacted]w[redacted]d admitted for IOL 2/2 CHTN w/ SI severe PEC (BP).  #Labor: S/P cyto x3, and FB. Patient wanted to eat food before starting pitocin and shower. Pit started at 1607, currently on 36mu/min of pitocin and tolerating well. Plan to continue titrating pitocin. #Pain:  IV pain meds PRN, epidural upon request #FWB: Cat I #GBS/ID: negative #MOF: Breast and bottle #MOC: Unsure #Circ: Yes, outpatient #cHTNw/ SIPE (BP): cont labetalol 600mg  TID, cont Mg protocol.  No signs of toxicity, pt currently asymptomatic. Severe range BP's 160-170s/90-100s early this morning.  Most recent BP mildly elevated 139/92. #Anticipated MOD: NSVD  Gladys Damme, MD [place] [facility], PGY-1 [facility] Faculty Teaching Service  07/22/2019, 5:19 PM

## 2019-07-22 NOTE — Progress Notes (Signed)
LABOR PROGRESS NOTE  Terri [name] is a 34 y.o. G2P1001 at [redacted]w[redacted]d admitted for IOL 2/2 CHTN w/ SI severe PEC (BP).  Subjective: Endorsing a headache, would like to try fioricet again Starting to feel contractions more strongly  Objective: BP (!) 134/93   Pulse 81   Temp 98 F (36.7 C) (Oral)   Resp 16   Ht 5\' 8"  (1.727 m)   Wt 115.4 kg   LMP 11/04/2018   SpO2 98%   BMI 38.70 kg/m    Dilation: 4 Effacement (%): 60 Cervical Position: Posterior Station: -2 Presentation: Vertex Exam by:: Dr. Fayette Pho Fetal monitoring: Baseline: 125 bpm, Variability: Good {> 6 bpm), Accelerations: Reactive, and Decelerations: Absent Uterine activity: Frequency: Every 1-3 minutes  Labs: Lab Results  Component Value Date   WBC 5.2 07/22/2019   HGB 10.7 (L) 07/22/2019   HCT 32.5 (L) 07/22/2019   MCV 92.3 07/22/2019   PLT 201 07/22/2019    Patient Active Problem List   Diagnosis Date Noted  . Chronic hypertension with superimposed severe preeclampsia 01/31/2019  . Supervision of other high risk pregnancy, antepartum 01/07/2019  . Migraine 11/12/2014  . Mixed incontinence 10/03/2013  . Nephrolithiasis 06/18/2013  . Unspecified episodic mood disorder 04/05/2011  . TOBACCO USER 06/14/2009  . Obesity in pregnancy, antepartum 01/05/2009  . ASTHMA, INTERMITTENT 08/09/2006    Assessment / Plan: Terri [name] is a 34 y.o. G2P1001 at [redacted]w[redacted]d admitted for IOL 2/2 CHTN w/ SI severe PEC (BP).  #Labor: S/P cyto x3 and FB. Pit started at 1607, no change this exam. Plan to continue titrating pitocin, consider AROM next check. #Pain:  IV pain meds PRN, epidural upon request #FWB: Cat I #GBS/ID: negative #MOF: Breast and bottle #MOC: Unsure #Circ: Yes, outpatient  #cHTNw/ SIPE (BP, HA): cont labetalol 600mg  TID, cont Mg. No signs of toxicity. HA currently, will give fioricet and reassess. BP's mild range, ctm.   #Anticipated MOD: NSVD  Augustin Coupe, MD/[facility] OB Fellow 07/22/2019, 9:44 PM

## 2019-07-23 ENCOUNTER — Ambulatory Visit (HOSPITAL_COMMUNITY): Payer: Medicaid Other

## 2019-07-23 ENCOUNTER — Encounter (HOSPITAL_COMMUNITY): Payer: Self-pay

## 2019-07-23 ENCOUNTER — Encounter (HOSPITAL_COMMUNITY): Payer: Self-pay | Admitting: Obstetrics & Gynecology

## 2019-07-23 DIAGNOSIS — O114 Pre-existing hypertension with pre-eclampsia, complicating childbirth: Secondary | ICD-10-CM

## 2019-07-23 DIAGNOSIS — Z3A37 37 weeks gestation of pregnancy: Secondary | ICD-10-CM

## 2019-07-23 DIAGNOSIS — O1002 Pre-existing essential hypertension complicating childbirth: Secondary | ICD-10-CM

## 2019-07-23 MED ORDER — LABETALOL HCL 5 MG/ML IV SOLN
40.0000 mg | INTRAVENOUS | Status: DC | PRN
  Administered 2019-07-24: 40 mg via INTRAVENOUS
  Filled 2019-07-23: qty 8

## 2019-07-23 MED ORDER — MEASLES, MUMPS & RUBELLA VAC IJ SOLR: 0.5000 mL | Freq: Once | INTRAMUSCULAR | Status: DC

## 2019-07-23 MED ORDER — DIPHENHYDRAMINE HCL 25 MG PO CAPS: 25.0000 mg | ORAL_CAPSULE | Freq: Four times a day (QID) | ORAL | Status: DC | PRN

## 2019-07-23 MED ORDER — SIMETHICONE 80 MG PO CHEW: 80.0000 mg | CHEWABLE_TABLET | ORAL | Status: DC | PRN

## 2019-07-23 MED ORDER — METHYLERGONOVINE MALEATE 0.2 MG PO TABS: 0.2000 mg | ORAL_TABLET | ORAL | Status: DC | PRN

## 2019-07-23 MED ORDER — COCONUT OIL OIL: 1.0000 "application " | TOPICAL_OIL | Status: DC | PRN

## 2019-07-23 MED ORDER — BENZOCAINE-MENTHOL 20-0.5 % EX AERO
1.0000 "application " | INHALATION_SPRAY | CUTANEOUS | Status: DC | PRN
  Administered 2019-07-24: 1 via TOPICAL
  Filled 2019-07-23: qty 56

## 2019-07-23 MED ORDER — WITCH HAZEL-GLYCERIN EX PADS: 1.0000 "application " | MEDICATED_PAD | CUTANEOUS | Status: DC | PRN

## 2019-07-23 MED ORDER — LABETALOL HCL 5 MG/ML IV SOLN
20.0000 mg | INTRAVENOUS | Status: DC | PRN
  Administered 2019-07-24: 20 mg via INTRAVENOUS
  Filled 2019-07-23: qty 4

## 2019-07-23 MED ORDER — MAGNESIUM SULFATE 40 GM/1000ML IV SOLN
2.0000 g/h | INTRAVENOUS | Status: DC
  Administered 2019-07-23: 2 g/h via INTRAVENOUS
  Filled 2019-07-23: qty 1000

## 2019-07-23 MED ORDER — HYDRALAZINE HCL 10 MG PO TABS
10.0000 mg | ORAL_TABLET | Freq: Once | ORAL | Status: AC
  Administered 2019-07-24: 13:00:00 10 mg via ORAL
  Filled 2019-07-23: qty 1

## 2019-07-23 MED ORDER — METHYLERGONOVINE MALEATE 0.2 MG/ML IJ SOLN: 0.2000 mg | INTRAMUSCULAR | Status: DC | PRN

## 2019-07-23 MED ORDER — MISOPROSTOL 200 MCG PO TABS
400.0000 ug | ORAL_TABLET | Freq: Once | ORAL | Status: AC
  Administered 2019-07-23: 400 ug via ORAL

## 2019-07-23 MED ORDER — LACTATED RINGERS IV SOLN: INTRAVENOUS | Status: DC

## 2019-07-23 MED ORDER — SENNOSIDES-DOCUSATE SODIUM 8.6-50 MG PO TABS
2.0000 | ORAL_TABLET | ORAL | Status: DC
  Administered 2019-07-24 – 2019-07-25 (×3): 2 via ORAL
  Filled 2019-07-23 (×3): qty 2

## 2019-07-23 MED ORDER — MEDROXYPROGESTERONE ACETATE 150 MG/ML IM SUSP: 150.0000 mg | INTRAMUSCULAR | Status: DC | PRN

## 2019-07-23 MED ORDER — ONDANSETRON HCL 4 MG/2ML IJ SOLN: 4.0000 mg | INTRAMUSCULAR | Status: DC | PRN

## 2019-07-23 MED ORDER — PRENATAL MULTIVITAMIN CH
1.0000 | ORAL_TABLET | Freq: Every day | ORAL | Status: DC
  Administered 2019-07-23 – 2019-07-25 (×3): 1 via ORAL
  Filled 2019-07-23 (×4): qty 1

## 2019-07-23 MED ORDER — IBUPROFEN 600 MG PO TABS
600.0000 mg | ORAL_TABLET | Freq: Four times a day (QID) | ORAL | Status: DC
  Administered 2019-07-23 – 2019-07-24 (×6): 600 mg via ORAL
  Filled 2019-07-23 (×6): qty 1

## 2019-07-23 MED ORDER — LABETALOL HCL 200 MG PO TABS
600.0000 mg | ORAL_TABLET | Freq: Three times a day (TID) | ORAL | Status: DC
  Administered 2019-07-23 – 2019-07-24 (×4): 600 mg via ORAL
  Filled 2019-07-23 (×5): qty 3

## 2019-07-23 MED ORDER — ALUM & MAG HYDROXIDE-SIMETH 200-200-20 MG/5ML PO SUSP
30.0000 mL | Freq: Four times a day (QID) | ORAL | Status: DC | PRN
  Administered 2019-07-23: 30 mL via ORAL
  Filled 2019-07-23: qty 30

## 2019-07-23 MED ORDER — LABETALOL HCL 5 MG/ML IV SOLN
80.0000 mg | INTRAVENOUS | Status: DC | PRN
  Administered 2019-07-24: 80 mg via INTRAVENOUS
  Filled 2019-07-23: qty 16

## 2019-07-23 MED ORDER — DIBUCAINE (PERIANAL) 1 % EX OINT: 1.0000 "application " | TOPICAL_OINTMENT | CUTANEOUS | Status: DC | PRN

## 2019-07-23 MED ORDER — TETANUS-DIPHTH-ACELL PERTUSSIS 5-2.5-18.5 LF-MCG/0.5 IM SUSP: 0.5000 mL | Freq: Once | INTRAMUSCULAR | Status: DC

## 2019-07-23 MED ORDER — ONDANSETRON HCL 4 MG PO TABS: 4.0000 mg | ORAL_TABLET | ORAL | Status: DC | PRN

## 2019-07-23 MED ORDER — MISOPROSTOL 200 MCG PO TABS
ORAL_TABLET | ORAL | Status: AC
  Filled 2019-07-23: qty 2

## 2019-07-23 NOTE — Progress Notes (Signed)
Pt called out stating she did not want to be disturbed for anything.   Newborn has been in nursery due to [employer], maternal fatigue on magnesium and lack of assistance from FOB.

## 2019-07-23 NOTE — Progress Notes (Signed)
LABOR PROGRESS NOTE  Terri [name] is a 34 y.o. G2P1001 at [redacted]w[redacted]d admitted for IOL 2/2 CHTN w/ SI severe PEC (BP).  Subjective: On the birthing ball, states she is doing considerably more pain during her contractions.  Will defer cervical check until she is off the birthing ball.  No complaints of headache right now.  Objective: BP (!) 150/90   Pulse 86   Temp (!) 97.3 F (36.3 C) (Oral)   Resp 16   Ht 5\' 8"  (1.727 m)   Wt 115.4 kg   LMP 11/04/2018   SpO2 98%   BMI 38.70 kg/m    Dilation: 4 Effacement (%): 60 Cervical Position: Posterior Station: -2 Presentation: Vertex Exam by:: Dr. Fayette Pho Fetal monitoring: Baseline: 125 bpm, Variability: Good {> 6 bpm), Accelerations: Reactive, and Decelerations: Absent Uterine activity: Frequency: Every 2-4 minutes  Labs: Lab Results  Component Value Date   WBC 5.2 07/22/2019   HGB 10.7 (L) 07/22/2019   HCT 32.5 (L) 07/22/2019   MCV 92.3 07/22/2019   PLT 201 07/22/2019    Patient Active Problem List   Diagnosis Date Noted  . Chronic hypertension with superimposed severe preeclampsia 01/31/2019  . Supervision of other high risk pregnancy, antepartum 01/07/2019  . Migraine 11/12/2014  . Mixed incontinence 10/03/2013  . Nephrolithiasis 06/18/2013  . Unspecified episodic mood disorder 04/05/2011  . TOBACCO USER 06/14/2009  . Obesity in pregnancy, antepartum 01/05/2009  . ASTHMA, INTERMITTENT 08/09/2006    Assessment / Plan: Terri [name] is a 34 y.o. G2P1001 at [redacted]w[redacted]d admitted for IOL 2/2 CHTN w/ SI severe PEC (BP).  #Labor: S/P cyto x3 and FB. Pit started at 1607.  On birthing ball, will defer exam until she is off the birthing ball.  Continue titrating pitocin.  Attempt AROM at next check. #Pain:  IV pain meds PRN, epidural upon request #FWB: Cat I #GBS/ID: negative #MOF: Breast and bottle #MOC: Unsure #Circ: Yes, outpatient  #cHTNw/ SIPE (BP, HA): cont labetalol 600mg  TID, cont Mg. No signs of toxicity.  Blood  pressure still in the mild range, no more complaints of headache at this time.  #Anticipated MOD: NSVD  Terri Durand, DO (PGY-1) Family Medicine Resident, OB Teaching Service 07/23/2019, 12:14 AM

## 2019-07-23 NOTE — Discharge Summary (Addendum)
Postpartum Discharge Summary  Date of Service updated 07/26/19     Patient Name: Terri [name] DOB: 10/21/1985 MRN: [id number]  Date of admission: 07/21/2019 Delivering Provider: Floyce Stakes   Date of discharge: 07/26/2019 Admitting diagnosis: Chronic hypertension affecting pregnancy [O10.919] Intrauterine pregnancy: [redacted]w[redacted]d    Secondary diagnosis:  Principal Problem:   Chronic hypertension with superimposed severe preeclampsia Active Problems:   Obesity in pregnancy, antepartum   Supervision of other high risk pregnancy, antepartum  Additional problems: anemia     Discharge diagnosis: Term Pregnancy Delivered, Preeclampsia (severe) and CHTN with superimposed preeclampsia                                                                                                Post partum procedures:management of BPs  Augmentation: AROM, Pitocin and Cytotec  Complications: None  Hospital course:  Induction of Labor With Vaginal Delivery   34y.o. yo G2P2002 at 34w2das admitted to the hospital 07/21/2019 for induction of labor.  Indication for induction: cHTN with superimposed pre-eclampsia with severe features by blood pressure.  Patient had an uncomplicated labor course as follows: induced with misoprostol x3, pitocin, and AROM and progressed to precipitous NSVD.  Membrane Rupture Time/Date: 4:00 AM ,07/23/2019   Intrapartum Procedures: Episiotomy: None [1]                                         Lacerations:  1st degree [2];Periurethral [8]  Patient had delivery of a Viable infant.  Information for the patient's newborn:  MaLaurene Footman0[[id number]]Delivery Method: Vag-Spont    07/23/2019  Details of delivery can be found in separate delivery note.  Patient had a routine postpartum course. Patient is discharged home 08/17/19. Delivery time: 5:50 AM    Magnesium Sulfate received: Yes BMZ received: No Rhophylac:N/A MMR:N/A Transfusion:No  Physical exam  Vitals:    07/25/19 2306 07/26/19 0400 07/26/19 0401 07/26/19 0747  BP: (!) 151/89  133/78 126/83  Pulse: 97  93 93  Resp: _0 Temp: 98.1 F (36.7 C)  98 F (36.7 C) 98.3 F (36.8 C)  TempSrc: Oral  Oral Oral  SpO2: 99% 99%  98%  Weight:      Height:       General: alert Lochia: appropriate Uterine Fundus: firm Incision: N/A DVT Evaluation: No evidence of DVT seen on physical exam. Labs: Lab Results  Component Value Date   WBC 8.4 07/24/2019   HGB 9.8 (L) 07/24/2019   HCT 29.8 (L) 07/24/2019   MCV 93.7 07/24/2019   PLT 206 07/24/2019   CMP Latest Ref Rng & Units 07/24/2019  Glucose 70 - 99 mg/dL 121(H)  BUN 6 - 20 mg/dL 7  Creatinine 0.44 - 1.00 mg/dL 0.97  Sodium 135 - 145 mmol/L 141  Potassium 3.5 - 5.1 mmol/L 3.9  Chloride 98 - 111 mmol/L 108  CO2 22 - 32 mmol/L 24  Calcium 8.9 - 10.3 mg/dL 8.2(L)  Total Protein  6.5 - 8.1 g/dL 5.4(L)  Total Bilirubin 0.3 - 1.2 mg/dL 0.1(L)  Alkaline Phos 38 - 126 U/L 78  AST 15 - 41 U/L 23  ALT 0 - 44 U/L 15   Edinburgh Score: Edinburgh Postnatal Depression Scale Screening Tool 07/25/2019  I have been able to laugh and see the funny side of things. 0  I have looked forward with enjoyment to things. 0  I have blamed myself unnecessarily when things went wrong. 2  I have been anxious or worried for no good reason. 3  I have felt scared or panicky for no good reason. 2  Things have been getting on top of me. 1  I have been so unhappy that I have had difficulty sleeping. 2  I have felt sad or miserable. 2  I have been so unhappy that I have been crying. 1  The thought of harming myself has occurred to me. 0  Edinburgh Postnatal Depression Scale Total 13    Discharge instruction: per After Visit Summary and "Baby and Me Booklet".  After visit meds:    Diet: routine diet  Activity: Advance as tolerated. Pelvic rest for 6 weeks.   Outpatient follow up: 1 week for BP check and 6 weeks for PP visit Follow up Appt: Future  Appointments  Date Time Provider Department Center  08/20/2019  2:00 PM Ervin, Michael L, MD [facility] None   Follow up Visit: Follow-up Information    [facility]. Schedule an appointment as soon as possible for a visit in 1 week(s).   Specialty: Obstetrics and Gynecology Why: BP check and 6 weeks for postpartum exam Contact information: [address], [address] [place] [address] [phone]           Please schedule this patient for Postpartum visit in: 6 weeks with the following provider: Any provider Virtual For C/S patients schedule nurse incision check in weeks 2 weeks: no High risk pregnancy complicated by: pre-eclampsia with severe features Delivery mode:  SVD Anticipated Birth Control:  other/unsure PP Procedures needed: BP check  Schedule Integrated BH visit: no   Newborn Data: Live born female  Birth Weight: 6 lb 2 oz (2778 g) APGAR: 6, 8  Newborn Delivery   Birth date/time: 07/23/2019 05:50:00 Delivery type: Vaginal, Spontaneous      Baby Feeding: Bottle and Breast Disposition:awaiting bilirubin level   08/17/2019  S , MD   

## 2019-07-23 NOTE — Progress Notes (Signed)
LABOR PROGRESS NOTE  Terri [name] is a 34 y.o. G2P1001 at [redacted]w[redacted]d admitted for IOL 2/2 CHTN w/ SI severe PEC (BP).  Subjective: Patient still feeling regular, painful contractions though now she states she has been able to sleep.  Amenable to AROM.  Objective: BP (!) 150/95   Pulse 87   Temp (!) 97.4 F (36.3 C) (Axillary)   Resp 16   Ht 5\' 8"  (1.727 m)   Wt 115.4 kg   LMP 11/04/2018   SpO2 98%   BMI 38.70 kg/m    Dilation: 4 Effacement (%): 60 Cervical Position: Posterior Station: -2 Presentation: Vertex Exam by:: Dr. Dione Plover Fetal monitoring: Baseline: 125 bpm, Variability: Good {> 6 bpm), Accelerations: Reactive, and Decelerations: Absent Uterine activity: Frequency: Every 2-4 minutes  Labs: Lab Results  Component Value Date   WBC 5.2 07/22/2019   HGB 10.7 (L) 07/22/2019   HCT 32.5 (L) 07/22/2019   MCV 92.3 07/22/2019   PLT 201 07/22/2019    Patient Active Problem List   Diagnosis Date Noted  . Chronic hypertension with superimposed severe preeclampsia 01/31/2019  . Supervision of other high risk pregnancy, antepartum 01/07/2019  . Migraine 11/12/2014  . Mixed incontinence 10/03/2013  . Nephrolithiasis 06/18/2013  . Unspecified episodic mood disorder 04/05/2011  . TOBACCO USER 06/14/2009  . Obesity in pregnancy, antepartum 01/05/2009  . ASTHMA, INTERMITTENT 08/09/2006    Assessment / Plan: Terri [name] is a 34 y.o. G2P1001 at [redacted]w[redacted]d admitted for IOL 2/2 CHTN w/ SI severe PEC (BP).  #Labor: S/P cyto x3 and FB. Pit started at 1607.  Cervical check shows no change from previous, AROM performed at 0400 with clear/pink fluid.  Will continue uptitrating Pitocin. #Pain:  IV pain meds PRN, epidural. #FWB: Cat I #GBS/ID: negative #MOF: Breast and bottle #MOC: Unsure #Circ: Yes, outpatient  #cHTNw/ SIPE (BP, HA): cont labetalol 600mg  TID, cont Mg. No signs of toxicity.  Last blood pressure 150/95 with no complaints of headache or vision  changes.  #Anticipated MOD: NSVD  Glenna Durand, DO (PGY-1) Family Medicine Resident, OB Teaching Service 07/23/2019, 4:14 AM

## 2019-07-23 NOTE — Lactation Note (Signed)
This note was copied from a baby's chart. Lactation Consultation Note  Patient Name: Terri [name] [id number] Date: 07/23/2019 Reason for consult: Initial assessment;Early term 62-38.6wks Baby is 37.2 weeks/4 hours old.  Baby is currently in the nursery.  RN recently assisted with latching baby to breast but baby not showing interest.  Warm packs were applied to breasts and massage done prior to hand expression.  Instructed mom on hand expression.  Mom very tired and falling asleep.  I assisted with hand expression and 5=6 drops obtained from right side and 2 small drops from left.  Milk taken to nursery and finger fed to baby.  Baby has a strong suck on gloved finger.  Instructed to feed with cues and at least 8 times/24 hours  Encouraged to call for assist prn.  Maternal Data Has patient been taught Hand Expression?: Yes Does the patient have breastfeeding experience prior to this delivery?: Yes  Feeding Feeding Type: Breast Milk  LATCH Score Latch: Repeated attempts needed to sustain latch, nipple held in mouth throughout feeding, stimulation needed to elicit sucking reflex.  Audible Swallowing: None  Type of Nipple: Everted at rest and after stimulation  Comfort (Breast/Nipple): Soft / non-tender  Hold (Positioning): Assistance needed to correctly position infant at breast and maintain latch.  LATCH Score: 6  Interventions Interventions: Breast feeding basics reviewed;Assisted with latch;Skin to skin;Breast massage;Support pillows;Adjust position  Lactation Tools Discussed/Used     Consult Status Consult Status: Follow-up Date: 07/24/19 Follow-up type: In-patient    Ave Filter 07/23/2019, 10:20 AM

## 2019-07-24 ENCOUNTER — Encounter: Payer: Medicaid Other | Admitting: Obstetrics and Gynecology

## 2019-07-24 ENCOUNTER — Encounter: Payer: Self-pay | Admitting: Obstetrics and Gynecology

## 2019-07-24 LAB — CBC
HCT: 29.8 % — ABNORMAL LOW (ref 36.0–46.0)
Hemoglobin: 9.8 g/dL — ABNORMAL LOW (ref 12.0–15.0)
MCH: 30.8 pg (ref 26.0–34.0)
MCHC: 32.9 g/dL (ref 30.0–36.0)
MCV: 93.7 fL (ref 80.0–100.0)
Platelets: 206 10*3/uL (ref 150–400)
RBC: 3.18 MIL/uL — ABNORMAL LOW (ref 3.87–5.11)
RDW: 13.3 % (ref 11.5–15.5)
WBC: 8.4 10*3/uL (ref 4.0–10.5)
nRBC: 0 % (ref 0.0–0.2)

## 2019-07-24 LAB — COMPREHENSIVE METABOLIC PANEL
ALT: 15 U/L (ref 0–44)
AST: 23 U/L (ref 15–41)
Albumin: 2.4 g/dL — ABNORMAL LOW (ref 3.5–5.0)
Alkaline Phosphatase: 78 U/L (ref 38–126)
Anion gap: 9 (ref 5–15)
BUN: 7 mg/dL (ref 6–20)
CO2: 24 mmol/L (ref 22–32)
Calcium: 8.2 mg/dL — ABNORMAL LOW (ref 8.9–10.3)
Chloride: 108 mmol/L (ref 98–111)
Creatinine, Ser: 0.97 mg/dL (ref 0.44–1.00)
GFR calc Af Amer: >60 mL/min (ref >60)
GFR calc non Af Amer: >60 mL/min (ref >60)
Glucose, Bld: 121 mg/dL — ABNORMAL HIGH (ref 70–99)
Potassium: 3.9 mmol/L (ref 3.5–5.1)
Sodium: 141 mmol/L (ref 135–145)
Total Bilirubin: 0.1 mg/dL — ABNORMAL LOW (ref 0.3–1.2)
Total Protein: 5.4 g/dL — ABNORMAL LOW (ref 6.5–8.1)

## 2019-07-24 MED ORDER — HYDRALAZINE HCL 50 MG PO TABS
50.0000 mg | ORAL_TABLET | Freq: Three times a day (TID) | ORAL | Status: DC
  Administered 2019-07-24 – 2019-07-26 (×6): 50 mg via ORAL
  Filled 2019-07-24 (×6): qty 1

## 2019-07-24 MED ORDER — NIFEDIPINE ER OSMOTIC RELEASE 30 MG PO TB24
30.0000 mg | ORAL_TABLET | Freq: Once | ORAL | Status: AC
  Administered 2019-07-24: 30 mg via ORAL
  Filled 2019-07-24: qty 1

## 2019-07-24 MED ORDER — NIFEDIPINE ER OSMOTIC RELEASE 30 MG PO TB24
60.0000 mg | ORAL_TABLET | Freq: Two times a day (BID) | ORAL | Status: DC
  Administered 2019-07-24 – 2019-07-26 (×4): 60 mg via ORAL
  Filled 2019-07-24 (×5): qty 2

## 2019-07-24 MED ORDER — LABETALOL HCL 200 MG PO TABS
800.0000 mg | ORAL_TABLET | Freq: Three times a day (TID) | ORAL | Status: DC
  Administered 2019-07-24 – 2019-07-26 (×6): 800 mg via ORAL
  Filled 2019-07-24 (×5): qty 4

## 2019-07-24 MED ORDER — NIFEDIPINE ER OSMOTIC RELEASE 30 MG PO TB24
30.0000 mg | ORAL_TABLET | Freq: Every day | ORAL | Status: DC
  Administered 2019-07-24: 30 mg via ORAL
  Filled 2019-07-24: qty 1

## 2019-07-24 NOTE — Progress Notes (Signed)
Pt with severe range BP, on protocol, have increased her regimen to procardia 60 mg XL.   Feliz Beam, M.D. Attending Center for [employer] Fish farm manager)

## 2019-07-24 NOTE — Lactation Note (Signed)
This note was copied from a baby's chart. Lactation Consultation Note  Patient Name: Terri [name] [id number] Date: 07/24/2019 Reason for consult: Follow-up assessment;Early term 53-38.6wks  1555 [facility] RN called, states pt requests [place] visit at this time  [phone] F/U visit with P2 mom who delivered @ 37.2wks, baby is now 29 hrs old with 6% wt loss.  LC entered room to find mom in bed with baby, burping him; states she just supplemented her last breastfeed with 24ml formula. Mom states her breasts are tender like she is starting to make mature milk. Mom requests LC observe pumping and critique/troubleshoot technique.  Mom states she can hand expression a few drops from her right breast and she gets a few drops out when she pumps, but mom concerned because nothing comes out of left breast either by hand or pump.  Mom states her [facility] office called her today and states the hospital can give her a pump until she can get DEBP from [facility].  Mom with very large breasts with erect nipples and reports breast enlargement during her pregnancy. Mom states she was able to breastfeed her 34 yo son for 6 months before going back to work and described difficulty with keeping up with pumping and working. Mom reports adequate supply until that time. Mom with strong desire for infant to have exclusive breastmilk and appears dedicated to pumping if necessary but would rather feed from the breast.. Mom was on Magnesium infusion during labor for high blood pressure that was d/c earlier today.  Mom reports knowledge of disassembly and cleaning of pump after each use. Mom assembled pump and initiated pumping. Reviewed pump function on initiation mode, increasing suction, allowing for pump pauses, etc. LC observed pumping x10 minutes. Small drops expressed after several minutes of pumping on right breast but not so much as a glistening noted on left breast. Mom continues to pump upon LC exit, will wait for pump to complete  pumping cycle.   Encouraged mom to continue to offer breast with each feeding and then pump afterwards, q3hrs. Reviewed typical interval from birth to lactogenesis and potential for delay r/t Magnesium. Mom with supplementation guidelines at bedside and very engaged in following feeding plan.  [facility] referral faxed and informed mom about [facility] loaner process r/t possible discharge close to/on the weekend. Mom states she has the cash available and desires WIC loaner DEBP if [facility] does not get back to her prior to her d/c.   Reviewed IP lactation services and encouraged to call with any further questions/concerns.   Maternal Data Does the patient have breastfeeding experience prior to this delivery?: Yes  Feeding Feeding Type: Bottle Fed - Formula  LATCH Score                   Interventions Interventions: Breast feeding basics reviewed;DEBP  Lactation Tools Discussed/Used Breast pump type: Double-Electric Breast Pump WIC Program: Yes Pump Review: Setup, frequency, and cleaning   Consult Status Consult Status: Follow-up Date: 07/25/19 Follow-up type: In-patient    Cranston Neighbor 07/24/2019, 4:46 PM
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
## 2019-07-24 NOTE — Lactation Note (Signed)
This note was copied from a baby's chart. Lactation Consultation Note Baby 32 hrs old. Mom requested LC d/t unable to latch baby. When LC came to rm. Baby was latched and BF well. Baby had good positioning. No swallows heard. Mom stated + breast changes during pregnancy.  Mom has Large pendulous breast. Compressible everted nipples.  LC hand expressed w/no colostrum noted. Worked w/mom on hand expression. No colostrum noted. Mom had DEBP in rm. [place]  Set up DEBP and shown mom how to use DEBP & how to disassemble, clean, & reassemble parts. Mom knows to pump q3h for 15-20 min. Mom encouraged to feed baby 8-12 times/24 hours and with feeding cues. Mom encouraged to waken baby for feeds if hasn't cued in 3 hrs. When baby finished BF, noted suck blisters to lips. Baby voided, LC changed diaper.. Mom using DEBP. Baby cueing. Encouraged mom to put baby back on the breast. Discussed newborn feeding habits and behavior. Discussed lengths of feedings as well. Encouraged mom to hand express after pumping for stimulation. Encouraged to call for assistance or concerns.  Mom BF her now 89 yr old for 6 months.  Patient Name: Terri [name] [id number] Date: 07/24/2019 Reason for consult: Mother's request;Difficult latch;Early term 37-38.6wks   Maternal Data Does the patient have breastfeeding experience prior to this delivery?: Yes  Feeding Feeding Type: Breast Fed  LATCH Score Latch: Grasps breast easily, tongue down, lips flanged, rhythmical sucking.  Audible Swallowing: None  Type of Nipple: Everted at rest and after stimulation  Comfort (Breast/Nipple): Soft / non-tender  Hold (Positioning): No assistance needed to correctly position infant at breast.  LATCH Score: 8  Interventions Interventions: Breast feeding basics reviewed;Support pillows;Breast massage;Hand express;Breast compression;DEBP  Lactation Tools Discussed/Used Tools: Pump Breast pump type: Double-Electric Breast  Pump Pump Review: Setup, frequency, and cleaning;Milk Storage Initiated by:: Allayne Stack RN IBCLC Date initiated:: 07/24/19   Consult Status Consult Status: Follow-up Date: 07/25/19 Follow-up type: In-patient    Theodoro Kalata 07/24/2019, 5:44 AM

## 2019-07-24 NOTE — Lactation Note (Signed)
This note was copied from a baby's chart. Lactation Consultation Note  Patient Name: Terri [name] [id number] Date: 07/24/2019   Per [facility] RN, pt does not want to be disturbed at this time. LC will attempt later  Cranston Neighbor 07/24/2019, 3:29 PM

## 2019-07-24 NOTE — Progress Notes (Signed)
Faculty Attending Note  Post Partum Day 1  Subjective: Patient is feeling well. She reports moderately well controlled pain on PO pain meds. She is ambulating and denies light-headedness or dizziness. She is passing flatus. She is tolerating a regular diet without nausea/vomiting. Bleeding is moderate. She is breast feeding. Baby is in room and doing well.  Objective: Blood pressure (!) 171/103, pulse 91, temperature 97.8 F (36.6 C), temperature source Oral, resp. rate 20, height 5\' 8"  (1.727 m), weight 115.4 kg, last menstrual period 11/04/2018, SpO2 100 %, unknown if currently breastfeeding. Temp:  [97.8 F (36.6 C)-98.5 F (36.9 C)] 97.8 F (36.6 C) (02/11 0752) Pulse Rate:  [84-98] 91 (02/11 0916) Resp:  [18-20] 20 (02/11 0752) BP: (137-181)/(88-106) 171/103 (02/11 0916) SpO2:  [98 %-100 %] 100 % (02/11 0752)  Physical Exam:  General: alert, oriented, cooperative Chest: normal respiratory effort Heart: RRR  Abdomen: soft, appropriately tender to palpation  Uterine Fundus: firm, 2 fingers below the umbilicus Lochia: moderate, rubra DVT Evaluation: no evidence of DVT Extremities: mild bilateral lower extremity edema, no calf tenderness  UOP: voiding spontaneously  Recent Labs    07/21/19 2221 07/22/19 1619  HGB 11.1* 10.7*  HCT 34.2* 32.5*    Assessment/Plan: Patient Active Problem List   Diagnosis Date Noted  . Chronic hypertension with superimposed severe preeclampsia 01/31/2019  . Supervision of other high risk pregnancy, antepartum 01/07/2019  . Migraine 11/12/2014  . Mixed incontinence 10/03/2013  . Nephrolithiasis 06/18/2013  . Unspecified episodic mood disorder 04/05/2011  . TOBACCO USER 06/14/2009  . Obesity in pregnancy, antepartum 01/05/2009  . ASTHMA, INTERMITTENT 08/09/2006    Patient is 34 y.o. [id number] PPD#1 s/p SVD at [redacted]w[redacted]d. Course c/b chronic hypertension with superimposed severe pre-eclampsia. S/p 24 hr magnesium therapy this am. She is doing  well, recovering appropriately and complains only of some pain. Mild dizziness improved since mag is off. BP not well controlled yet, will add procardia 30 mg XL this am   Procardia 30 mg XL daily Cont labetalol 600 mg TID Continue routine post partum care Pain meds prn Regular diet Declines birth control Plan for discharge possibly tomorrow    K. Arvilla Meres, M.D. Attending Center for [employer] (Faculty Practice)  07/24/2019, 10:03 AM

## 2019-07-25 MED ORDER — IBUPROFEN 800 MG PO TABS
800.0000 mg | ORAL_TABLET | Freq: Three times a day (TID) | ORAL | Status: DC | PRN
  Administered 2019-07-25 – 2019-07-26 (×2): 800 mg via ORAL
  Filled 2019-07-25 (×2): qty 1

## 2019-07-25 MED ORDER — ACETAMINOPHEN 500 MG PO TABS
1000.0000 mg | ORAL_TABLET | Freq: Four times a day (QID) | ORAL | Status: DC | PRN
  Administered 2019-07-26: 1000 mg via ORAL
  Filled 2019-07-25: qty 2

## 2019-07-25 MED ORDER — BUTALBITAL-APAP-CAFFEINE 50-325-40 MG PO TABS
2.0000 | ORAL_TABLET | Freq: Four times a day (QID) | ORAL | Status: DC | PRN
  Administered 2019-07-25: 2 via ORAL
  Filled 2019-07-25: qty 2

## 2019-07-25 MED ORDER — POLYETHYLENE GLYCOL 3350 17 G PO PACK
17.0000 g | PACK | Freq: Every day | ORAL | Status: DC
  Administered 2019-07-26: 17 g via ORAL
  Filled 2019-07-25: qty 1

## 2019-07-25 NOTE — Lactation Note (Signed)
This note was copied from a baby's chart. Lactation Consultation Note  Patient Name: Terri [name] [id number] Date: 07/25/2019 Reason for consult: Follow-up assessment;Early term 29-38.6wks  P2 mother whose infant is now 73 hours old.  This is an ETI at 37+2 weeks.  Mother's feeding preference on admission was breast/bottle.  Mother was bottle feeding when I arrived.  Offered to assist with latching, however, mother politely declined.  She stated that her breasts are full and "it hurts." She also took a shower a short time ago.  Discussed milk coming to volume and, upon breast assessment, mother's breasts are filling but not engorged at all.  Suggested mother use her DEBP and pump for 15 minutes after feeding baby.  Encouraged mother to put baby to the breast with every feeding prior to giving any formula.  Mother states that her goal is to exclusively breast feed so I educated her on the importance of getting baby latched and feeding at the breast.  Offered to assist with latching at the next feeding and that she can also obtain assistance from her RN.  When formula feeding, baby has been consuming adequate volumes and mother has no difficulty feeding him.  She is using the gold slow flow nipple.    Mother received a phone call from the [facility] office while I was in her room.  She will be having an appointment next week to obtain her DEBP.  Explained our [facility] loaner program and mother will use our service to obtain a pump until she can complete and obtain her pump from [facility].  Mother knows to have $30 cash in exact change tomorrow and will fill out the paperwork tomorrow morning to obtain the pump.  Wrote a reminder on her white board in the room.  Mother was not ready to make an appointment at this time with [facility] stating she was "overwhelmed" and had to make an appointment for a pediatric visit and a circumcision visit first.  [facility] representative informed her that she will follow up with a  return phone call next week.  Mother verbalized understanding.  I suggested that mother call the [facility] office as soon as possible after she makes her other appointments.    Father present and asleep on the couch.  Mother will call for my assistance as needed.   Maternal Data Formula Feeding for Exclusion: Yes Reason for exclusion: Mother's choice to formula and breast feed on admission Has patient been taught Hand Expression?: Yes Does the patient have breastfeeding experience prior to this delivery?: Yes  Feeding Feeding Type: Bottle Fed - Formula Nipple Type: Slow - flow  LATCH Score                   Interventions    Lactation Tools Discussed/Used WIC Program: Yes   Consult Status Consult Status: Follow-up Date: 07/26/19 Follow-up type: In-patient    Little Ishikawa 07/25/2019, 9:41 AM

## 2019-07-25 NOTE — Progress Notes (Signed)
Dr. Ilda Basset notified of pt's severe range BPs and PO medications given. Per MD, RN to recheck BP @0030  and notify MD.

## 2019-07-25 NOTE — Progress Notes (Signed)
Daily Antepartum Note  Admission Date: 07/21/2019 Current Date: 07/25/2019 7:48 AM  Terri [name] is a 34 y.o. [id number] PPD#2 SVD @ 37/w, admitted for IOL for poorly controlled cHTN  Pregnancy complicated by: Patient Active Problem List   Diagnosis Date Noted  . Chronic hypertension with superimposed severe preeclampsia 01/31/2019  . Supervision of other high risk pregnancy, antepartum 01/07/2019  . Migraine 11/12/2014  . Mixed incontinence 10/03/2013  . Nephrolithiasis 06/18/2013  . Unspecified episodic mood disorder 04/05/2011  . Poorly-controlled hypertension 03/21/2011  . TOBACCO USER 06/14/2009  . Obesity in pregnancy, antepartum 01/05/2009  . ASTHMA, INTERMITTENT 08/09/2006    Overnight/24hr events:  Added hydralazine 50 q8h  Subjective:  No s/s of pre-eclampsia. Meeting all PP goals  Objective:    Current Vital Signs 24h Vital Sign Ranges  T 97.9 F (36.6 C) Temp  Avg: 98 F (36.7 C)  Min: 97.8 F (36.6 C)  Max: 98.5 F (36.9 C)  BP 138/90 BP  Min: 138/90  Max: 186/109  HR 89 Pulse  Avg: 86.6  Min: 71  Max: 99  RR 18 Resp  Avg: 18.5  Min: 17  Max: 20  SaO2 99 % Room Air SpO2  Avg: 98.8 %  Min: 97 %  Max: 100 %       24 Hour I/O Current Shift I/O  Time Ins Outs 02/11 0701 - 02/12 0700 In: 780 [P.O.:780] Out: 300 [Urine:300] No intake/output data recorded.   Patient Vitals for the past 24 hrs:  BP Temp Temp src Pulse Resp SpO2  07/25/19 0500 138/90 97.9 F (36.6 C) Oral 89 18 99 %  07/25/19 0031 (!) 158/98 -- -- 93 -- --  07/25/19 0000 (!) 174/98 -- -- 98 17 --  07/24/19 2339 (!) 167/99 98.5 F (36.9 C) Oral 99 18 99 %  07/24/19 2337 (!) 177/102 -- -- 96 -- --  07/24/19 2335 (!) 182/87 -- -- 98 -- --  07/24/19 1953 (!) 158/96 97.9 F (36.6 C) Oral 83 18 99 %  07/24/19 1648 (!) 141/87 -- -- 96 -- --  07/24/19 1534 (!) 163/77 -- -- 93 -- --  07/24/19 1423 (!) 168/110 -- -- 78 -- --  07/24/19 1324 (!) 166/102 -- -- 74 -- --  07/24/19 1316 (!) 165/103 --  -- 74 -- --  07/24/19 1311 (!) 175/106 -- -- 83 -- --  07/24/19 1246 (!) 172/100 -- -- 71 -- --  07/24/19 1237 (!) 155/91 -- -- 80 -- --  07/24/19 1217 (!) 181/104 -- -- 78 -- --  07/24/19 1200 (!) 186/109 -- -- 86 20 97 %  07/24/19 1034 (!) 155/84 -- -- 88 -- --  07/24/19 0916 (!) 171/103 -- -- 91 -- --  07/24/19 0811 (!) 169/106 -- -- 89 -- --  07/24/19 0752 (!) 181/102 97.8 F (36.6 C) Oral 84 20 100 %  07/24/19 0751 (!) 171/106 -- -- 85 -- --    Physical exam: General: Well nourished, well developed female in no acute distress. Abdomen: nttp Cardiovascular: S1, S2 normal, no murmur, rub or gallop, regular rate and rhythm Respiratory: CTAB Extremities: no clubbing, cyanosis or edema Skin: Warm and dry.   Medications: Current Facility-Administered Medications  Medication Dose Route Frequency Provider Last Rate Last Admin  . alum & mag hydroxide-simeth (MAALOX/MYLANTA) 200-200-20 MG/5ML suspension 30 mL  30 mL Oral Q6H PRN Florian Buff, MD   30 mL at 07/23/19 2131  . benzocaine-Menthol (DERMOPLAST) 20-0.5 % topical spray 1  application  1 application Topical PRN Pollyann Samples, DO   1 application at 12/75/17 2209  . butalbital-acetaminophen-caffeine (FIORICET) 50-325-40 MG per tablet 2 tablet  2 tablet Oral Q6H PRN Chauncey Mann, MD   2 tablet at 07/25/19 0545  . coconut oil  1 application Topical PRN Pollyann Samples, DO      . witch hazel-glycerin (TUCKS) pad 1 application  1 application Topical PRN Murcko, Waldo Laine, DO       And  . dibucaine (NUPERCAINAL) 1 % rectal ointment 1 application  1 application Rectal PRN Pollyann Samples, DO      . diphenhydrAMINE (BENADRYL) capsule 25 mg  25 mg Oral Q6H PRN Pollyann Samples, DO      . hydrALAZINE (APRESOLINE) tablet 50 mg  50 mg Oral Q8H Aletha Halim, MD   50 mg at 07/25/19 0545  . labetalol (NORMODYNE) injection 20 mg  20 mg Intravenous PRN Sloan Leiter, MD   20 mg at 07/24/19 1222   And  . labetalol (NORMODYNE)  injection 40 mg  40 mg Intravenous PRN Sloan Leiter, MD   40 mg at 07/24/19 1250   And  . labetalol (NORMODYNE) injection 80 mg  80 mg Intravenous PRN Sloan Leiter, MD   80 mg at 07/24/19 1314  . labetalol (NORMODYNE) tablet 800 mg  800 mg Oral TID Sloan Leiter, MD   800 mg at 07/24/19 2355  . measles, mumps & rubella vaccine (MMR) injection 0.5 mL  0.5 mL Subcutaneous Once Pollyann Samples, DO      . medroxyPROGESTERone (DEPO-PROVERA) injection 150 mg  150 mg Intramuscular Prior to discharge Pollyann Samples, DO      . NIFEdipine (PROCARDIA-XL/NIFEDICAL-XL) 24 hr tablet 60 mg  60 mg Oral BID Sloan Leiter, MD   60 mg at 07/24/19 2205  . ondansetron (ZOFRAN) tablet 4 mg  4 mg Oral Q4H PRN Pollyann Samples, DO       Or  . ondansetron [facility]) injection 4 mg  4 mg Intravenous Q4H PRN Pollyann Samples, DO      . prenatal multivitamin tablet 1 tablet  1 tablet Oral Q1200 Pollyann Samples, DO   1 tablet at 07/24/19 1142  . senna-docusate (Senokot-S) tablet 2 tablet  2 tablet Oral Q24H Pollyann Samples, DO   2 tablet at 07/24/19 2352  . simethicone (MYLICON) chewable tablet 80 mg  80 mg Oral PRN Pollyann Samples, DO      . Tdap (BOOSTRIX) injection 0.5 mL  0.5 mL Intramuscular Once Pollyann Samples, DO        Labs:  Recent Labs  Lab 07/21/19 2221 07/22/19 1619 07/24/19 1527  WBC 6.4 5.2 8.4  HGB 11.1* 10.7* 9.8*  HCT 34.2* 32.5* 29.8*  PLT 203 201 206    Recent Labs  Lab 07/21/19 2221 07/24/19 1527  NA 133* 141  K 3.6 3.9  CL 104 108  CO2 19* 24  BUN 6 7  CREATININE 0.79 0.97  CALCIUM 8.9 8.2*  PROT 6.7 5.4*  BILITOT 0.3 0.1*  ALKPHOS 91 78  ALT 13 15  AST 16 23  GLUCOSE 73 121*     Radiology: none  Assessment & Plan:  Pt improving *Pregnancy:routine care. Formula. Declines BC *cHTN with severe pre-x: hopefully improved BP control on the three agents. Pt was poorly controlled prior to pregnancy. She has minimal LE edema and she states she is already voiding  large  amounts of urine every hour *PPx: SCDs, OOB ad lib *FEN/GI: reg diet *Dispo: potentially tomorrow. Consult meds to beds if BPs are looking good in the afternoon. Already has bp check for next week.   Durene Romans MD Attending Center for [facility])

## 2019-07-26 ENCOUNTER — Ambulatory Visit: Payer: Self-pay

## 2019-07-26 MED ORDER — NIFEDIPINE ER 60 MG PO TB24: 60.0000 mg | ORAL_TABLET | Freq: Two times a day (BID) | ORAL | 3 refills | Status: DC

## 2019-07-26 MED ORDER — HYDRALAZINE HCL 50 MG PO TABS: 50.0000 mg | ORAL_TABLET | Freq: Three times a day (TID) | ORAL | 3 refills | Status: DC

## 2019-07-26 MED ORDER — IBUPROFEN 800 MG PO TABS: 800.0000 mg | ORAL_TABLET | Freq: Three times a day (TID) | ORAL | 0 refills | Status: DC | PRN

## 2019-07-26 MED ORDER — LABETALOL HCL 200 MG PO TABS: 800.0000 mg | ORAL_TABLET | Freq: Three times a day (TID) | ORAL | 3 refills | Status: DC

## 2019-07-26 NOTE — Lactation Note (Signed)
This note was copied from a baby's chart. Lactation Consultation Note  Patient Name: Terri [name] [id number] Date: 07/26/2019 Reason for consult: Follow-up assessment;Early term 37-38.6wks;Hyperbilirubinemia  LC in to visit with P2 [name] at 74 hrs old.  Mom has been discharged, and baby was started on double phototherapy this am.    Mom resting on her side in bed with baby wrapped in bili blanket alongside Mom.    Mom given a handout on breastfeeding supplementation amounts and today, baby should be fed between 28-42 ml per feeding.  Reassured her of normal sleepiness with jaundice.  Encouraged regular pumping with DEBP to support her milk supply.  Recommended using her EBM, adding any formula as needed per volume guidelines.    Mom didn't have any questions.    Mom aware of [facility] loaner that she can have once she and baby are discharged.  Mom to call her RN if she needs assistance with latching.  RN will notify LC prn.  Interventions Interventions: Breast feeding basics reviewed;Skin to skin;Breast massage;Hand express;DEBP  Lactation Tools Discussed/Used Tools: Pump;Bottle Breast pump type: Double-Electric Breast Pump   Consult Status Consult Status: Follow-up Date: 07/27/19 Follow-up type: In-patient    [place], [place] 07/26/2019, 12:55 PM

## 2019-07-27 ENCOUNTER — Ambulatory Visit: Payer: Self-pay

## 2019-07-27 NOTE — Lactation Note (Signed)
This note was copied from a baby's chart. Lactation Consultation Note  Patient Name: Terri [name] [id number] Date: 07/27/2019 Reason for consult: Follow-up assessment;Difficult latch;Infant < 6lbs;Early term 37-38.6wks;Hyperbilirubinemia  LC in to visit with P2 Mom of 90 day old ET infant under phototherapy.  Baby born at [redacted]w[redacted]d and is down only 2% from birth weight of 6 lbs 2oz.    Baby has been getting primarily formula or EBM by bottle.  Mom's breasts are filling, some full ducts palpated.  When pumping, expressing 10-20 ml each time.  Placed warm compresses on breasts and performed some breast massage and assisted with pumping.  Changed flange size to 24 mm as areola was being pulled into flange with 27 mm.    Baby started waking up and cueing.  Offered to assist with trying to latch baby.    Mom has large, heavy breasts with erect nipples and compressible areola.  Mom has long fingernails which make it a little cumbersome supporting breast and supporting baby's head.  Baby frantic and unable to latch to breast.  Initiated a 24 mm nipple shield, showing Mom how to properly apply.  Instilled 2 ml of EBM into shield since baby was frantic and unable to settle down.  He latched and took the 2 ml but wouldn't suck when the milk wasn't right there.   Left baby latched but not sucking in football hold, warm compresses on breast.  Assisted Mom to resume pumping.  Encouraged Mom to pump for 20 mins on regular setting now.   Washed all the pump parts, nipple shield in basin and placed in separate basin for drying.  Mom to ice for 20 mins between pumping, and massage with warm compresses prior to pumping.   Mom to pump more frequently, every 2-3 hrs.  Mom to call for latch assist prn.   Feeding Feeding Type: Breast Milk Nipple Type: Slow - flow  LATCH Score Latch: Repeated attempts needed to sustain latch, nipple held in mouth throughout feeding, stimulation needed to elicit sucking  reflex.  Audible Swallowing: None  Type of Nipple: Everted at rest and after stimulation  Comfort (Breast/Nipple): Filling, red/small blisters or bruises, mild/mod discomfort  Hold (Positioning): Assistance needed to correctly position infant at breast and maintain latch.  LATCH Score: 5  Interventions Interventions: Breast feeding basics reviewed;Assisted with latch;Skin to skin;Breast massage;Hand express;Breast compression;Adjust position;Support pillows;Position options;Expressed milk;DEBP  Lactation Tools Discussed/Used Tools: Pump;Flanges;Nipple [place] Fuel;Bottle Nipple shield size: 24 Flange Size: 24 Breast pump type: Double-Electric Breast Pump   Consult Status Consult Status: Follow-up Date: 07/28/19 Follow-up type: In-patient    Gracy, [place] 07/27/2019, 3:14 PM

## 2019-07-28 ENCOUNTER — Ambulatory Visit: Payer: Self-pay

## 2019-07-28 NOTE — Lactation Note (Signed)
This note was copied from a baby's chart. Lactation Consultation Note  Patient Name: Terri [name] [id number] Date: 07/28/2019 Reason for consult: Follow-up assessment Baby is 30 days old/1% weight loss.  Breasts are very full but not engorged.  Mom reports pumping every 3 hours.  She is obtaining 20 mls from right and 10 mls from left.  She has used heat and massage prior to and during pumping.  Mom and baby will be discharged today.  Instructed to attempt pumping every 2 hours today.  Baby is receiving formula by bottle.  Mom states baby won't latch even with nipple shield.  Instructed to continue putting baby to breast and if no improvement with latching in the next few days call for an outpatient appointment.  [facility] loaner completed.  Encouraged to call for concerns prn.  Maternal Data    Feeding Feeding Type: Bottle Fed - Formula Nipple Type: Slow - flow  LATCH Score                   Interventions    Lactation Tools Discussed/Used     Consult Status Consult Status: Complete Follow-up type: Call as needed    Ave Filter 07/28/2019, 11:18 AM

## 2019-07-30 IMAGING — US OBSTETRIC <14 WK US AND TRANSVAGINAL OB US
1 series · 15 of 28 positions shown · non-contrast
Comparison: None.

CLINICAL DATA: Bleeding

EXAM:
OBSTETRIC <14 WK US AND TRANSVAGINAL OB US
TECHNIQUE: Both transabdominal and transvaginal ultrasound examinations were
performed for complete evaluation of the gestation as well as the
maternal uterus, adnexal regions, and pelvic cul-de-sac.
Transvaginal technique was performed to assess early pregnancy.

[Series 1: obstetric <14 wk us and transvaginal ob us · 15 of 66 slices shown]
[im 1/66]
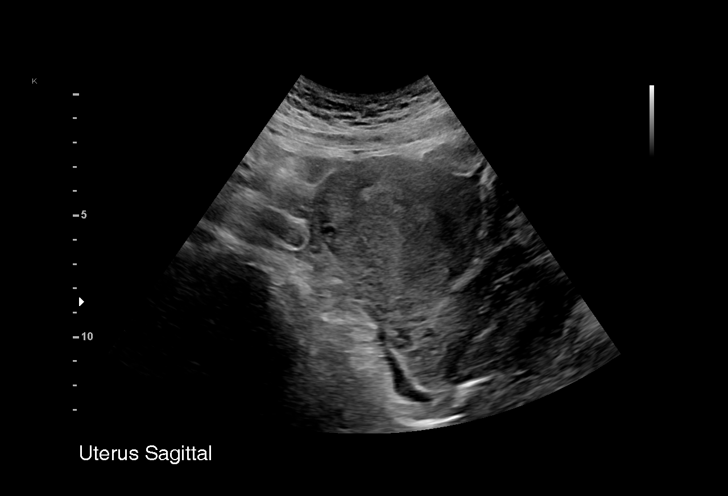
[im 5/66]
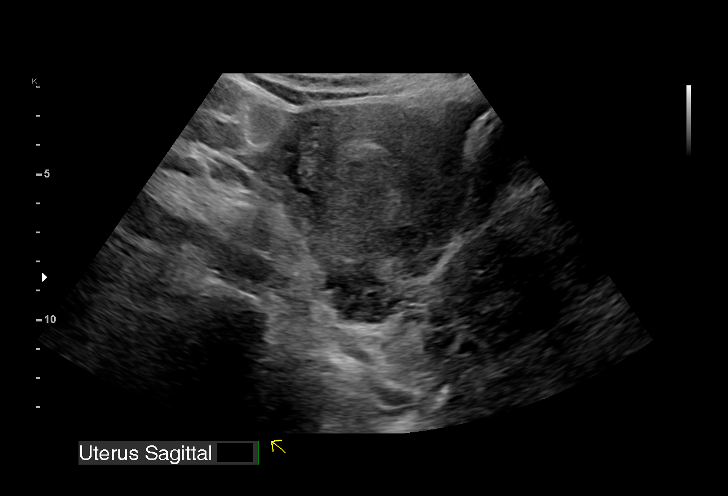
[im 10/66]
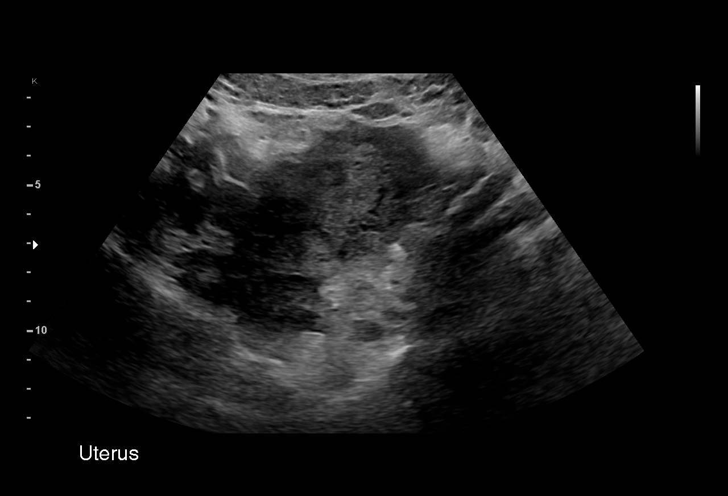
[im 15/66]
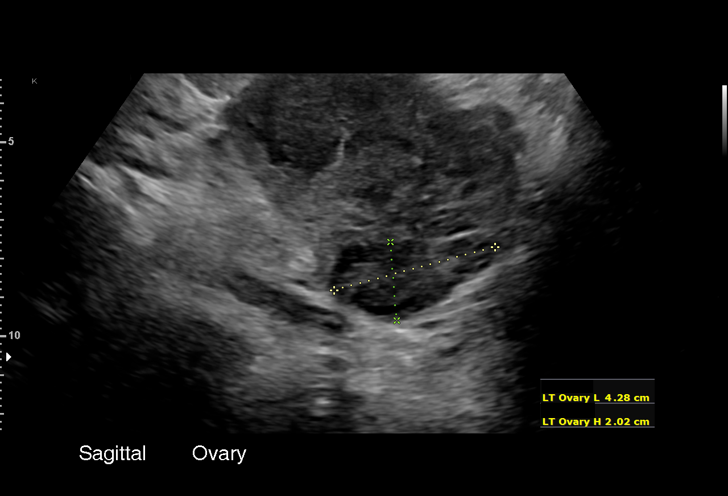
[im 20/66]
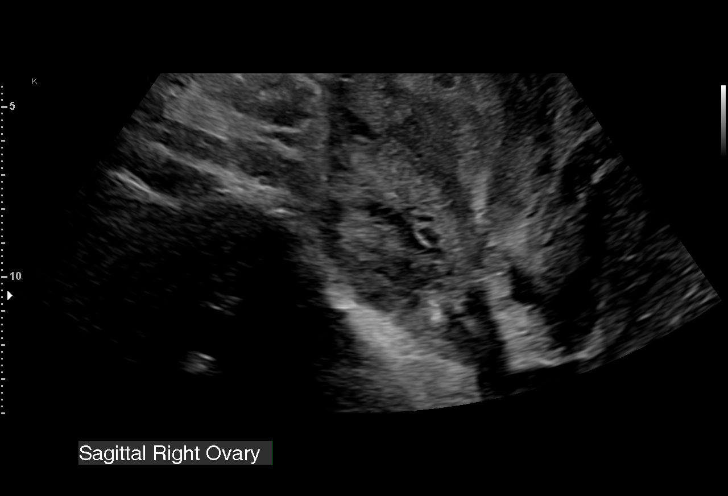
[im 25/66]
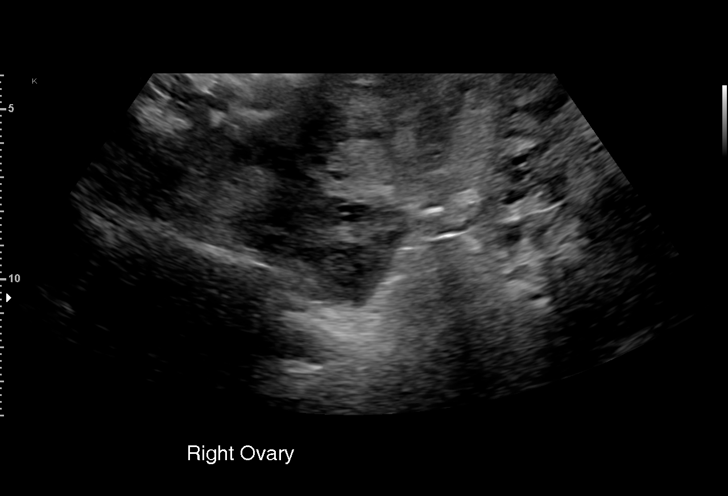
[im 29/66]
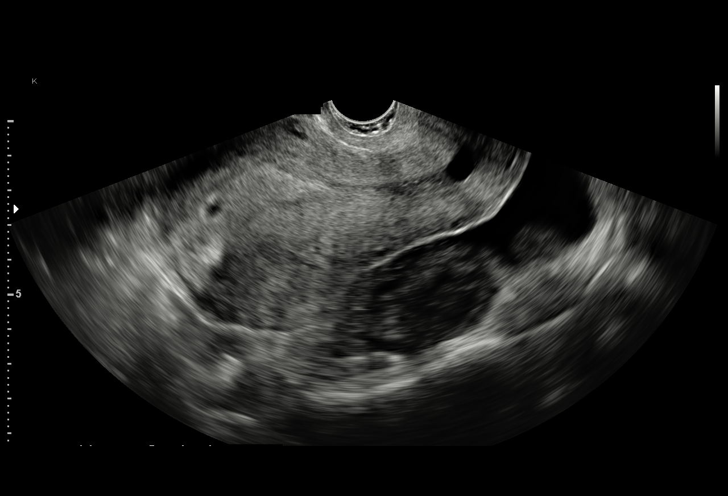
[im 34/66]
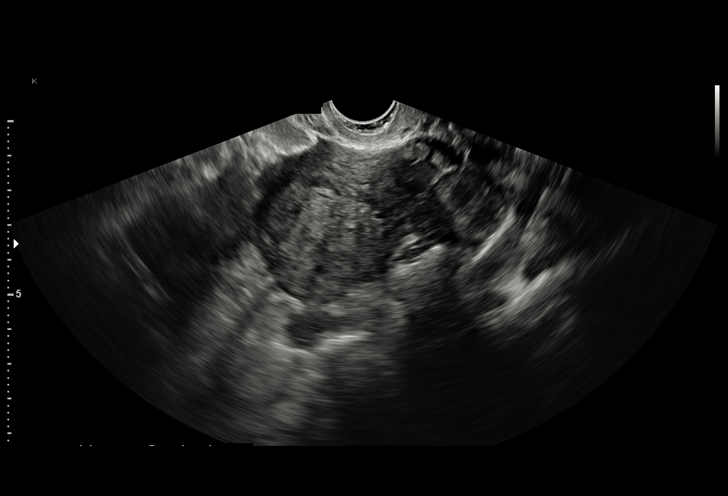
[im 37/66]
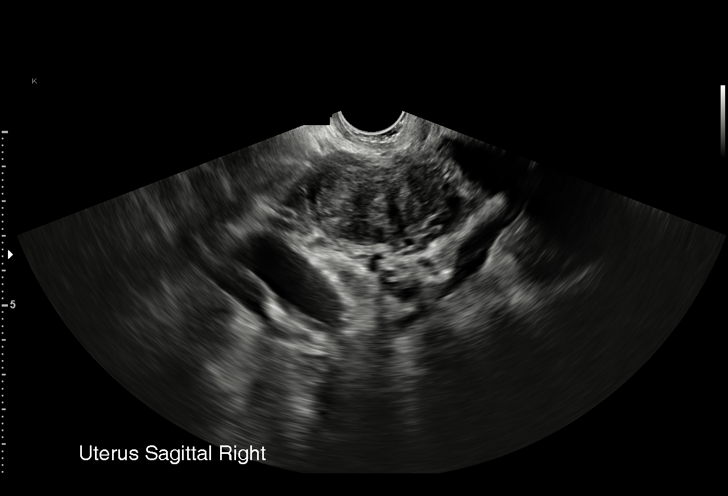
[im 41/66]
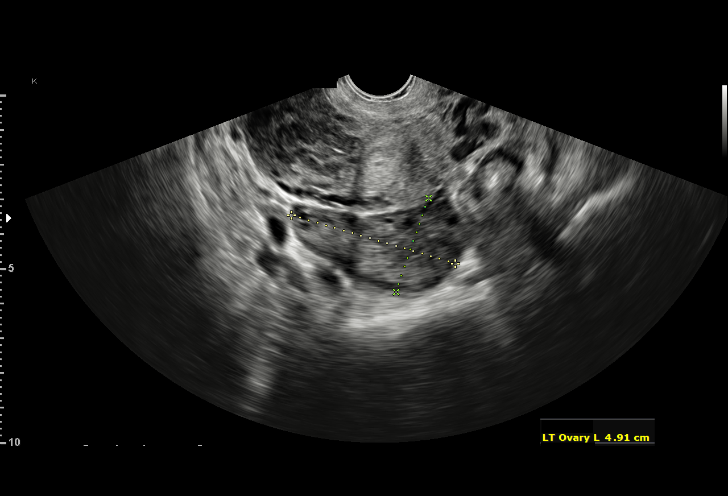
[im 46/66]
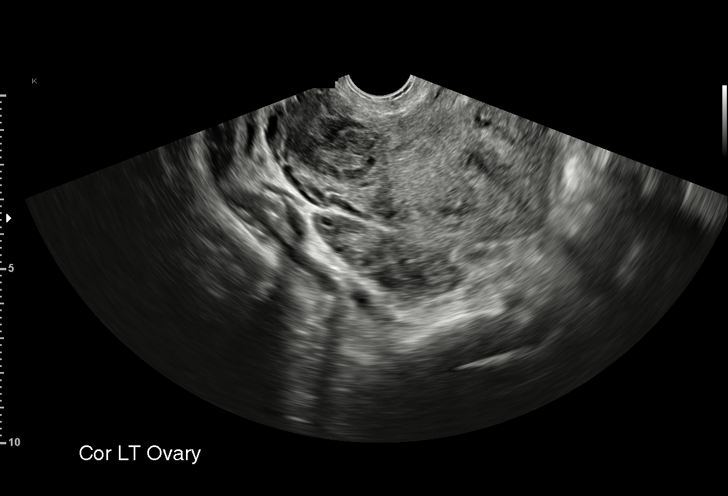
[im 51/66]
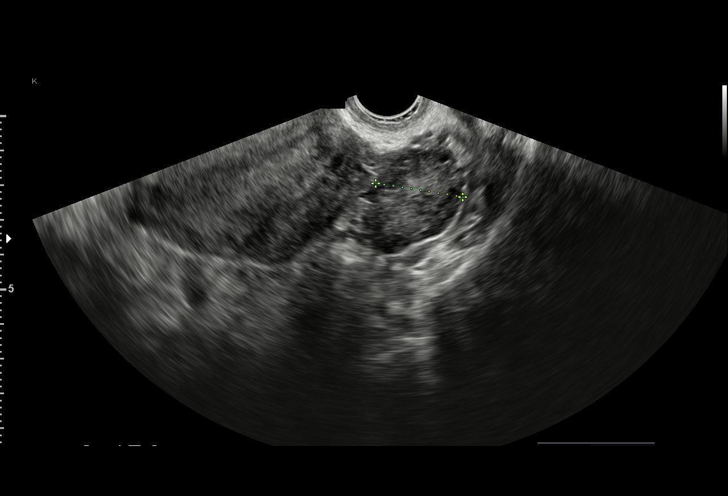
[im 56/66]
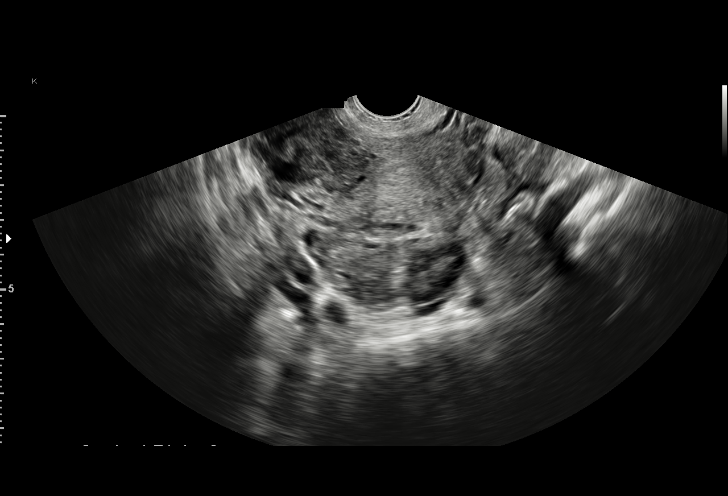
[im 61/66]
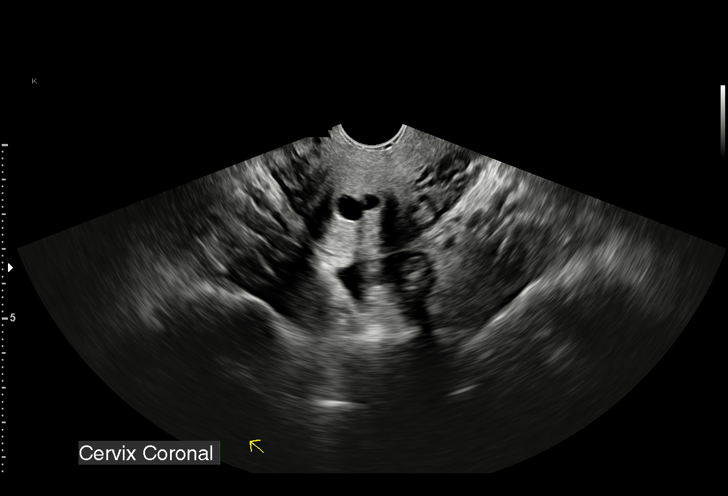
[im 66/66]
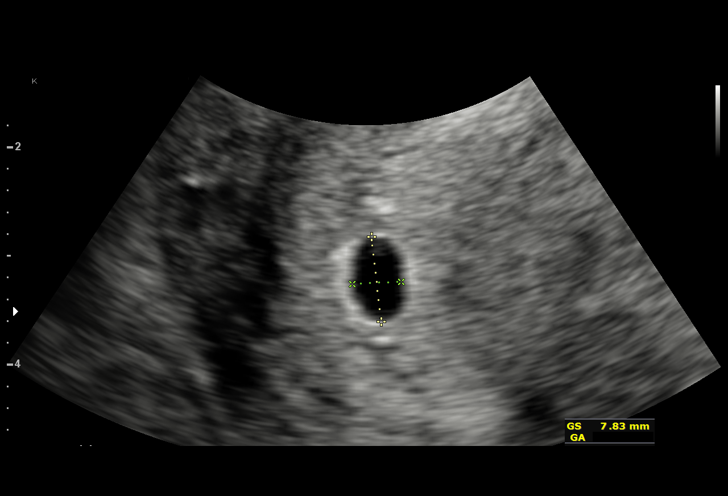

[15 of 28 positions shown; findings below may reference images not displayed]

FINDINGS: Intrauterine gestational sac: Single

Yolk sac:  Visualized.

Embryo:  Not Visualized.

Cardiac Activity: Not Visualized.

MSD: 5.7 mm   5 w   2 d

Subchorionic hemorrhage:  None visualized.

Maternal uterus/adnexae: The ovaries are unremarkable. Multiple
fibroids are noted measuring up to approximately 3.4 cm. There is a
small amount of free fluid.
IMPRESSION: Probable early intrauterine gestational sac, but no yolk sac, fetal
pole, or cardiac activity yet visualized. Recommend follow-up
quantitative B-HCG levels and follow-up US in 14 days to assess
viability. This recommendation follows SRU consensus guidelines:
Diagnostic Criteria for Nonviable Pregnancy Early in the First
Trimester. N Engl J Med 9660; [DATE].

Multiple fibroids are noted.

## 2019-08-07 ENCOUNTER — Telehealth: Payer: Self-pay

## 2019-08-07 ENCOUNTER — Other Ambulatory Visit: Payer: Self-pay

## 2019-08-07 NOTE — Progress Notes (Signed)
error 

## 2019-08-07 NOTE — Telephone Encounter (Signed)
Patient's home health nurse called and reports that she scored a 26 on EPDS today. Nurse reports she referred the patient to [facility] and wanted to let us know. Called patient to check in with her and make sure she knows about the referral. Phone went straight to voicemail, LVM for patient to call back.

## 2019-08-11 ENCOUNTER — Ambulatory Visit: Payer: Medicaid Other | Admitting: Obstetrics and Gynecology

## 2019-08-11 ENCOUNTER — Inpatient Hospital Stay (HOSPITAL_COMMUNITY): Admit: 2019-08-11 | Payer: Self-pay

## 2019-08-20 ENCOUNTER — Telehealth (INDEPENDENT_AMBULATORY_CARE_PROVIDER_SITE_OTHER): Payer: Medicaid Other | Admitting: Obstetrics and Gynecology

## 2019-08-20 ENCOUNTER — Encounter: Payer: Self-pay | Admitting: Obstetrics and Gynecology

## 2019-08-20 VITALS — BP 165/108

## 2019-08-20 DIAGNOSIS — I10 Essential (primary) hypertension: Secondary | ICD-10-CM

## 2019-08-20 DIAGNOSIS — O99345 Other mental disorders complicating the puerperium: Secondary | ICD-10-CM

## 2019-08-20 DIAGNOSIS — O09899 Supervision of other high risk pregnancies, unspecified trimester: Secondary | ICD-10-CM

## 2019-08-20 DIAGNOSIS — F53 Postpartum depression: Secondary | ICD-10-CM

## 2019-08-20 NOTE — Progress Notes (Signed)
Post Partum Exam  I connected with Terri [name] on 08/20/19 at  2:00 PM EST by MyChart Video Encounter at home and verified that I am speaking with the correct person using two identifiers.    I discussed the limitations, risks, security and privacy concerns of performing an evaluation and management service virtually and the availability of in person appointments. I also discussed with the patient that there may be a patient responsible charge related to this service. The patient expressed understanding and agreed to proceed.    Terri [name] is a 34 y.o. [id number] female who presents for a postpartum visit. She is 4 weeks postpartum following a spontaneous vaginal delivery. IOL at 37 weeks d/t CHTN with [place].  I have fully reviewed the prenatal and intrapartum course. The delivery was at 16 gestational weeks.  Anesthesia: none. Postpartum course has been unremarkable except for BP control and postpartum depression. Denies any suicidal thoughts or plans. Has been referred to behavioral health but did not make an appt. . Baby's course has been unremarkable. Baby is feeding by both breast and bottle - [place] Good Start. Bleeding no bleeding. Bowel function is normal. Bladder function is normal. Patient is sexually active. Contraception method is none. Postpartum depression screening: positive (score: 19)  The following portions of the patient's history were reviewed and updated as appropriate: allergies, current medications, past family history, past medical history, past social history, past surgical history and problem list. Last pap smear done 08/13.2020 and was Normal  Review of Systems Pertinent items noted in HPI and remainder of comprehensive ROS otherwise negative.    Objective:  Last menstrual period 11/04/2018, unknown if currently breastfeeding.       Assessment:    NL postpartum exam. Pap smear not done at today's visit.   CHTN, uncontrolled.   Postpartum depression  Plan:    1. Contraception: undecided, advised to use condoms 2. A new referral to behavior health made again today.  Will continue with current BP medications. Continue to monitor BP at home. Will have pt come to office in 2 weeks for BP check. Will also give pt list of PCP's to continue BP management 3. Follow up in 2 weeks for BP check

## 2019-09-10 ENCOUNTER — Other Ambulatory Visit: Payer: Self-pay

## 2019-09-10 ENCOUNTER — Ambulatory Visit (INDEPENDENT_AMBULATORY_CARE_PROVIDER_SITE_OTHER): Payer: Medicaid Other

## 2019-09-10 VITALS — BP 140/88 | HR 86 | Temp 99.0°F | Wt 244.8 lb

## 2019-09-10 DIAGNOSIS — R300 Dysuria: Secondary | ICD-10-CM | POA: Diagnosis not present

## 2019-09-10 LAB — POCT URINALYSIS DIPSTICK
Bilirubin, UA: NEGATIVE
Blood, UA: POSITIVE
Glucose, UA: NEGATIVE
Ketones, UA: POSITIVE
Nitrite, UA: NEGATIVE
Protein, UA: NEGATIVE
Spec Grav, UA: 1.01 (ref 1.010–1.025)
Urobilinogen, UA: 0.2 E.U./dL
pH, UA: 7 (ref 5.0–8.0)

## 2019-09-10 MED ORDER — NITROFURANTOIN MONOHYD MACRO 100 MG PO CAPS: 100.0000 mg | ORAL_CAPSULE | Freq: Two times a day (BID) | ORAL | 0 refills | Status: AC

## 2019-09-10 MED ORDER — PHENAZOPYRIDINE HCL 200 MG PO TABS: 200.0000 mg | ORAL_TABLET | Freq: Three times a day (TID) | ORAL | 0 refills | Status: DC | PRN

## 2019-09-10 NOTE — Progress Notes (Signed)
Pt is here with c/o UTI symptoms. Pt reports dysuria and lower back pain for 2 days. POCT urinalysis shows signs of UTI. Advised pt I will send antibiotic and pyridium to her pharmacy for her to pick up and start taking. Pt reports she is not breastfeeding. Advised pt we will send urine off for culture and let her know once results are in. Advised pt to go to ER if symptoms worsen. Pt voices understanding. Also advised pt to find PCP to follow up about blood pressure medication, she reports she is still taking labetalol as prescribed by [place]. Pt denies any HA's, blurry vision, or sharp abdominal pain. Pt reports she is working on getting an appt with PCP.

## 2019-09-12 ENCOUNTER — Encounter: Payer: Self-pay | Admitting: Emergency Medicine

## 2019-09-12 ENCOUNTER — Other Ambulatory Visit: Payer: Self-pay

## 2019-09-12 ENCOUNTER — Ambulatory Visit
Admission: EM | Admit: 2019-09-12 | Discharge: 2019-09-12 | Disposition: A | Discharge status: Home / Self Care | Payer: Medicaid Other | Attending: Physician Assistant | Admitting: Physician Assistant

## 2019-09-12 DIAGNOSIS — R000 Tachycardia, unspecified: Secondary | ICD-10-CM

## 2019-09-12 DIAGNOSIS — N10 Acute tubulo-interstitial nephritis: Secondary | ICD-10-CM

## 2019-09-12 DIAGNOSIS — I10 Essential (primary) hypertension: Secondary | ICD-10-CM | POA: Diagnosis not present

## 2019-09-12 MED ORDER — ACETAMINOPHEN 325 MG PO TABS
975.0000 mg | ORAL_TABLET | Freq: Once | ORAL | Status: AC
  Administered 2019-09-12: 20:00:00 975 mg via ORAL

## 2019-09-12 MED ORDER — CEFTRIAXONE SODIUM 1 G IJ SOLR
1.0000 g | Freq: Once | INTRAMUSCULAR | Status: AC
  Administered 2019-09-12: 20:00:00 1 g via INTRAMUSCULAR

## 2019-09-12 MED ORDER — ONDANSETRON 4 MG PO TBDP: 4.0000 mg | ORAL_TABLET | Freq: Three times a day (TID) | ORAL | 0 refills | Status: DC | PRN

## 2019-09-12 MED ORDER — CEPHALEXIN 500 MG PO CAPS: 500.0000 mg | ORAL_CAPSULE | Freq: Two times a day (BID) | ORAL | 0 refills | Status: AC

## 2019-09-12 MED ORDER — KETOROLAC TROMETHAMINE 15 MG/ML IJ SOLN
15.0000 mg | Freq: Once | INTRAMUSCULAR | Status: AC
  Administered 2019-09-12: 20:00:00 15 mg via INTRAMUSCULAR

## 2019-09-12 NOTE — ED Provider Notes (Signed)
[facility]    CSN: [id number] Arrival date & time: 09/12/19  1841      History   Chief Complaint Chief Complaint  Patient presents with  . Fever    HPI Terri [name] is a 34 y.o. female.   34 year old female comes in for fever after being diagnosed with UTI. States was seen by OBGYN 2 days for urinary symptoms. Dipstick tested positive for UTI and started on macrobid.  She was able to take 2 doses of Macrobid without problems.  Since then, she feels nauseous after taking medication, and will vomit back up.  However, able to eat and drink without nausea or vomiting.  Denies any allergic reaction symptoms including itching, hives, oral swelling, shortness of breath.  States when she was seen, T-max 99, and temperature has now increased to T-max 101.7.  States right-sided back pain has worsened.  Current urine orange due to Pyridium, however prior to this, denies any hematuria.  Does have history of kidney stones.  Denies abdominal pain.  Denies URI symptoms such as cough, congestion, sore throat.  Not currently breast-feeding.     Past Medical History:  Diagnosis Date  . Asthma   . Chronic hypertension with superimposed severe preeclampsia 01/31/2019  . Hypertension   . Hypertention, malignant, with acute intensive management   . Supervision of other high risk pregnancy, antepartum 01/07/2019   .[name] Nursing Staff Provider Office Location  [facility] Dating  LMP Language  English Anatomy US  Incomplete but wnl, f/u wnl Flu Vaccine  Declined Genetic Screen  NIPS:   AFP:   First Screen:  Quad:   TDaP vaccine    Hgb A1C or  GTT Early  Third trimester nl 2 hour Rhogam     LAB RESULTS  Feeding Plan Breast & Bottle Blood Type O/Positive/-- (08/13 1132)  Contraception Declined Antibody Negative (08/13    Patient Active Problem List   Diagnosis Date Noted  . Postpartum care following vaginal delivery 08/20/2019  . Postpartum depression 08/20/2019  . Migraine 11/12/2014  . Mixed  incontinence 10/03/2013  . Nephrolithiasis 06/18/2013  . Unspecified episodic mood disorder 04/05/2011  . Poorly-controlled hypertension 03/21/2011  . TOBACCO USER 06/14/2009  . Obesity in pregnancy, antepartum 01/05/2009  . ASTHMA, INTERMITTENT 08/09/2006    History reviewed. No pertinent surgical history.  OB History    Gravida  2   Para  2   Term  2   Preterm      AB      Living  2     SAB      TAB      Ectopic      Multiple  0   Live Births  2            Home Medications    Prior to Admission medications   Medication Sig Start Date End Date Taking? Authorizing Provider  labetalol (NORMODYNE) 200 MG tablet Take 4 tablets (800 mg total) by mouth 3 (three) times daily. 07/26/19  Yes Dove, Myra C, MD  nitrofurantoin, macrocrystal-monohydrate, (MACROBID) 100 MG capsule Take 1 capsule (100 mg total) by mouth 2 (two) times daily for 5 days. 09/10/19 09/15/19 Yes Shelly Bombard, MD  phenazopyridine (PYRIDIUM) 200 MG tablet Take 1 tablet (200 mg total) by mouth 3 (three) times daily as needed for pain (urethral spasm). 09/10/19  Yes Shelly Bombard, MD  Blood Pressure Monitoring KIT 1 kit by Does not apply route once a week. 01/07/19  Harper, Charles A, MD  cephALEXin (KEFLEX) 500 MG capsule Take 1 capsule (500 mg total) by mouth 2 (two) times daily for 10 days. 09/12/19 09/22/19  ,  V, PA-C  ondansetron (ZOFRAN ODT) 4 MG disintegrating tablet Take 1 tablet (4 mg total) by mouth every 8 (eight) hours as needed for nausea or vomiting. 09/12/19   ,  V, PA-C  hydrALAZINE (APRESOLINE) 50 MG tablet Take 1 tablet (50 mg total) by mouth every 8 (eight) hours. Patient not taking: Reported on 09/10/2019 07/26/19 09/12/19  Dove, Myra C, MD  hydrochlorothiazide (HYDRODIURIL) 25 MG tablet Take 1 tablet (25 mg total) by mouth daily. 10/14/18 12/06/18  Sofia, Leslie K, PA-C  NIFEdipine (ADALAT CC) 60 MG 24 hr tablet Take 1 tablet (60 mg total) by mouth 2 (two) times daily. Patient  not taking: Reported on 09/10/2019 07/26/19 09/12/19  Dove, Myra C, MD    Family History Family History  Problem Relation Age of Onset  . Hypertension Mother   . Cancer Maternal Grandfather   . Cancer Paternal Grandfather     Social History Social History   Tobacco Use  . Smoking status: Former Smoker    Packs/day: 0.20    Types: Cigarettes    Quit date: 11/24/2018    Years since quitting: 0.8  . Smokeless tobacco: Former User    Types: Snuff  Substance Use Topics  . Alcohol use: Not Currently    Comment: 11/2018  . Drug use: Not Currently    Types: Marijuana    Comment: 12/11/2018; previously used     Allergies   Vicodin [hydrocodone-acetaminophen]   Review of Systems Review of Systems  Reason unable to perform ROS: See HPI as above.     Physical Exam Triage Vital Signs ED Triage Vitals  Enc Vitals Group     BP 09/12/19 1849 (!) 179/107     Pulse Rate 09/12/19 1849 (!) 104     Resp 09/12/19 1849 20     Temp 09/12/19 1849 (!) 101.4 F (38.6 C)     Temp Source 09/12/19 1849 Oral     SpO2 09/12/19 1849 97 %     Weight 09/12/19 1903 244 lb (110.7 kg)     Height 09/12/19 1903 5' 8" (1.727 m)     Head Circumference --      Peak Flow --      Pain Score 09/12/19 1903 10     Pain Loc --      Pain Edu? --      Excl. in [place]? --    No data found.  Updated Vital Signs BP (!) 179/107 (BP Location: Right Arm)   Pulse (!) 104   Temp (!) 101.4 F (38.6 C) (Oral)   Resp 20   Ht 5' 8" (1.727 m)   Wt 244 lb (110.7 kg)   LMP 11/04/2018   SpO2 97%   BMI 37.10 kg/m   Visual Acuity Right Eye Distance:   Left Eye Distance:   Bilateral Distance:    Right Eye Near:   Left Eye Near:    Bilateral Near:     Physical Exam Constitutional:      General: She is not in acute distress.    Appearance: She is well-developed. She is not ill-appearing, toxic-appearing or diaphoretic.  HENT:     Head: Normocephalic and atraumatic.  Eyes:     Conjunctiva/sclera: Conjunctivae  normal.     Pupils: Pupils are equal, round, and reactive to light.  Cardiovascular:       Rate and Rhythm: Regular rhythm. Tachycardia present.  Pulmonary:     Effort: Pulmonary effort is normal. No respiratory distress.     Comments: LCTAB Abdominal:     General: Bowel sounds are normal.     Palpations: Abdomen is soft.     Tenderness: There is no abdominal tenderness. There is no right CVA tenderness, left CVA tenderness, guarding or rebound.  Musculoskeletal:     Cervical back: Normal range of motion and neck supple.  Skin:    General: Skin is warm and dry.  Neurological:     Mental Status: She is alert and oriented to person, place, and time.  Psychiatric:        Behavior: Behavior normal.        Judgment: Judgment normal.      UC Treatments / Results  Labs (all labs ordered are listed, but only abnormal results are displayed) Labs Reviewed - No data to display  EKG   Radiology No results found.  Procedures Procedures (including critical care time)  Medications Ordered in UC Medications  cefTRIAXone (ROCEPHIN) injection 1 g (has no administration in time range)  acetaminophen (TYLENOL) tablet 975 mg (has no administration in time range)  ketorolac (TORADOL) 15 MG/ML injection 15 mg (has no administration in time range)    Initial Impression / Assessment and Plan / UC Course  I have reviewed the triage vital signs and the nursing notes.  Pertinent labs & imaging results that were available during my care of the patient were reviewed by me and considered in my medical decision making (see chart for details).    Urine culture is not available.  Current history and exam consistent with pyelonephritis.  Will provide Rocephin injection in office today.  Toradol for symptomatic management.  Will have patient discontinue Macrobid, and switch to Keflex.  Will have patient use Zofran as needed push fluids.  Strict return precautions given.  Patient expresses understanding  and agrees to plan.  Final Clinical Impressions(s) / UC Diagnoses   Final diagnoses:  Acute pyelonephritis   ED Prescriptions    Medication Sig Dispense Auth. Provider   cephALEXin (KEFLEX) 500 MG capsule Take 1 capsule (500 mg total) by mouth 2 (two) times daily for 10 days. 20 capsule Almendra Loria V, PA-C   ondansetron (ZOFRAN ODT) 4 MG disintegrating tablet Take 1 tablet (4 mg total) by mouth every 8 (eight) hours as needed for nausea or vomiting. 20 tablet Ok Edwards, PA-C     PDMP not reviewed this encounter.   Ok Edwards, PA-C 09/12/19 1935

## 2019-09-12 NOTE — Discharge Instructions (Signed)
Discontinue Macrobid (nitrofurantoin).  Rocephin injection in office today.  Toradol injection for pain today.  Tylenol for fever.  Start Keflex as directed.  Zofran as needed for nausea/vomiting. Keep hydrated, urine should be clear to pale yellow in color.  If worsening symptoms, continued high fever despite Tylenol/Motrin, increased abdominal/flank pain, unable to urinate, weakness, dizziness, go to the emergency department for further evaluation.  You can alternate ibuprofen 800 mg every 8 hours, Tylenol 1000 mg every 8 hours for fever and pain.

## 2019-09-12 NOTE — ED Triage Notes (Signed)
Patient c/o fever that started 2 days ago. She states she was diagnosed with a UTI 2 days ago and was given Nitrofurantoin twice daily. She stated she started this medication 2 days ago. She has not been able to keep the medication down since yesterday due to vomiting.

## 2019-09-15 LAB — URINE CULTURE

## 2019-09-16 ENCOUNTER — Other Ambulatory Visit: Payer: Self-pay | Admitting: Obstetrics

## 2019-09-20 ENCOUNTER — Ambulatory Visit
Admission: EM | Admit: 2019-09-20 | Discharge: 2019-09-20 | Disposition: A | Discharge status: Home / Self Care | Payer: Medicaid Other

## 2019-09-20 ENCOUNTER — Other Ambulatory Visit: Payer: Self-pay

## 2019-09-20 ENCOUNTER — Encounter: Payer: Self-pay | Admitting: Emergency Medicine

## 2019-09-20 DIAGNOSIS — S0512XA Contusion of eyeball and orbital tissues, left eye, initial encounter: Secondary | ICD-10-CM
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
## 2019-09-20 NOTE — Discharge Instructions (Addendum)
Recommend RICE: rest, ice, compression, elevation as needed for pain.   Cold therapy (ice packs) can be used to help swelling both after injury and after prolonged use of areas of chronic pain/aches.  For pain: recommend 350 mg-1000 mg of Tylenol (acetaminophen) and/or 200 mg - 800 mg of Advil (ibuprofen, Motrin) every 8 hours as needed.  May alternate between the two throughout the day as they are generally safe to take together.  DO NOT exceed more than 3000 mg of Tylenol or 3200 mg of ibuprofen in a 24 hour period as this could damage your stomach, kidneys, liver, or increase your bleeding risk.  Go to ER for worsening pain, swelling, bruising, difficulty seeing or moving eyeball, severe cheek pain or headaches.
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
## 2020-01-15 ENCOUNTER — Ambulatory Visit (INDEPENDENT_AMBULATORY_CARE_PROVIDER_SITE_OTHER): Payer: Medicaid Other | Admitting: Family Medicine

## 2020-01-15 DIAGNOSIS — Z5329 Procedure and treatment not carried out because of patient's decision for other reasons: Secondary | ICD-10-CM

## 2020-01-15 NOTE — Progress Notes (Deleted)
No Show

## 2020-01-16 NOTE — Progress Notes (Signed)
No show

## 2020-02-04 ENCOUNTER — Ambulatory Visit (HOSPITAL_COMMUNITY)
Admission: EM | Admit: 2020-02-04 | Discharge: 2020-02-04 | Disposition: A | Discharge status: Home / Self Care | Payer: Medicaid Other | Attending: Internal Medicine | Admitting: Internal Medicine

## 2020-02-04 ENCOUNTER — Other Ambulatory Visit: Payer: Self-pay

## 2020-02-04 DIAGNOSIS — U071 COVID-19: Secondary | ICD-10-CM | POA: Diagnosis not present

## 2020-02-04 DIAGNOSIS — Z1152 Encounter for screening for COVID-19: Secondary | ICD-10-CM

## 2020-02-04 DIAGNOSIS — Z20822 Contact with and (suspected) exposure to covid-19: Secondary | ICD-10-CM | POA: Diagnosis present

## 2020-02-04 LAB — SARS CORONAVIRUS 2 (TAT 6-24 HRS): SARS Coronavirus 2: POSITIVE — AB

## 2020-02-04 NOTE — Discharge Instructions (Signed)
If your Covid-19 test is positive, you will get a phone call from [facility] regarding your results. If your Covid-19 test is negative, you will NOT get a phone call from [facility] with your results. You may view your results on MyChart. If you do not have a MyChart account, sign up instructions are in your discharge papers. ° °

## 2020-02-04 NOTE — ED Triage Notes (Signed)
Pt presents for covid testing after losing sense of smell and taste.

## 2020-02-20 IMAGING — US US MFM FETAL BPP W/O NON-STRESS
1 series · 11 of 11 positions shown · non-contrast
Comparison: none

[Series 1: us mfm fetal bpp w/o non-stress · 11 acquisitions, 11 frames shown]
[im 1/11]
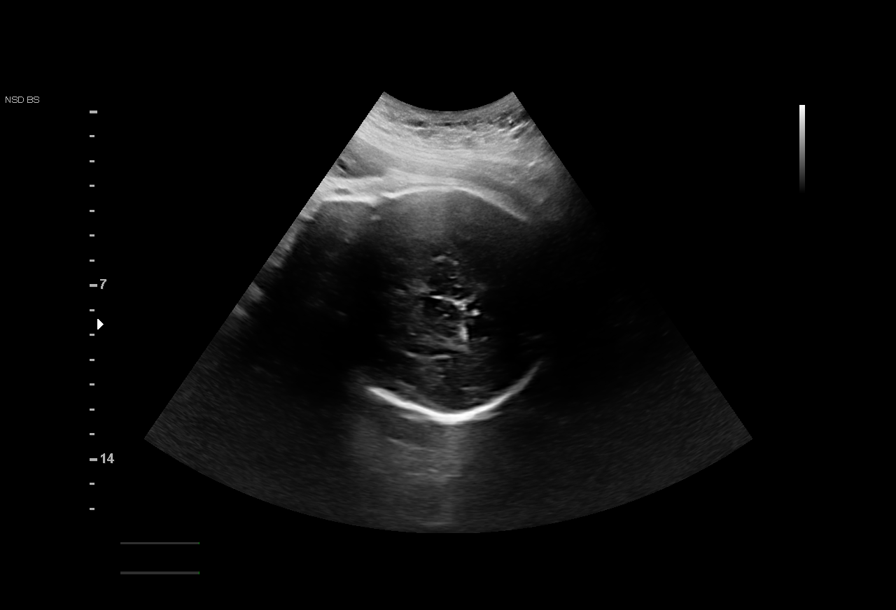
[im 2/11]
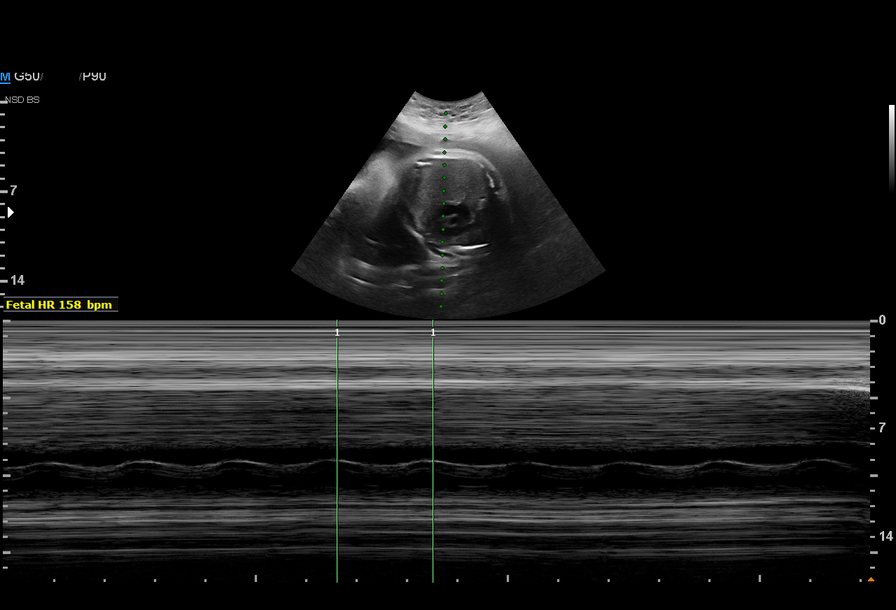
[im 3/11]
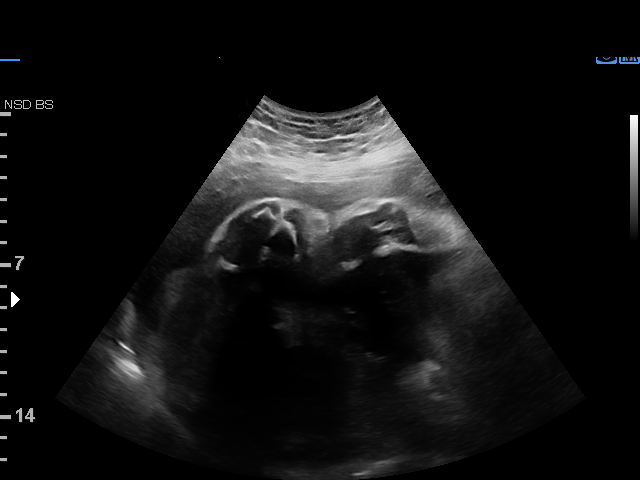
[im 4/11]
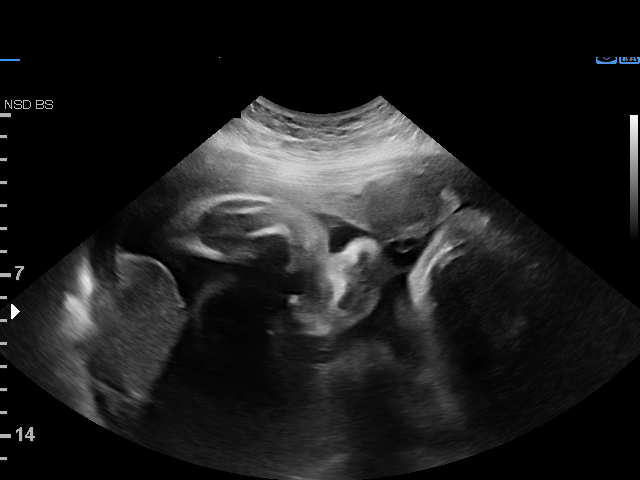
[im 5/11]
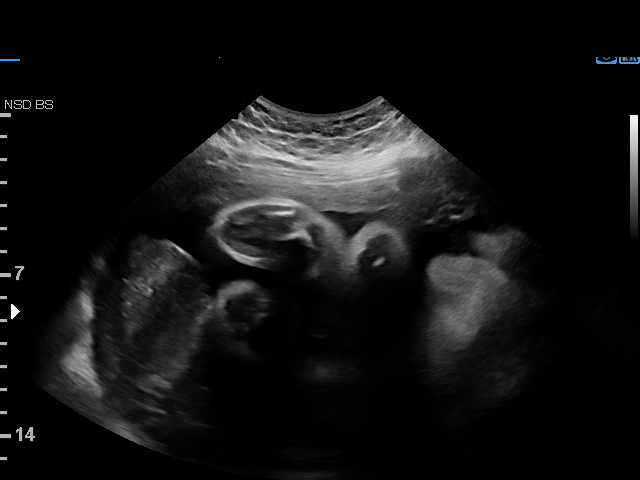
[im 6/11]
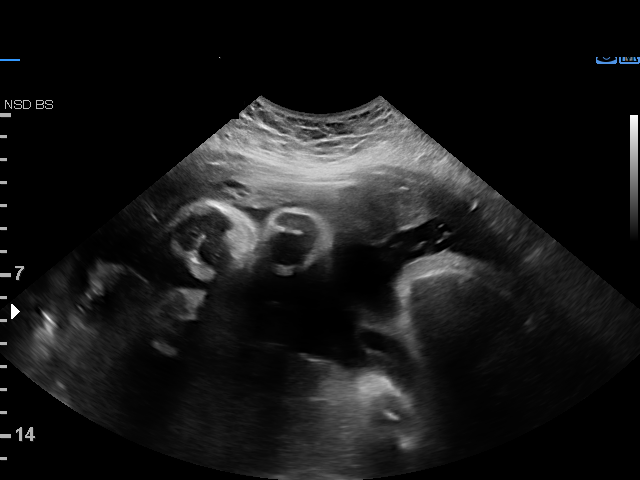
[im 7/11]
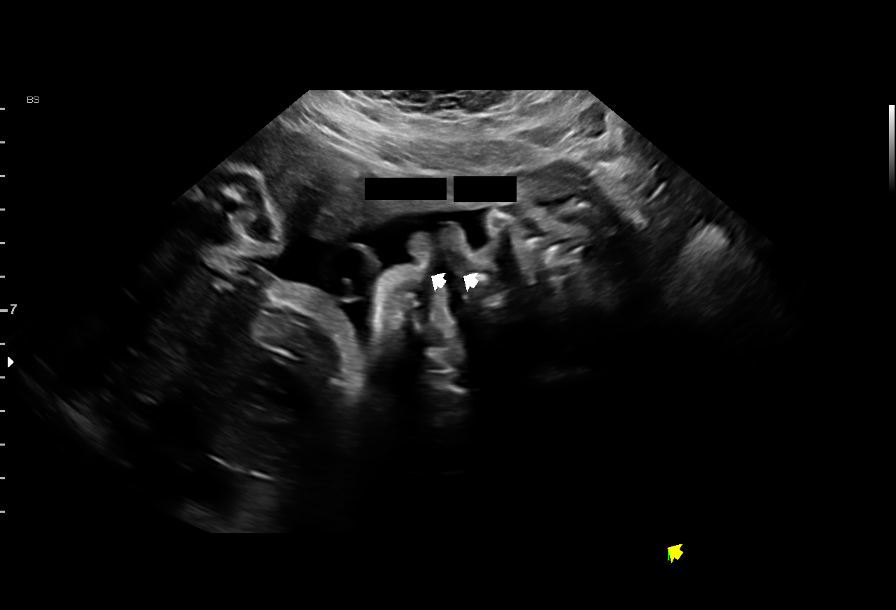
[im 8/11]
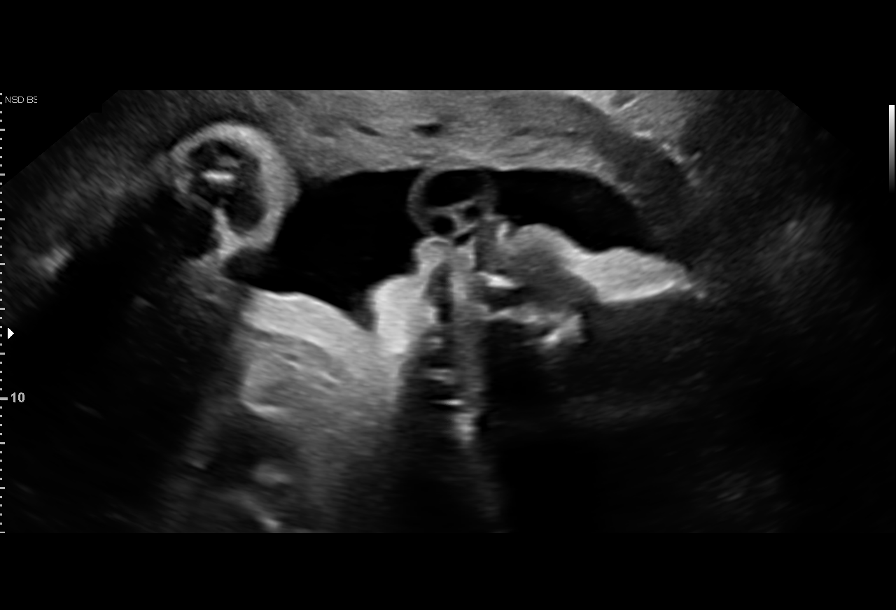
[im 9/11]
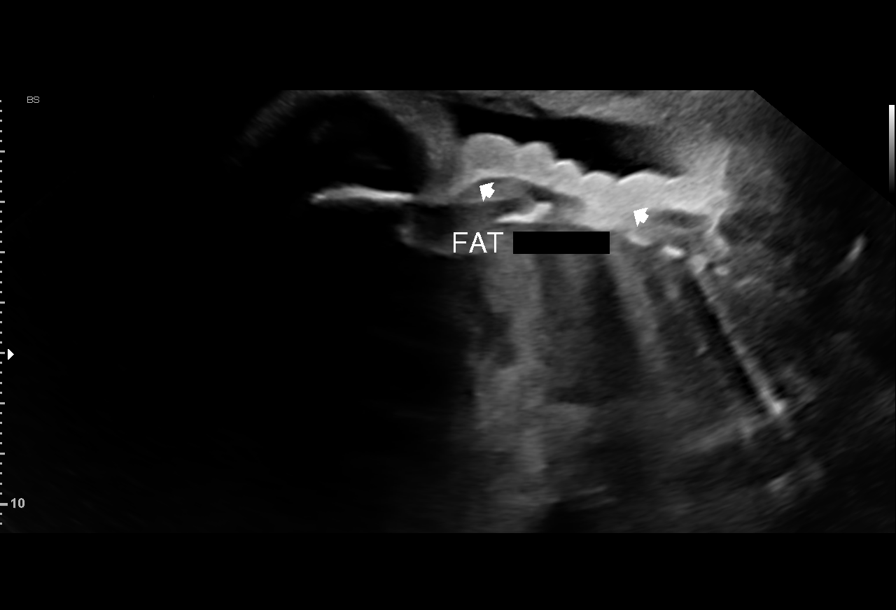
[im 10/11]
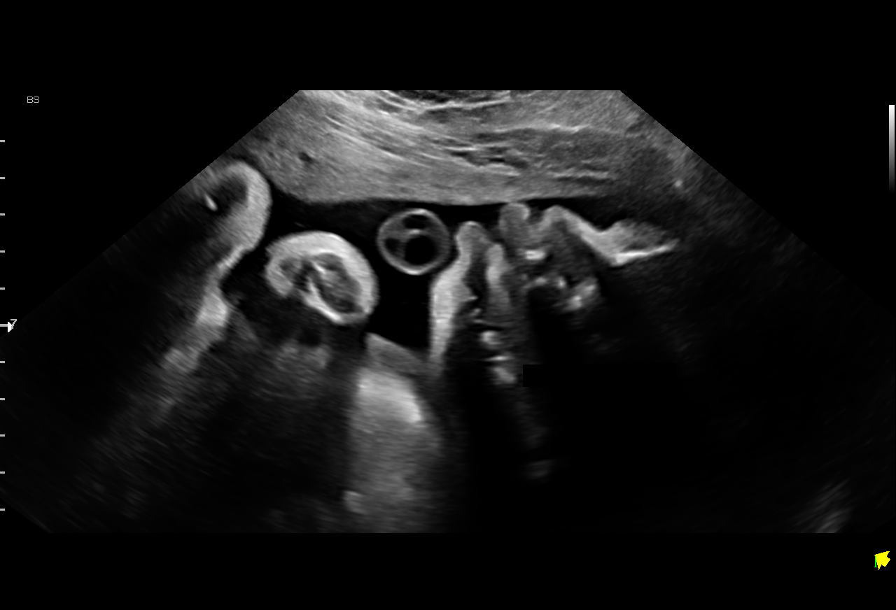
[im 11/11]
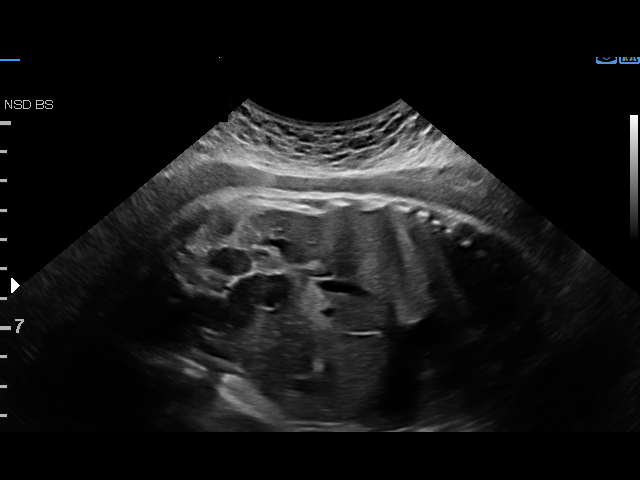

[11 of 11 positions shown; findings below may reference images not displayed]

----------------------------------------------------------------------

 ----------------------------------------------------------------------
Indications

  Hypertension - Chronic/Pre-existing
  (labetalol)
  33 weeks gestation of pregnancy
  Encounter for other antenatal screening
  follow-up
  Tobacco use complicating pregnancy, third
  trimester
  Obesity complicating pregnancy, third
  trimester
  Uterine fibroids affecting pregnancy in third  O34.13,
  trimester, antepartum
 ----------------------------------------------------------------------
Vital Signs

                                                Height:        5'8"
Fetal Evaluation

 Num Of Fetuses:          1
 Fetal Heart Rate(bpm):   158
 Cardiac Activity:        Observed
 Presentation:            Cephalic

 Amniotic Fluid
 AFI FV:      Within normal limits

 AFI Sum(cm)     %Tile       Largest Pocket(cm)
 20.26           76

 RUQ(cm)       RLQ(cm)       LUQ(cm)        LLQ(cm)

Biophysical Evaluation

 Amniotic F.V:   Within normal limits       F. Tone:         Observed
 F. Movement:    Observed                   Score:           [DATE]
 F. Breathing:   Observed
OB History

 Gravidity:    2         Term:   1        Prem:   0        SAB:   0
 TOP:          0       Ectopic:  0        Living: 1
Gestational Age

 LMP:           33w 4d        Date:  11/09/18                 EDD:   08/16/19
 Best:          33w 4d     Det. By:  LMP  (11/09/18)          EDD:   08/16/19
Impression

 Known Chronic hypertension on labetalol with stable blood
 pressure today. In additional Ms. Tarla is asymptomatic.

 Biophysical profile was performed today and was [DATE]

 Past growth was normal
Recommendations

 Continue weekly BPP
 Repat growth in 3 weeks.

## 2020-10-01 ENCOUNTER — Ambulatory Visit: Payer: Medicaid Other | Admitting: Family Medicine

## 2020-10-01 ENCOUNTER — Encounter: Payer: Self-pay | Admitting: Family Medicine

## 2020-10-01 ENCOUNTER — Other Ambulatory Visit: Payer: Self-pay

## 2020-10-01 DIAGNOSIS — I10 Essential (primary) hypertension: Secondary | ICD-10-CM | POA: Diagnosis not present

## 2020-10-01 MED ORDER — HYDROCHLOROTHIAZIDE 12.5 MG PO CAPS: 12.5000 mg | ORAL_CAPSULE | Freq: Every day | ORAL | 0 refills | Status: DC

## 2020-10-01 NOTE — Progress Notes (Signed)
    SUBJECTIVE:   CHIEF COMPLAINT / HPI:   Hypertension 146/96. Has been elevated. Trying to change her diet, decrease salt and fat. Does report stress with mom being sick.  Patient reports Blood pressure at home: does not check .  Medicaitons include: none, labetalol PRN but uses for headaches. Previously on hydrochlorothiazide but was discontinued during her pregnancy.  She does report elevated blood pressures during her pregnancy and using labetalol.  She has not been on any other medications since the birth of her child last year.  She has had multiple elevated blood pressure values in outpatient visits.  She reports working on her diet avoiding fried foods.  She is also exercising.  I did not ask her if she was continuing to smoke at this time. Denies headache, dizziness, visual changes, nausea, vomiting, chest pain, LE swelling, abdominal pain or shortness of breath.   Weight  Patient also concerned about weight gain.  She reports she has had difficulty especially since the birth of her last child.  She has tried phentermine in the past and would like to restart this today.  She does have a follow-up appointment with an outpatient clinic (not at [facility]) next Monday.  PERTINENT  PMH / PSH: hypertension, migraine   OBJECTIVE:   BP (!) 146/96   Pulse (!) 106   Ht 5\' 8"  (1.727 m)   Wt 274 lb (124.3 kg)   LMP 09/20/2020 (Exact Date)   SpO2 97%   BMI 41.66 kg/m   General: Well-appearing female, no acute distress.  No lower extremity edema.  ASSESSMENT/PLAN:   Essential hypertension, benign BP today is 146/96. Overall, needs improvement. Patient reports she does not like to be on medications and would like to take more natural methods, but is ultimately agreeable to restart hydrochlorothiazide.  We will restart at 12-1/2 mg with instructions to increase to 25 mg if her blood pressure remains above 140/90 at home. -Labs: BMP at follow-up -Encouraged patient to monitor blood pressures at  home, compliance with medications, and adherence to diet and exercise recommendations. -Follow-up in 2 to 3 weeks and bring medications to next appointment  Obesity, morbid, BMI 40.0-49.9 ([place]) Patient wanting to restart phentermine.  At this time, I do not recommend this given her hypertension.  I have also recommended follow-up with healthy weight and wellness or further evaluation at this clinic for other weight loss medications, however, patient does report she has an appointment at a bariatric surgery center next Monday.  Patient to follow-up with [place] as needed.   Wilber Oliphant, MD [place]

## 2020-10-01 NOTE — Assessment & Plan Note (Signed)
BP today is 146/96. Overall, needs improvement. Patient reports she does not like to be on medications and would like to take more natural methods, but is ultimately agreeable to restart hydrochlorothiazide.  We will restart at 12-1/2 mg with instructions to increase to 25 mg if her blood pressure remains above 140/90 at home. -Labs: BMP at follow-up -Encouraged patient to monitor blood pressures at home, compliance with medications, and adherence to diet and exercise recommendations. -Follow-up in 2 to 3 weeks and bring medications to next appointment

## 2020-10-01 NOTE — Patient Instructions (Signed)
You have high blood pressure. We are going to restart hydrochlorothiazide. Please start with 12.5 mg daily. After two weeks, if your blood pressures continue to be above 140 (top number) and 90 (bottom number), please increase the dose to 25 mg.  Follow up in 3 weeks

## 2020-10-01 NOTE — Assessment & Plan Note (Signed)
Patient wanting to restart phentermine.  At this time, I do not recommend this given her hypertension.  I have also recommended follow-up with healthy weight and wellness or further evaluation at this clinic for other weight loss medications, however, patient does report she has an appointment at a bariatric surgery center next Monday.  Patient to follow-up with [place] as needed.

## 2021-03-30 ENCOUNTER — Encounter: Payer: Self-pay | Admitting: Emergency Medicine

## 2021-03-30 ENCOUNTER — Other Ambulatory Visit: Payer: Self-pay

## 2021-03-30 ENCOUNTER — Ambulatory Visit
Admission: EM | Admit: 2021-03-30 | Discharge: 2021-03-30 | Disposition: A | Discharge status: Home / Self Care | Payer: Medicaid Other

## 2021-03-30 DIAGNOSIS — R079 Chest pain, unspecified: Secondary | ICD-10-CM | POA: Diagnosis not present

## 2021-03-30 NOTE — ED Triage Notes (Signed)
Patient c/o SOB x 2 days, described as more on the right side that radiates to her back.  Patient did state that after she drank a sprite she burped and it felt better.  No cough, history of asthma as a child.

## 2021-03-30 NOTE — ED Provider Notes (Signed)
[facility]    CSN: [id number] Arrival date & time: 03/30/21  1741      History   Chief Complaint Chief Complaint  Patient presents with   Shortness of Breath    HPI Terri [name] is a 35 y.o. female.   Patient here today for evaluation of right-sided chest pain that radiates into her shoulder that started last night.  She reports some associated shortness of breath.  She does admit to some anxiety though as well.  She states she drank some Sprite earlier and burped and this did help with the pressure.  She has not had any fever or chills.  She denies any nausea or vomiting.  She does not report anything like this happening in the past.  The history is provided by the patient.  Shortness of Breath Associated symptoms: chest pain   Associated symptoms: no abdominal pain, no fever and no vomiting    Past Medical History:  Diagnosis Date   Asthma    Chronic hypertension with superimposed severe preeclampsia 01/31/2019   Hypertension    Hypertention, malignant, with acute intensive management    Supervision of other high risk pregnancy, antepartum 01/07/2019   .[name] Nursing Staff Provider Office Location  [facility] Dating  LMP Language  English Anatomy US  Incomplete but wnl, f/u wnl Flu Vaccine  Declined Genetic Screen  NIPS:   AFP:   First Screen:  Quad:   TDaP vaccine    Hgb A1C or  GTT Early  Third trimester nl 2 hour Rhogam     LAB RESULTS  Feeding Plan Breast & Bottle Blood Type O/Positive/-- (08/13 1132)  Contraception Declined Antibody Negative (08/13    Patient Active Problem List   Diagnosis Date Noted   Obesity, morbid, BMI 40.0-49.9 ([place]) 10/01/2020   Postpartum depression 08/20/2019   Migraine 11/12/2014   Mixed incontinence 10/03/2013   Nephrolithiasis 06/18/2013   Unspecified episodic mood disorder 04/05/2011   Essential hypertension, benign 03/21/2011   TOBACCO USER 06/14/2009   Obesity in pregnancy, antepartum 01/05/2009   ASTHMA, INTERMITTENT  08/09/2006    History reviewed. No pertinent surgical history.  OB History     Gravida  2   Para  2   Term  2   Preterm      AB      Living  2      SAB      IAB      Ectopic      Multiple  0   Live Births  2            Home Medications    Prior to Admission medications   Medication Sig Start Date End Date Taking? Authorizing Provider  Blood Pressure Monitoring KIT 1 kit by Does not apply route once a week. 01/07/19   Shelly Bombard, MD  hydrochlorothiazide (MICROZIDE) 12.5 MG capsule Take 1 capsule (12.5 mg total) by mouth daily. 10/01/20   Wilber Oliphant, MD  hydrALAZINE (APRESOLINE) 50 MG tablet Take 1 tablet (50 mg total) by mouth every 8 (eight) hours. Patient not taking: Reported on 09/10/2019 07/26/19 09/12/19  Emily Filbert, MD  NIFEdipine (ADALAT CC) 60 MG 24 hr tablet Take 1 tablet (60 mg total) by mouth 2 (two) times daily. Patient not taking: Reported on 09/10/2019 07/26/19 09/12/19  Emily Filbert, MD    Family History Family History  Problem Relation Age of Onset   Hypertension Mother    Cancer Maternal Grandfather  Cancer Paternal Grandfather     Social History Social History   Tobacco Use   Smoking status: Former    Packs/day: 0.20    Types: Cigarettes    Quit date: 11/24/2018    Years since quitting: 2.3   Smokeless tobacco: Former    Types: Snuff  Vaping Use   Vaping Use: Never used  Substance Use Topics   Alcohol use: Not Currently    Comment: 11/2018   Drug use: Not Currently    Types: Marijuana    Comment: 12/11/2018; previously used     Allergies   Vicodin [hydrocodone-acetaminophen]   Review of Systems Review of Systems  Constitutional:  Negative for chills and fever.  HENT:  Negative for congestion and rhinorrhea.   Eyes:  Negative for discharge and redness.  Respiratory:  Positive for shortness of breath.   Cardiovascular:  Positive for chest pain.  Gastrointestinal:  Negative for abdominal pain, nausea and vomiting.   Genitourinary:  Positive for vaginal bleeding and vaginal discharge.    Physical Exam Triage Vital Signs ED Triage Vitals  Enc Vitals Group     BP 03/30/21 1827 (!) 173/122     Pulse Rate 03/30/21 1827 87     Resp 03/30/21 1827 20     Temp 03/30/21 1827 98.1 F (36.7 C)     Temp Source 03/30/21 1827 Oral     SpO2 03/30/21 1827 98 %     Weight 03/30/21 1829 195 lb (88.5 kg)     Height 03/30/21 1829 $RemoveBefor'5\' 8"'CEhxfzdQjyIM$  (1.727 m)     Head Circumference --      Peak Flow --      Pain Score 03/30/21 1829 8     Pain Loc --      Pain Edu? --      Excl. in [place]? --    No data found.  Updated Vital Signs BP (!) 173/122 (BP Location: Left Arm)   Pulse 87   Temp 98.1 F (36.7 C) (Oral)   Resp 20   Ht $R'5\' 8"'py$  (1.727 m)   Wt 195 lb (88.5 kg)   LMP 03/14/2021   SpO2 98%   Breastfeeding No   BMI 29.65 kg/m   Physical Exam Vitals and nursing note reviewed.  Constitutional:      General: She is not in acute distress.    Appearance: Normal appearance. She is not ill-appearing.  HENT:     Head: Normocephalic and atraumatic.  Eyes:     Conjunctiva/sclera: Conjunctivae normal.  Cardiovascular:     Rate and Rhythm: Normal rate and regular rhythm.     Heart sounds: Normal heart sounds. No murmur heard. Pulmonary:     Effort: Pulmonary effort is normal. No respiratory distress.     Breath sounds: Normal breath sounds. No wheezing, rhonchi or rales.  Neurological:     Mental Status: She is alert.  Psychiatric:        Mood and Affect: Mood normal.        Behavior: Behavior normal.        Thought Content: Thought content normal.     UC Treatments / Results  Labs (all labs ordered are listed, but only abnormal results are displayed) Labs Reviewed - No data to display  EKG   Radiology No results found.  Procedures Procedures (including critical care time)  Medications Ordered in UC Medications - No data to display  Initial Impression / Assessment and Plan / UC Course  I have  reviewed  the triage vital signs and the nursing notes.  Pertinent labs & imaging results that were available during my care of the patient were reviewed by me and considered in my medical decision making (see chart for details).  I suspect some of her symptoms are related to anxiety but did discuss other etiologies including gas, gallbladder etiology and pulmonary embolism.  Vitals appear normal with the exception of her blood pressure, but patient states she has not been taking her medication and I strongly recommended she follow-up with her primary care doctor for further evaluation of this.  Recommended emergency room if no improvement with Gas-X in the next few hours.  Patient expresses understanding.  Final Clinical Impressions(s) / UC Diagnoses   Final diagnoses:  Right-sided chest pain   Discharge Instructions   None    ED Prescriptions   None    PDMP not reviewed this encounter.   Francene Finders, PA-C 03/30/21 [phone]

## 2021-03-31 ENCOUNTER — Emergency Department (HOSPITAL_COMMUNITY): Payer: Medicaid Other

## 2021-03-31 ENCOUNTER — Emergency Department (HOSPITAL_COMMUNITY)
Admission: EM | Admit: 2021-03-31 | Discharge: 2021-03-31 | Disposition: A | Discharge status: Home / Self Care | Payer: Medicaid Other | Attending: Emergency Medicine | Admitting: Emergency Medicine

## 2021-03-31 ENCOUNTER — Encounter (HOSPITAL_COMMUNITY): Payer: Self-pay

## 2021-03-31 DIAGNOSIS — Z5321 Procedure and treatment not carried out due to patient leaving prior to being seen by health care provider: Secondary | ICD-10-CM | POA: Diagnosis not present

## 2021-03-31 DIAGNOSIS — R0789 Other chest pain: Secondary | ICD-10-CM | POA: Diagnosis not present

## 2021-03-31 DIAGNOSIS — R0602 Shortness of breath: Secondary | ICD-10-CM | POA: Diagnosis not present

## 2021-03-31 DIAGNOSIS — R079 Chest pain, unspecified: Secondary | ICD-10-CM | POA: Insufficient documentation

## 2021-03-31 DIAGNOSIS — I10 Essential (primary) hypertension: Secondary | ICD-10-CM

## 2021-03-31 LAB — CBC WITH DIFFERENTIAL/PLATELET
Abs Immature Granulocytes: 0.01 10*3/uL (ref 0.00–0.07)
Basophils Absolute: 0 10*3/uL (ref 0.0–0.1)
Basophils Relative: 0 %
Eosinophils Absolute: 0.1 10*3/uL (ref 0.0–0.5)
Eosinophils Relative: 2 %
HCT: 39.1 % (ref 36.0–46.0)
Hemoglobin: 12.5 g/dL (ref 12.0–15.0)
Immature Granulocytes: 0 %
Lymphocytes Relative: 44 %
Lymphs Abs: 2 10*3/uL (ref 0.7–4.0)
MCH: 29.3 pg (ref 26.0–34.0)
MCHC: 32 g/dL (ref 30.0–36.0)
MCV: 91.8 fL (ref 80.0–100.0)
Monocytes Absolute: 0.4 10*3/uL (ref 0.1–1.0)
Monocytes Relative: 8 %
Neutro Abs: 2.1 10*3/uL (ref 1.7–7.7)
Neutrophils Relative %: 46 %
Platelets: 307 10*3/uL (ref 150–400)
RBC: 4.26 MIL/uL (ref 3.87–5.11)
RDW: 13.7 % (ref 11.5–15.5)
WBC: 4.6 10*3/uL (ref 4.0–10.5)
nRBC: 0 % (ref 0.0–0.2)

## 2021-03-31 LAB — BASIC METABOLIC PANEL
Anion gap: 7 (ref 5–15)
BUN: 13 mg/dL (ref 6–20)
CO2: 27 mmol/L (ref 22–32)
Calcium: 8.7 mg/dL — ABNORMAL LOW (ref 8.9–10.3)
Chloride: 102 mmol/L (ref 98–111)
Creatinine, Ser: 0.85 mg/dL (ref 0.44–1.00)
GFR, Estimated: >60 mL/min (ref >60)
Glucose, Bld: 95 mg/dL (ref 70–99)
Potassium: 3.7 mmol/L (ref 3.5–5.1)
Sodium: 136 mmol/L (ref 135–145)

## 2021-03-31 LAB — D-DIMER, QUANTITATIVE: D-Dimer, Quant: 0.27 ug/mL-FEU (ref 0.00–0.50)

## 2021-03-31 LAB — TROPONIN I (HIGH SENSITIVITY): Troponin I (High Sensitivity): 7 ng/L (ref <18)

## 2021-03-31 MED ORDER — HYDROCHLOROTHIAZIDE 12.5 MG PO CAPS: 12.5000 mg | ORAL_CAPSULE | Freq: Every day | ORAL | 2 refills | Status: DC

## 2021-03-31 NOTE — Discharge Instructions (Addendum)
Follow up with your primary care MD for recheck.

## 2021-03-31 NOTE — ED Triage Notes (Signed)
Pt presents with c/o shortness of breath for 2 days. Pt reports she went to UC and was checked out but she still feels short of breath.

## 2021-03-31 NOTE — ED Provider Notes (Signed)
Emergency Medicine Provider Triage Evaluation Note  Terri [name] , a 35 y.o. female  was evaluated in triage.  Pt complains of right-sided chest pain.  Pain has been intermittent over the last 3 days.  Pain is present with deep inhalation.  Patient endorses associated shortness of breath.  Review of Systems  Positive: Chest pain, shortness of breath Negative: Fever, chills, rhinorrhea, nasal congestion, sore throat, cough, abdominal pain, nausea, vomiting  Physical Exam  BP (!) 169/112 (BP Location: Right Arm)   Pulse 95   Temp 98.7 F (37.1 C) (Oral)   Resp 18   LMP 03/14/2021   SpO2 99%  Gen:   Awake, no distress   Resp:  Normal effort, lungs clear to auscultation bilaterally MSK:   Moves extremities without difficulty  Other:    Medical Decision Making  Medically screening exam initiated at 2:43 PM.  Appropriate orders placed.  Terri [name] was informed that the remainder of the evaluation will be completed by another provider, this initial triage assessment does not replace that evaluation, and the importance of remaining in the ED until their evaluation is complete.  Right-sided chest pain with inhalation.  Low suspicion for PE as patient can be ruled out via [facility] criteria.   Loni Beckwith, PA-C 03/31/21 1444    Drenda Freeze, MD 03/31/21 [phone]

## 2021-04-01 ENCOUNTER — Telehealth: Payer: Self-pay

## 2021-04-01 NOTE — Telephone Encounter (Signed)
Transition Care Management Unsuccessful Follow-up Telephone Call  Date of discharge and from where:  03/31/2021-[facility]  Attempts:  1st Attempt  Reason for unsuccessful TCM follow-up call:  Left voice message

## 2021-04-02 NOTE — Telephone Encounter (Signed)
Transition Care Management Unsuccessful Follow-up Telephone Call  Date of discharge and from where:  03/31/2021 from [facility]  Attempts:  2nd Attempt  Reason for unsuccessful TCM follow-up call:  Left voice message

## 2021-04-04 NOTE — Telephone Encounter (Signed)
Transition Care Management Unsuccessful Follow-up Telephone Call  Date of discharge and from where:  03/31/2021 from [facility] ED  Attempts:  3rd Attempt  Reason for unsuccessful TCM follow-up call:  Unable to reach patient

## 2021-05-04 ENCOUNTER — Other Ambulatory Visit: Payer: Self-pay

## 2021-05-04 ENCOUNTER — Emergency Department (HOSPITAL_COMMUNITY)
Admission: EM | Admit: 2021-05-04 | Discharge: 2021-05-04 | Disposition: A | Discharge status: Home / Self Care | Payer: Medicaid Other | Attending: Emergency Medicine | Admitting: Emergency Medicine

## 2021-05-04 ENCOUNTER — Encounter (HOSPITAL_COMMUNITY): Payer: Self-pay

## 2021-05-04 DIAGNOSIS — I10 Essential (primary) hypertension: Secondary | ICD-10-CM | POA: Insufficient documentation

## 2021-05-04 DIAGNOSIS — Z87891 Personal history of nicotine dependence: Secondary | ICD-10-CM | POA: Diagnosis not present

## 2021-05-04 DIAGNOSIS — Z79899 Other long term (current) drug therapy: Secondary | ICD-10-CM | POA: Insufficient documentation

## 2021-05-04 DIAGNOSIS — J45909 Unspecified asthma, uncomplicated: Secondary | ICD-10-CM | POA: Insufficient documentation

## 2021-05-04 DIAGNOSIS — H1031 Unspecified acute conjunctivitis, right eye: Secondary | ICD-10-CM | POA: Diagnosis not present

## 2021-05-04 DIAGNOSIS — H5711 Ocular pain, right eye: Secondary | ICD-10-CM | POA: Diagnosis present

## 2021-05-04 MED ORDER — NAPROXEN 500 MG PO TABS
500.0000 mg | ORAL_TABLET | Freq: Once | ORAL | Status: AC
  Administered 2021-05-04: 500 mg via ORAL
  Filled 2021-05-04: qty 1

## 2021-05-04 MED ORDER — TETRACAINE HCL 0.5 % OP SOLN
1.0000 [drp] | Freq: Once | OPHTHALMIC | Status: AC
  Administered 2021-05-04: 1 [drp] via OPHTHALMIC
  Filled 2021-05-04: qty 4

## 2021-05-04 MED ORDER — NAPROXEN 500 MG PO TABS: 500.0000 mg | ORAL_TABLET | Freq: Two times a day (BID) | ORAL | 0 refills | Status: DC | PRN

## 2021-05-04 MED ORDER — FLUORESCEIN SODIUM 1 MG OP STRP
1.0000 | ORAL_STRIP | Freq: Once | OPHTHALMIC | Status: AC
  Administered 2021-05-04: 1 via OPHTHALMIC
  Filled 2021-05-04: qty 1

## 2021-05-04 MED ORDER — ERYTHROMYCIN 5 MG/GM OP OINT
TOPICAL_OINTMENT | Freq: Once | OPHTHALMIC | Status: AC
  Filled 2021-05-04: qty 3.5

## 2021-05-04 NOTE — ED Notes (Signed)
Visual acuity completed: 20/20 affected eye (right), 20/20 left eye

## 2021-05-04 NOTE — Discharge Instructions (Addendum)
You were seen in the emergency department today and found to have conjunctivitis of your right eye.  We are sending home with erythromycin ophthalmic ointment, please apply 1/2 inch ribbon of the ointment 5 times per day for the next 7 days.  We are sending you home with naproxen to help with pain and swelling.   - Naproxen- this is a nonsteroidal anti-inflammatory medication that will help with pain and swelling. Be sure to take this medication as prescribed with food, 1 pill every 12 hours,  It should be taken with food, as it can cause stomach upset, and more seriously, stomach bleeding. Do not take other nonsteroidal anti-inflammatory medications with this such as Advil, Motrin, Aleve, Mobic, Goodie Powder, or Motrin etc..    We have prescribed you new medication(s) today. Discuss the medications prescribed today with your pharmacist as they can have adverse effects and interactions with your other medicines including over the counter and prescribed medications. Seek medical evaluation if you start to experience new or abnormal symptoms after taking one of these medicines, seek care immediately if you start to experience difficulty breathing, feeling of your throat closing, facial swelling, or rash as these could be indications of a more serious allergic reaction  Do not apply lashes/makeup to the eyes.  Do not use any products you have used in the past 48 hours again as they are likely contaminated.   Please follow-up with primary care and/or ophthalmology.  Return to the emergency department for new or worsening symptoms including but not limited to new or worsening pain, swelling/redness around her eye, trouble moving the eye, swelling of the eye, change in your vision, fever, or any other concerns.  Additionally your blood pressure was high in the ER, please have this rechecked by primary care within 1 month.

## 2021-05-04 NOTE — ED Provider Notes (Signed)
[facility] DEPT Provider Note   CSN: [id number] Arrival date & time: 05/04/21  4628     History Chief Complaint  Patient presents with   Eye Problem    Terri [name] is a 35 y.o. female with a history of hypertension who presents to the emergency department with complaints of right eye irritation that began yesterday.  Patient reports right eye has been painful, swollen, and having drainage that crusted her eye shut this morning.  No alleviating or aggravating factors to her symptoms.  She does wear false eyelashes and is unsure if this is contributing, but does not have them on and did not use any new products.  She denies fever, chills, double vision, blurry vision, or loss of vision.  She is not a contact lens wearer.  HPI     Past Medical History:  Diagnosis Date   Asthma    Chronic hypertension with superimposed severe preeclampsia 01/31/2019   Hypertension    Hypertention, malignant, with acute intensive management    Supervision of other high risk pregnancy, antepartum 01/07/2019   .[name] Nursing Staff Provider Office Location  [facility] Dating  LMP Language  English Anatomy US  Incomplete but wnl, f/u wnl Flu Vaccine  Declined Genetic Screen  NIPS:   AFP:   First Screen:  Quad:   TDaP vaccine    Hgb A1C or  GTT Early  Third trimester nl 2 hour Rhogam     LAB RESULTS  Feeding Plan Breast & Bottle Blood Type O/Positive/-- (08/13 1132)  Contraception Declined Antibody Negative (08/13    Patient Active Problem List   Diagnosis Date Noted   Obesity, morbid, BMI 40.0-49.9 ([place]) 10/01/2020   Postpartum depression 08/20/2019   Migraine 11/12/2014   Mixed incontinence 10/03/2013   Nephrolithiasis 06/18/2013   Unspecified episodic mood disorder 04/05/2011   Essential hypertension, benign 03/21/2011   TOBACCO USER 06/14/2009   Obesity in pregnancy, antepartum 01/05/2009   ASTHMA, INTERMITTENT 08/09/2006    History reviewed. No pertinent surgical  history.   OB History     Gravida  2   Para  2   Term  2   Preterm      AB      Living  2      SAB      IAB      Ectopic      Multiple  0   Live Births  2           Family History  Problem Relation Age of Onset   Hypertension Mother    Cancer Maternal Grandfather    Cancer Paternal Grandfather     Social History   Tobacco Use   Smoking status: Former    Packs/day: 0.20    Types: Cigarettes    Quit date: 11/24/2018    Years since quitting: 2.4   Smokeless tobacco: Former    Types: Snuff  Vaping Use   Vaping Use: Never used  Substance Use Topics   Alcohol use: Not Currently    Comment: 11/2018   Drug use: Not Currently    Types: Marijuana    Comment: 12/11/2018; previously used    Home Medications Prior to Admission medications   Medication Sig Start Date End Date Taking? Authorizing Provider  hydrochlorothiazide (MICROZIDE) 12.5 MG capsule Take 1 capsule (12.5 mg total) by mouth daily. 03/31/21  Yes Fransico Meadow, PA-C  Blood Pressure Monitoring KIT 1 kit by Does not apply route once a week.  01/07/19   Shelly Bombard, MD  hydrALAZINE (APRESOLINE) 50 MG tablet Take 1 tablet (50 mg total) by mouth every 8 (eight) hours. Patient not taking: Reported on 09/10/2019 07/26/19 09/12/19  Emily Filbert, MD  NIFEdipine (ADALAT CC) 60 MG 24 hr tablet Take 1 tablet (60 mg total) by mouth 2 (two) times daily. Patient not taking: Reported on 09/10/2019 07/26/19 09/12/19  Emily Filbert, MD    Allergies    Vicodin [hydrocodone-acetaminophen]  Review of Systems   Review of Systems  Constitutional:  Negative for chills and fever.  HENT:  Negative for congestion, ear pain and sore throat.   Eyes:  Positive for pain, discharge and redness. Negative for visual disturbance.  Respiratory:  Negative for cough and shortness of breath.   Cardiovascular:  Negative for chest pain.  All other systems reviewed and are negative.  Physical Exam Updated Vital Signs BP (!)  160/105   Pulse 90   Temp (!) 97.4 F (36.3 C) (Oral)   Resp 18   Ht 5' 8"  (1.727 m)   Wt 96.2 kg   SpO2 99%   BMI 32.23 kg/m   Physical Exam Vitals and nursing note reviewed.  Constitutional:      General: She is not in acute distress.    Appearance: She is well-developed.  HENT:     Head: Normocephalic and atraumatic.  Eyes:     General:        Right eye: Discharge (mild) present.        Left eye: No discharge.     Conjunctiva/sclera:     Right eye: Right conjunctiva is injected. Chemosis present.     Left eye: Left conjunctiva is not injected. No chemosis.    Comments: Pupils equal round and reactive to light.  Extraocular movements are intact.  Visual acuity 20/20 bilaterally.  No periorbital edema, erythema, or tenderness to palpation.  Fluorescein stain performed, no uptake, no obvious corneal abrasion, ulceration, or dendritic staining.   Cardiovascular:     Rate and Rhythm: Normal rate.  Pulmonary:     Effort: Pulmonary effort is normal.  Musculoskeletal:     Cervical back: Neck supple.  Neurological:     Mental Status: She is alert.     Comments: Clear speech.   Psychiatric:        Behavior: Behavior normal.        Thought Content: Thought content normal.    ED Results / Procedures / Treatments   Labs (all labs ordered are listed, but only abnormal results are displayed) Labs Reviewed - No data to display  EKG None  Radiology No results found.  Procedures Procedures   Medications Ordered in ED Medications  fluorescein ophthalmic strip 1 strip (1 strip Right Eye Given 05/04/21 0551)  tetracaine (PONTOCAINE) 0.5 % ophthalmic solution 1 drop (1 drop Right Eye Given 05/04/21 0551)    ED Course  I have reviewed the triage vital signs and the nursing notes.  Pertinent labs & imaging results that were available during my care of the patient were reviewed by me and considered in my medical decision making (see chart for details).    MDM  Rules/Calculators/A&P                           Patient presents to the emergency department with complaints of right eye discomfort and drainage.  She is nontoxic, resting comfortably, her initial blood pressure was 207/135 taken  by triage tech-cuff size appears to have been too small, appropriate cuff size placed and blood pressure is 160/105.  BP is elevated, however I do not suspect hypertensive emergency.  Patient has findings of conjunctival injection, chemosis, and some drainage to the right eye consistent with conjunctivitis, she is not a contact lens wearer, will treat with antibiotic ointment which will be provided in the emergency department for patient to go home with.  There is no fluorescein uptake on exam, no indication of corneal abrasion/ulceration or HSV. Patient is afebrile,  without proptosis, entrapment, or consensual photophobia, no periorbital swelling- doubt periorbital or orbital cellulitis. No significant visual acuity deficit. Patient overall appears appropriate for discharge home. I discussed treatment plan, need for follow-up, and return precautions with the patient. Provided opportunity for questions, patient confirmed understanding and is in agreement with plan. Discussed w/ attending Dr. Stark Jock- in agreement.     Final Clinical Impression(s) / ED Diagnoses Final diagnoses:  Acute conjunctivitis of right eye, unspecified acute conjunctivitis type    Rx / DC Orders ED Discharge Orders          Ordered    naproxen (NAPROSYN) 500 MG tablet  2 times daily PRN        05/04/21 0608           Last creatinine WNL on chart review   Amaryllis Dyke, PA-C 05/04/21 7614    Veryl Speak, MD 05/04/21 [phone]

## 2021-05-04 NOTE — ED Triage Notes (Signed)
Pt reports with redness, swelling, and discharge from her right eye that started yesterday. Pt states that she wears lashes and isn't sure if that's the cause of her issue.

## 2021-05-06 ENCOUNTER — Telehealth: Payer: Self-pay

## 2021-05-06 NOTE — Telephone Encounter (Signed)
Transition Care Management Follow-up Telephone Call Date of discharge and from where: 05/04/2021-[facility]  How have you been since you were released from the hospital? Patient stated she is doing ok. She has not picked up her medications yet but she will today.  Any questions or concerns? No  Items Reviewed: Did the pt receive and understand the discharge instructions provided? Yes  Medications obtained and verified? Yes  Other? No  Any new allergies since your discharge? No  Dietary orders reviewed? No Do you have support at home? Yes   Home Care and Equipment/Supplies: Were home health services ordered? not applicable If so, what is the name of the agency? N/A  Has the agency set up a time to come to the patient's home? not applicable Were any new equipment or medical supplies ordered?  No What is the name of the medical supply agency? N/A Were you able to get the supplies/equipment? not applicable Do you have any questions related to the use of the equipment or supplies? No  Functional Questionnaire: (I = Independent and D = Dependent) ADLs: I  Bathing/Dressing- I  Meal Prep- I  Eating- I  Maintaining continence- I  Transferring/Ambulation- I  Managing Meds- I  Follow up appointments reviewed:  PCP Hospital f/u appt confirmed? No   Specialist Hospital f/u appt confirmed? No   Are transportation arrangements needed? No  If their condition worsens, is the pt aware to call PCP or go to the Emergency Dept.? Yes Was the patient provided with contact information for the PCP's office or ED? Yes Was to pt encouraged to call back with questions or concerns? Yes

## 2021-11-01 ENCOUNTER — Other Ambulatory Visit: Payer: Self-pay

## 2021-11-01 ENCOUNTER — Encounter (HOSPITAL_BASED_OUTPATIENT_CLINIC_OR_DEPARTMENT_OTHER): Payer: Self-pay

## 2021-11-01 ENCOUNTER — Ambulatory Visit
Admission: EM | Admit: 2021-11-01 | Discharge: 2021-11-01 | Disposition: A | Discharge status: Home / Self Care | Payer: Medicaid Other

## 2021-11-01 ENCOUNTER — Emergency Department (HOSPITAL_BASED_OUTPATIENT_CLINIC_OR_DEPARTMENT_OTHER)
Admission: EM | Admit: 2021-11-01 | Discharge: 2021-11-01 | Disposition: A | Discharge status: Home / Self Care | Payer: Medicaid Other | Attending: Emergency Medicine | Admitting: Emergency Medicine

## 2021-11-01 ENCOUNTER — Other Ambulatory Visit (HOSPITAL_BASED_OUTPATIENT_CLINIC_OR_DEPARTMENT_OTHER): Payer: Self-pay

## 2021-11-01 DIAGNOSIS — I160 Hypertensive urgency: Secondary | ICD-10-CM | POA: Diagnosis not present

## 2021-11-01 DIAGNOSIS — M549 Dorsalgia, unspecified: Secondary | ICD-10-CM

## 2021-11-01 DIAGNOSIS — R200 Anesthesia of skin: Secondary | ICD-10-CM | POA: Diagnosis not present

## 2021-11-01 DIAGNOSIS — M542 Cervicalgia: Secondary | ICD-10-CM | POA: Diagnosis not present

## 2021-11-01 DIAGNOSIS — I10 Essential (primary) hypertension: Secondary | ICD-10-CM | POA: Diagnosis not present

## 2021-11-01 DIAGNOSIS — Z79899 Other long term (current) drug therapy: Secondary | ICD-10-CM | POA: Insufficient documentation

## 2021-11-01 DIAGNOSIS — R202 Paresthesia of skin: Secondary | ICD-10-CM

## 2021-11-01 LAB — CBC WITH DIFFERENTIAL/PLATELET
Abs Immature Granulocytes: 0.01 10*3/uL (ref 0.00–0.07)
Basophils Absolute: 0 10*3/uL (ref 0.0–0.1)
Basophils Relative: 0 %
Eosinophils Absolute: 0.1 10*3/uL (ref 0.0–0.5)
Eosinophils Relative: 2 %
HCT: 37.6 % (ref 36.0–46.0)
Hemoglobin: 12.4 g/dL (ref 12.0–15.0)
Immature Granulocytes: 0 %
Lymphocytes Relative: 46 %
Lymphs Abs: 2.2 10*3/uL (ref 0.7–4.0)
MCH: 29.7 pg (ref 26.0–34.0)
MCHC: 33 g/dL (ref 30.0–36.0)
MCV: 90 fL (ref 80.0–100.0)
Monocytes Absolute: 0.4 10*3/uL (ref 0.1–1.0)
Monocytes Relative: 8 %
Neutro Abs: 2.1 10*3/uL (ref 1.7–7.7)
Neutrophils Relative %: 44 %
Platelets: 281 10*3/uL (ref 150–400)
RBC: 4.18 MIL/uL (ref 3.87–5.11)
RDW: 13.4 % (ref 11.5–15.5)
WBC: 4.8 10*3/uL (ref 4.0–10.5)
nRBC: 0 % (ref 0.0–0.2)

## 2021-11-01 LAB — BASIC METABOLIC PANEL
Anion gap: 4 — ABNORMAL LOW (ref 5–15)
BUN: 11 mg/dL (ref 6–20)
CO2: 24 mmol/L (ref 22–32)
Calcium: 8.6 mg/dL — ABNORMAL LOW (ref 8.9–10.3)
Chloride: 107 mmol/L (ref 98–111)
Creatinine, Ser: 0.99 mg/dL (ref 0.44–1.00)
GFR, Estimated: >60 mL/min (ref >60)
Glucose, Bld: 105 mg/dL — ABNORMAL HIGH (ref 70–99)
Potassium: 4.1 mmol/L (ref 3.5–5.1)
Sodium: 135 mmol/L (ref 135–145)

## 2021-11-01 MED ORDER — CYCLOBENZAPRINE HCL 10 MG PO TABS
10.0000 mg | ORAL_TABLET | Freq: Three times a day (TID) | ORAL | 1 refills | Status: DC | PRN
  Filled 2021-11-01: qty 20, 7d supply, fill #0

## 2021-11-01 MED ORDER — NAPROXEN 500 MG PO TABS
500.0000 mg | ORAL_TABLET | Freq: Two times a day (BID) | ORAL | 0 refills | Status: DC
  Filled 2021-11-01: qty 14, 7d supply, fill #0

## 2021-11-01 MED ORDER — HYDROCHLOROTHIAZIDE 25 MG PO TABS
25.0000 mg | ORAL_TABLET | Freq: Every day | ORAL | 1 refills | Status: DC
  Filled 2021-11-01: qty 30, 30d supply, fill #0
  Filled 2021-12-02: qty 30, 30d supply, fill #1

## 2021-11-01 MED ORDER — HYDROCODONE-ACETAMINOPHEN 5-325 MG PO TABS: 1.0000 | ORAL_TABLET | Freq: Four times a day (QID) | ORAL | 0 refills | Status: DC | PRN

## 2021-11-01 MED ORDER — HYDROCODONE-ACETAMINOPHEN 5-325 MG PO TABS
1.0000 | ORAL_TABLET | Freq: Four times a day (QID) | ORAL | 0 refills | Status: DC | PRN
  Filled 2021-11-01: qty 20, 5d supply, fill #0

## 2021-11-01 MED ORDER — HYDROCHLOROTHIAZIDE 25 MG PO TABS
25.0000 mg | ORAL_TABLET | Freq: Once | ORAL | Status: AC
  Administered 2021-11-01: 25 mg via ORAL
  Filled 2021-11-01: qty 1

## 2021-11-01 NOTE — ED Triage Notes (Addendum)
Pt reports she went to urgent care today due to back pain and right neck . No injury noted . Sent here due to complaints of tingling right arm  x 2 days and elevated BP 201/151

## 2021-11-01 NOTE — ED Triage Notes (Signed)
Pt states for a a week she has been having a pulling sensation in her neck down to her right shoulder.

## 2021-11-01 NOTE — Discharge Instructions (Addendum)
Take the hydrochlorothiazide 25 mg once a day for the blood pressure.  Make an appointment to follow-up with [facility] for recheck of blood pressure.  For the muscular neck pain upper back pain take the Flexeril and hydrocodone as needed for pain.  Also take the Naprosyn along with it.  This is an anti-inflammatory.  I can appointment follow-up with sports medicine upstairs.  Rest is much as possible for the next 2 days.

## 2021-11-01 NOTE — Discharge Instructions (Signed)
We have discussed transport to the hospital for rule out of a TIA, stroke. You have declined transport by ambulance. Please head straight to the hospital now.

## 2021-11-01 NOTE — ED Provider Notes (Signed)
[place]   MRN: [id number] DOB: 24-Sep-1985  Subjective:   Terri [name] is a 36 y.o. female presenting for 1 week history of persistent and worsening 10 out of 10 severe right-sided neck pain, neck tightness.  Symptoms radiate into the trapezius.  Does not have any pain along her midline but is just completely right-sided.  She does have some numbness and tingling about the right forearm and hand.  Regarding her blood pressure, she is out of her blood pressure medication.  Denies a headache, confusion, vision changes, chest pain, belly pain, hematuria.  No history of stroke, heart disease, heart attack.  No current facility-administered medications for this encounter.  Current Outpatient Medications:    Blood Pressure Monitoring KIT, 1 kit by Does not apply route once a week., Disp: 1 kit, Rfl: 0   hydrochlorothiazide (MICROZIDE) 12.5 MG capsule, Take 1 capsule (12.5 mg total) by mouth daily., Disp: 30 capsule, Rfl: 2   naproxen (NAPROSYN) 500 MG tablet, Take 1 tablet (500 mg total) by mouth 2 (two) times daily as needed for moderate pain., Disp: 15 tablet, Rfl: 0   Allergies  Allergen Reactions   Vicodin [Hydrocodone-Acetaminophen] Itching    Past Medical History:  Diagnosis Date   Asthma    Chronic hypertension with superimposed severe preeclampsia 01/31/2019   Hypertension    Hypertention, malignant, with acute intensive management    Supervision of other high risk pregnancy, antepartum 01/07/2019   .[name] Nursing Staff Provider Office Location  [facility] Dating  LMP Language  English Anatomy US  Incomplete but wnl, f/u wnl Flu Vaccine  Declined Genetic Screen  NIPS:   AFP:   First Screen:  Quad:   TDaP vaccine    Hgb A1C or  GTT Early  Third trimester nl 2 hour Rhogam     LAB RESULTS  Feeding Plan Breast & Bottle Blood Type O/Positive/-- (08/13 1132)  Contraception Declined Antibody Negative (08/13     History reviewed. No pertinent surgical history.  Family  History  Problem Relation Age of Onset   Hypertension Mother    Cancer Maternal Grandfather    Cancer Paternal Grandfather     Social History   Tobacco Use   Smoking status: Former    Packs/day: 0.20    Types: Cigarettes    Quit date: 11/24/2018    Years since quitting: 2.9   Smokeless tobacco: Former    Types: Snuff  Vaping Use   Vaping Use: Never used  Substance Use Topics   Alcohol use: Not Currently    Comment: 11/2018   Drug use: Not Currently    Types: Marijuana    Comment: 12/11/2018; previously used    ROS   Objective:   Vitals: BP (!) 194/117 (BP Location: Right Arm) Comment: pt denies vision changes or headaches  Pulse 82   Temp 98.8 F (37.1 C) (Oral)   Resp 20   LMP 10/04/2021   SpO2 97%   Last blood pressure recheck was 203/137.  BP Readings from Last 3 Encounters:  11/01/21 (!) 194/117  05/04/21 (!) 160/105  03/31/21 (!) 160/111   Physical Exam Constitutional:      General: She is not in acute distress.    Appearance: Normal appearance. She is well-developed. She is not ill-appearing, toxic-appearing or diaphoretic.  HENT:     Head: Normocephalic and atraumatic.     Nose: Nose normal.     Mouth/Throat:     Mouth: Mucous membranes are moist.  Eyes:     General: No scleral icterus.       Right eye: No discharge.        Left eye: No discharge.     Extraocular Movements: Extraocular movements intact.  Cardiovascular:     Rate and Rhythm: Normal rate.  Pulmonary:     Effort: Pulmonary effort is normal.  Musculoskeletal:     Cervical back: Torticollis and tenderness (Along the right paraspinal muscles extending into the trapezius and upper right paraspinal muscles) present. No swelling, edema, deformity, erythema, signs of trauma, lacerations, rigidity, spasms, bony tenderness or crepitus. Pain with movement present. Decreased range of motion (Rotation and flexion from the right side).     Comments: No midline tenderness.  Skin:    General:  Skin is warm and dry.  Neurological:     General: No focal deficit present.     Mental Status: She is alert and oriented to person, place, and time.     Cranial Nerves: No cranial nerve deficit.     Motor: No weakness.     Coordination: Coordination normal.     Gait: Gait normal.     Deep Tendon Reflexes: Reflexes normal.     Comments: Negative Romberg and pronator drift, no facial asymmetry.  Psychiatric:        Mood and Affect: Mood normal.        Behavior: Behavior normal.        Thought Content: Thought content normal.        Judgment: Judgment normal.    Assessment and Plan :   PDMP not reviewed this encounter.  1. Hypertensive urgency   2. Numbness and tingling in right hand   3. Neck pain on right side   4. Upper back pain on right side    Patient has concerning symptoms that warrant further evaluation and intervention than we can provide in the urgent care setting.  My primary concern is that she has an acute encephalopathy, TIA or developing stroke.  She has severely uncontrolled hypertension worse than is normal for her based off of chart review.  This could be exacerbated by her pain but as she has neurologic symptoms in the right arm needs to have emergent work-up.  We discussed transport by ambulance which the patient declined due to car spurting.  As she does not have any active neurologic physical exam findings I was agreeable to having her go straight to the hospital now.  Patient plans on going to the med center at [facility].   Jaynee Eagles, PA-C 11/01/21 1407

## 2021-11-01 NOTE — ED Provider Notes (Signed)
[facility] EMERGENCY DEPARTMENT Provider Note   CSN: [id number] Arrival date & time: 11/01/21  1420     History  Chief Complaint  Patient presents with   Numbness    Terri [name] is a 36 y.o. female.  Patient sent from wound over [facility] urgent [facility] patient with about 2-week history of left-sided normal lateral neck pain that goes into the upper part of the back, trapezius muscle distribution worse with movement worse with some movement of the right upper extremity.  No fall or injury.  Patient does have some tingling in her digits of her right hand predominantly thumb and index.  No weakness.  No other upper extremity abnormalities.  No low back pain.  Patient thought that she slept on her neck wrong.  But it has been getting worse today it was significantly worse she works as a Theme park manager.  Is getting difficult to work because of the discomfort.  Patient has a history of hypertension.  Seen by [facility] back in April.  Started on 12.5 mg of hydrochlorothiazide.  Patient ran out.  Or misplaced it.  She was trying natural medications to help her blood pressure.  She had a high blood pressure upon arrival to the urgent care and also to here.  Blood pressure was 184/112.  No significant chest pain no headache.  No visual changes no speech problems.  No facial numbness no facial droop.  No shortness of breath.      Home Medications Prior to Admission medications   Medication Sig Start Date End Date Taking? Authorizing Provider  cyclobenzaprine (FLEXERIL) 10 MG tablet Take 1 tablet (10 mg total) by mouth 3 (three) times daily as needed for muscle spasms. 11/01/21  Yes Fredia Sorrow, MD  hydrochlorothiazide (HYDRODIURIL) 25 MG tablet Take 1 tablet (25 mg total) by mouth daily. 11/01/21  Yes Fredia Sorrow, MD  naproxen (NAPROSYN) 500 MG tablet Take 1 tablet (500 mg total) by mouth 2 (two) times daily. 11/01/21  Yes Fredia Sorrow, MD  Blood Pressure Monitoring KIT 1  kit by Does not apply route once a week. 01/07/19   Shelly Bombard, MD  hydrochlorothiazide (MICROZIDE) 12.5 MG capsule Take 1 capsule (12.5 mg total) by mouth daily. 03/31/21   Fransico Meadow, PA-C  HYDROcodone-acetaminophen (NORCO/VICODIN) 5-325 MG tablet Take 1 tablet by mouth every 6 (six) hours as needed for moderate pain. 11/01/21   Fredia Sorrow, MD  naproxen (NAPROSYN) 500 MG tablet Take 1 tablet (500 mg total) by mouth 2 (two) times daily as needed for moderate pain. 05/04/21   Petrucelli, Samantha R, PA-C  hydrALAZINE (APRESOLINE) 50 MG tablet Take 1 tablet (50 mg total) by mouth every 8 (eight) hours. Patient not taking: Reported on 09/10/2019 07/26/19 09/12/19  Emily Filbert, MD  NIFEdipine (ADALAT CC) 60 MG 24 hr tablet Take 1 tablet (60 mg total) by mouth 2 (two) times daily. Patient not taking: Reported on 09/10/2019 07/26/19 09/12/19  Emily Filbert, MD      Allergies    Vicodin [hydrocodone-acetaminophen]    Review of Systems   Review of Systems  Constitutional:  Negative for chills and fever.  HENT:  Negative for ear pain and sore throat.   Eyes:  Negative for pain and visual disturbance.  Respiratory:  Negative for cough and shortness of breath.   Cardiovascular:  Negative for chest pain and palpitations.  Gastrointestinal:  Negative for abdominal pain and vomiting.  Genitourinary:  Negative for dysuria and hematuria.  Musculoskeletal:  Positive for neck pain. Negative for arthralgias and back pain.  Skin:  Negative for color change and rash.  Neurological:  Positive for numbness. Negative for seizures, syncope, facial asymmetry, speech difficulty, weakness and headaches.  All other systems reviewed and are negative.  Physical Exam Updated Vital Signs BP (!) 187/125   Pulse 79   Temp 98.2 F (36.8 C) (Oral)   Resp 17   Ht 1.727 m (5' 8" )   Wt 95.3 kg   LMP 10/04/2021   SpO2 100%   BMI 31.93 kg/m  Physical Exam Vitals and nursing note reviewed.  Constitutional:       General: She is not in acute distress.    Appearance: Normal appearance. She is well-developed.  HENT:     Head: Normocephalic and atraumatic.     Mouth/Throat:     Mouth: Mucous membranes are moist.  Eyes:     Extraocular Movements: Extraocular movements intact.     Conjunctiva/sclera: Conjunctivae normal.     Pupils: Pupils are equal, round, and reactive to light.  Cardiovascular:     Rate and Rhythm: Normal rate and regular rhythm.     Heart sounds: No murmur heard. Pulmonary:     Effort: Pulmonary effort is normal. No respiratory distress.     Breath sounds: Normal breath sounds.  Abdominal:     Palpations: Abdomen is soft.     Tenderness: There is no abdominal tenderness.  Musculoskeletal:        General: No swelling.     Cervical back: Normal range of motion and neck supple.  Skin:    General: Skin is warm and dry.     Capillary Refill: Capillary refill takes less than 2 seconds.  Neurological:     General: No focal deficit present.     Mental Status: She is alert and oriented to person, place, and time.     Cranial Nerves: No cranial nerve deficit.     Sensory: No sensory deficit.     Motor: No weakness.  Psychiatric:        Mood and Affect: Mood normal.    ED Results / Procedures / Treatments   Labs (all labs ordered are listed, but only abnormal results are displayed) Labs Reviewed  BASIC METABOLIC PANEL - Abnormal; Notable for the following components:      Result Value   Glucose, Bld 105 (*)    Calcium 8.6 (*)    Anion gap 4 (*)    All other components within normal limits  CBC WITH DIFFERENTIAL/PLATELET    EKG EKG Interpretation  Date/Time:  Tuesday Nov 01 2021 14:34:29 EDT Ventricular Rate:  86 PR Interval:  214 QRS Duration: 137 QT Interval:  414 QTC Calculation: 496 R Axis:   69 Text Interpretation: Sinus rhythm Prolonged PR interval Right atrial enlargement Right bundle branch block ST elev, probable normal early repol pattern Confirmed  by Madalyn Rob [phone]) on 11/01/2021 3:14:50 PM  Radiology No results found.  Procedures Procedures    Medications Ordered in ED Medications  hydrochlorothiazide (HYDRODIURIL) tablet 25 mg (25 mg Oral Given 11/01/21 1440)    ED Course/ Medical Decision Making/ A&P                           Medical Decision Making  Patient's blood pressure is elevated.  Patient given hydrochlorothiazide 25 mg here.  She is out of her hydrochlorothiazide side at home.  Will renew it  at 25 mg.  Before she was on 12.5 mg.  She is followed by [facility] family medicine for that patient will make an appointment to follow-up.  No evidence of any acute hypertensive emergency.  Patient does work to control her blood pressure better.  And may need medication be on hydrochlorothiazide.  No concerns for any kind of acute stroke symptoms.  Patient has musculoskeletal lateral neck pain kind of into the upper part of the back along the trapezius muscle pattern.  Hurts to move her right arm as well.  There is some tingling to some of the fingers of the right arm possible secondary to nerve root compression.  No fall or injury nothing traumatic feel that she does not need CT head or neck.  Neck pain seems to be musculoskeletal we will treat with Naprosyn hydrocodone Flexeril and rest.  Have her follow-up with sports medicine.  Patient also follow back up with primary care doctor and however continue the hydrochlorothiazide for now.  CBC without any acute findings.  Hemoglobin normal basic metabolic panel without any significant abnormalities renal function is normal potassium is normal.  EKG without any acute findings.  Does show some evidence of right bundle branch block.  May be some early repole.  Patient again not having any anterior chest pain and the pain that she is having is very muscular skeletal in nature from the right side of her neck to the back area.  Oxygen levels are good patient is 100% on room air.   Patient not tachycardic no concerns for pulmonary embolus or other acute chest pain etiologies   Final Clinical Impression(s) / ED Diagnoses Final diagnoses:  Neck pain on right side  Primary hypertension    Rx / DC Orders ED Discharge Orders          Ordered    hydrochlorothiazide (HYDRODIURIL) 25 MG tablet  Daily        11/01/21 1546    cyclobenzaprine (FLEXERIL) 10 MG tablet  3 times daily PRN        11/01/21 1546    HYDROcodone-acetaminophen (NORCO/VICODIN) 5-325 MG tablet  Every 6 hours PRN,   Status:  Discontinued        11/01/21 1546    HYDROcodone-acetaminophen (NORCO/VICODIN) 5-325 MG tablet  Every 6 hours PRN        11/01/21 1548    naproxen (NAPROSYN) 500 MG tablet  2 times daily        11/01/21 1548              Fredia Sorrow, MD 11/01/21 1600

## 2021-11-02 ENCOUNTER — Telehealth: Payer: Self-pay

## 2021-11-02 NOTE — Telephone Encounter (Signed)
Transition Care Management Unsuccessful Follow-up Telephone Call  Date of discharge and from where:  11/01/2021-[facility]   Attempts:  1st Attempt  Reason for unsuccessful TCM follow-up call:  Left voice message

## 2021-11-03 NOTE — Telephone Encounter (Signed)
Transition Care Management Unsuccessful Follow-up Telephone Call  Date of discharge and from where:  11/01/2021[facility]   Attempts:  2nd Attempt  Reason for unsuccessful TCM follow-up call:  Left voice message

## 2021-11-04 NOTE — Telephone Encounter (Signed)
Transition Care Management Unsuccessful Follow-up Telephone Call  Date of discharge and from where:  11/01/2021[facility]   Attempts:  3rd Attempt  Reason for unsuccessful TCM follow-up call:  Unable to leave message

## 2021-11-15 ENCOUNTER — Encounter: Payer: Self-pay | Admitting: *Deleted

## 2021-12-02 ENCOUNTER — Other Ambulatory Visit (HOSPITAL_BASED_OUTPATIENT_CLINIC_OR_DEPARTMENT_OTHER): Payer: Self-pay

## 2021-12-23 ENCOUNTER — Ambulatory Visit
Admission: EM | Admit: 2021-12-23 | Discharge: 2021-12-23 | Disposition: A | Discharge status: Home / Self Care | Payer: Medicaid Other | Attending: Emergency Medicine | Admitting: Emergency Medicine

## 2021-12-23 DIAGNOSIS — N898 Other specified noninflammatory disorders of vagina: Secondary | ICD-10-CM | POA: Insufficient documentation

## 2021-12-23 DIAGNOSIS — L02419 Cutaneous abscess of limb, unspecified: Secondary | ICD-10-CM | POA: Diagnosis not present

## 2021-12-23 DIAGNOSIS — R8281 Pyuria: Secondary | ICD-10-CM | POA: Diagnosis not present

## 2021-12-23 DIAGNOSIS — L03119 Cellulitis of unspecified part of limb: Secondary | ICD-10-CM

## 2021-12-23 LAB — POCT URINALYSIS DIP (MANUAL ENTRY)
Bilirubin, UA: NEGATIVE
Blood, UA: NEGATIVE
Glucose, UA: NEGATIVE mg/dL
Ketones, POC UA: NEGATIVE mg/dL
Nitrite, UA: NEGATIVE
Spec Grav, UA: 1.025 (ref 1.010–1.025)
Urobilinogen, UA: 0.2 E.U./dL
pH, UA: 6 (ref 5.0–8.0)

## 2021-12-23 LAB — POCT URINE PREGNANCY: Preg Test, Ur: NEGATIVE

## 2021-12-23 MED ORDER — FLUCONAZOLE 150 MG PO TABS: ORAL_TABLET | ORAL | 0 refills | Status: DC

## 2021-12-23 MED ORDER — AMLODIPINE BESYLATE 5 MG PO TABS: 5.0000 mg | ORAL_TABLET | Freq: Every day | ORAL | 2 refills | Status: DC

## 2021-12-23 MED ORDER — SULFAMETHOXAZOLE-TRIMETHOPRIM 800-160 MG PO TABS: 1.0000 | ORAL_TABLET | Freq: Two times a day (BID) | ORAL | 0 refills | Status: AC

## 2021-12-23 MED ORDER — HYDROCHLOROTHIAZIDE 25 MG PO TABS: 25.0000 mg | ORAL_TABLET | Freq: Every morning | ORAL | 2 refills | Status: DC

## 2021-12-23 NOTE — Discharge Instructions (Addendum)
The urinalysis that we performed in the clinic today was abnormal.  Urine culture will be performed per our protocol.  The result of the urine culture will be available in the next 3 to 5 days and will be posted to your MyChart account.  If there is an abnormal finding, you will be contacted by phone and advised of further treatment recommendations, if any.   You were advised to begin antibiotics today because there were white blood cells seen in your urine and you have abscesses in your upper thighs that require antibiotic treatment.  Because we know that antibiotic treatment can often cause vaginal yeast infections, I have also provided you with a prescription for fluconazole (Diflucan).  Please take the first tablet on the third day of your antibiotics and take the second tablet two days after the first tablet.   The results of your vaginal swab test which screens for BV, yeast, gonorrhea, chlamydia and trichomonas will be made available to you once it is complete.  This typically takes 3 to 5 days.  Please abstain from sexual intercourse of any kind, vaginal, oral or anal, until you have received the results of your STD testing.    The results will initially be posted to your MyChart account and, if any of your results are abnormal, you will receive a phone call regarding treatment.  Prescriptions, if any are needed, will be provided for you at your pharmacy.    If you have not have relief of your symptoms after completing treatment, please feel free to follow-up for repeat evaluation.  I sent a prescription for amlodipine 5 mg to your pharmacy, please take 1 tablet daily along with your hydrochlorothiazide, recommend that you take both together in the morning, you may take these with or without food as you prefer.  Please follow-up with your primary care provider regarding your blood pressure in the next 2 to 4 weeks to ensure that you are on the correct dose and that you have good blood pressure  control with these medications.  Thank you for visiting urgent care today.

## 2021-12-23 NOTE — ED Provider Notes (Signed)
[facility]    CSN: [id number] Arrival date & time: 12/23/21  1153    HISTORY   Chief Complaint  Patient presents with   Abscess   Vaginal Discharge   HPI Terri [name] is a pleasant, 36 y.o. female who presents to urgent care today complaining of Yellow vaginal discharge.  Patient states the discharge does not have an unusual odor but it is not 1 normal color.  Patient states she is currently sexually active, not consistent with using condoms.  Patient states she has been taking her blood pressure medication daily, blood pressure is significantly elevated on arrival today.  Patient also complains of abscesses on the upper inner aspect of both thighs, states she had these when she was younger and is not sure whether coming back but thinks it may be related to the vaginal discharge.  The history is provided by the patient.   Past Medical History:  Diagnosis Date   Asthma    Chronic hypertension with superimposed severe preeclampsia 01/31/2019   Hypertension    Hypertention, malignant, with acute intensive management    Supervision of other high risk pregnancy, antepartum 01/07/2019   .[name] Nursing Staff Provider Office Location  [place] Dating  LMP Language  English Anatomy US  Incomplete but wnl, f/u wnl Flu Vaccine  Declined Genetic Screen  NIPS:   AFP:   First Screen:  Quad:   TDaP vaccine    Hgb A1C or  GTT Early  Third trimester nl 2 hour Rhogam     LAB RESULTS  Feeding Plan Breast & Bottle Blood Type O/Positive/-- (08/13 1132)  Contraception Declined Antibody Negative (08/13   Patient Active Problem List   Diagnosis Date Noted   Obesity, morbid, BMI 40.0-49.9 ([place]) 10/01/2020   Postpartum depression 08/20/2019   Migraine 11/12/2014   Mixed incontinence 10/03/2013   Nephrolithiasis 06/18/2013   Unspecified episodic mood disorder 04/05/2011   Essential hypertension, benign 03/21/2011   TOBACCO USER 06/14/2009   Obesity in pregnancy, antepartum 01/05/2009   ASTHMA,  INTERMITTENT 08/09/2006   History reviewed. No pertinent surgical history. OB History     Gravida  2   Para  2   Term  2   Preterm      AB      Living  2      SAB      IAB      Ectopic      Multiple  0   Live Births  2          Home Medications    Prior to Admission medications   Medication Sig Start Date End Date Taking? Authorizing Provider  Blood Pressure Monitoring KIT 1 kit by Does not apply route once a week. 01/07/19   Shelly Bombard, MD  cyclobenzaprine (FLEXERIL) 10 MG tablet Take 1 tablet (10 mg total) by mouth 3 (three) times daily as needed for muscle spasms. 11/01/21   Fredia Sorrow, MD  hydrochlorothiazide (HYDRODIURIL) 25 MG tablet Take 1 tablet (25 mg total) by mouth daily. 11/01/21   Fredia Sorrow, MD  hydrochlorothiazide (MICROZIDE) 12.5 MG capsule Take 1 capsule (12.5 mg total) by mouth daily. 03/31/21   Fransico Meadow, PA-C  HYDROcodone-acetaminophen (NORCO/VICODIN) 5-325 MG tablet Take 1 tablet by mouth every 6 (six) hours as needed for moderate pain. 11/01/21   Fredia Sorrow, MD  naproxen (NAPROSYN) 500 MG tablet Take 1 tablet (500 mg total) by mouth 2 (two) times daily as needed for moderate pain. 05/04/21  Petrucelli, Samantha R, PA-C  naproxen (NAPROSYN) 500 MG tablet Take 1 tablet (500 mg total) by mouth 2 (two) times daily. 11/01/21   Fredia Sorrow, MD  hydrALAZINE (APRESOLINE) 50 MG tablet Take 1 tablet (50 mg total) by mouth every 8 (eight) hours. Patient not taking: Reported on 09/10/2019 07/26/19 09/12/19  Emily Filbert, MD  NIFEdipine (ADALAT CC) 60 MG 24 hr tablet Take 1 tablet (60 mg total) by mouth 2 (two) times daily. Patient not taking: Reported on 09/10/2019 07/26/19 09/12/19  Emily Filbert, MD    Family History Family History  Problem Relation Age of Onset   Hypertension Mother    Cancer Maternal Grandfather    Cancer Paternal Grandfather    Social History Social History   Tobacco Use   Smoking status: Former     Packs/day: 0.20    Types: Cigarettes    Quit date: 11/24/2018    Years since quitting: 3.0   Smokeless tobacco: Former    Types: Snuff  Vaping Use   Vaping Use: Never used  Substance Use Topics   Alcohol use: Yes    Comment: drinks liquor   Drug use: Yes    Types: Marijuana    Comment: 12/11/2018; previously used   Allergies   Vicodin [hydrocodone-acetaminophen]  Review of Systems Review of Systems Pertinent findings revealed after performing a 14 point review of systems has been noted in the history of present illness.  Physical Exam Triage Vital Signs ED Triage Vitals  Enc Vitals Group     BP 04/08/21 0827 (!) 147/82     Pulse Rate 04/08/21 0827 72     Resp 04/08/21 0827 18     Temp 04/08/21 0827 98.3 F (36.8 C)     Temp Source 04/08/21 0827 Oral     SpO2 04/08/21 0827 98 %     Weight --      Height --      Head Circumference --      Peak Flow --      Pain Score 04/08/21 0826 5     Pain Loc --      Pain Edu? --      Excl. in [place]? --   No data found.  Updated Vital Signs BP (!) 173/133 (BP Location: Left Arm) Comment: provider aware  Pulse 85   Temp 98.3 F (36.8 C) (Oral)   Resp 18   LMP 12/07/2021   SpO2 98%   Physical Exam Vitals and nursing note reviewed.  Constitutional:      General: She is not in acute distress.    Appearance: Normal appearance. She is not ill-appearing.  HENT:     Head: Normocephalic and atraumatic.  Eyes:     General: Lids are normal.        Right eye: No discharge.        Left eye: No discharge.     Extraocular Movements: Extraocular movements intact.     Conjunctiva/sclera: Conjunctivae normal.     Right eye: Right conjunctiva is not injected.     Left eye: Left conjunctiva is not injected.     Pupils: Pupils are equal, round, and reactive to light.  Neck:     Trachea: Trachea and phonation normal.  Cardiovascular:     Rate and Rhythm: Normal rate and regular rhythm.     Pulses: Normal pulses.     Heart sounds: Normal  heart sounds. No murmur heard.    No friction rub. No gallop.  Pulmonary:     Effort: Pulmonary effort is normal. No accessory muscle usage, prolonged expiration or respiratory distress.     Breath sounds: Normal breath sounds. No stridor, decreased air movement or transmitted upper airway sounds. No decreased breath sounds, wheezing, rhonchi or rales.  Chest:     Chest wall: No tenderness.  Abdominal:     General: Abdomen is flat. Bowel sounds are normal. There is no distension.     Palpations: Abdomen is soft.     Tenderness: There is no abdominal tenderness. There is no right CVA tenderness or left CVA tenderness.     Hernia: No hernia is present.  Genitourinary:    Comments: Patient politely declines pelvic exam today, patient provided a vaginal swab for testing. Musculoskeletal:        General: Normal range of motion.     Cervical back: Normal range of motion and neck supple. Normal range of motion.  Lymphadenopathy:     Cervical: No cervical adenopathy.  Skin:    General: Skin is warm and dry.     Findings: No erythema or rash.  Neurological:     General: No focal deficit present.     Mental Status: She is alert and oriented to person, place, and time. Mental status is at baseline.  Psychiatric:        Mood and Affect: Mood normal.        Behavior: Behavior normal.        Thought Content: Thought content normal.        Judgment: Judgment normal.     Visual Acuity Right Eye Distance:   Left Eye Distance:   Bilateral Distance:    Right Eye Near:   Left Eye Near:    Bilateral Near:     [facility] / Diagnostics / Procedures:     Radiology No results found.  Procedures Procedures (including critical care time) EKG  Pending results:  Labs Reviewed  POCT URINALYSIS DIP (MANUAL ENTRY) - Abnormal; Notable for the following components:      Result Value   Clarity, UA cloudy (*)    Protein Ur, POC trace (*)    Leukocytes, UA Large (3+) (*)    All other components  within normal limits  URINE CULTURE  POCT URINE PREGNANCY  CERVICOVAGINAL ANCILLARY ONLY    Medications Ordered in UC: Medications - No data to display  UC Diagnoses / Final Clinical Impressions(s)   I have reviewed the triage vital signs and the nursing notes.  Pertinent labs & imaging results that were available during my care of the patient were reviewed by me and considered in my medical decision making (see chart for details).    Final diagnoses:  Pyuria, sterile  Problematic vaginal discharge  Cellulitis and abscess of leg    STD screening was performed, patient advised that the results be posted to their MyChart and if any of the results are positive, they will be notified by phone, further treatment will be provided as indicated based on results of STD screening. Urinalysis today was normal. Urine pregnancy test was negative. Return precautions advised.  Drug allergies reviewed, all questions addressed.   Urine dip today was positive for pyuria, trace protein and cloudy appearance.  Urine culture will be performed per our protocol.   Patient was advised to begin antibiotics now due to findings on urine dip as well as the abscesses on her bilateral upper legs. Patient advised that they will be contacted with results of the  urine culture and that treatment will be provided as indicated based on results. Diflucan prescribed for inevitable vaginal yeast infection caused by antibiotic therapy. Return precautions advised.  ED Prescriptions     Medication Sig Dispense Auth. Provider   sulfamethoxazole-trimethoprim (BACTRIM DS) 800-160 MG tablet Take 1 tablet by mouth 2 (two) times daily for 7 days. 14 tablet Lynden Oxford Scales, PA-C   fluconazole (DIFLUCAN) 150 MG tablet Take 1 tablet on day 4 of antibiotics.  Take second tablet 3 days later. 2 tablet Lynden Oxford Scales, PA-C   amLODipine (NORVASC) 5 MG tablet Take 1 tablet (5 mg total) by mouth daily. 30 tablet Lynden Oxford Scales, PA-C   hydrochlorothiazide (HYDRODIURIL) 25 MG tablet Take 1 tablet (25 mg total) by mouth in the morning. 30 tablet Lynden Oxford Scales, PA-C      PDMP not reviewed this encounter.  Disposition Upon Discharge:  Condition: stable for discharge home  Patient presented with concern for an acute illness with associated systemic symptoms and significant discomfort requiring urgent management. In my opinion, this is a condition that a prudent lay person (someone who possesses an average knowledge of health and medicine) may potentially expect to result in complications if not addressed urgently such as respiratory distress, impairment of bodily function or dysfunction of bodily organs.   As such, the patient has been evaluated and assessed, work-up was performed and treatment was provided in alignment with urgent care protocols and evidence based medicine.  Patient/parent/caregiver has been advised that the patient may require follow up for further testing and/or treatment if the symptoms continue in spite of treatment, as clinically indicated and appropriate.  Routine symptom specific, illness specific and/or disease specific instructions were discussed with the patient and/or caregiver at length.  Prevention strategies for avoiding STD exposure were also discussed.  The patient will follow up with their current PCP if and as advised. If the patient does not currently have a PCP we will assist them in obtaining one.   The patient may need specialty follow up if the symptoms continue, in spite of conservative treatment and management, for further workup, evaluation, consultation and treatment as clinically indicated and appropriate.  Patient/parent/caregiver verbalized understanding and agreement of plan as discussed.  All questions were addressed during visit.  Please see discharge instructions below for further details of plan.  Discharge Instructions:   Discharge  Instructions      The urinalysis that we performed in the clinic today was abnormal.  Urine culture will be performed per our protocol.  The result of the urine culture will be available in the next 3 to 5 days and will be posted to your MyChart account.  If there is an abnormal finding, you will be contacted by phone and advised of further treatment recommendations, if any.   You were advised to begin antibiotics today because there were white blood cells seen in your urine and you have abscesses in your upper thighs that require antibiotic treatment.  Because we know that antibiotic treatment can often cause vaginal yeast infections, I have also provided you with a prescription for fluconazole (Diflucan).  Please take the first tablet on the third day of your antibiotics and take the second tablet two days after the first tablet.   The results of your vaginal swab test which screens for BV, yeast, gonorrhea, chlamydia and trichomonas will be made available to you once it is complete.  This typically takes 3 to 5 days.  Please abstain from  sexual intercourse of any kind, vaginal, oral or anal, until you have received the results of your STD testing.    The results will initially be posted to your MyChart account and, if any of your results are abnormal, you will receive a phone call regarding treatment.  Prescriptions, if any are needed, will be provided for you at your pharmacy.    If you have not have relief of your symptoms after completing treatment, please feel free to follow-up for repeat evaluation.  I sent a prescription for amlodipine 5 mg to your pharmacy, please take 1 tablet daily along with your hydrochlorothiazide, recommend that you take both together in the morning, you may take these with or without food as you prefer.  Please follow-up with your primary care provider regarding your blood pressure in the next 2 to 4 weeks to ensure that you are on the correct dose and that you have  good blood pressure control with these medications.  Thank you for visiting urgent care today.      This office note has been dictated using Museum/gallery curator.  Unfortunately, this method of dictation can sometimes lead to typographical or grammatical errors.  I apologize for your inconvenience in advance if this occurs.  Please do not hesitate to reach out to me if clarification is needed.       Lynden Oxford Scales, PA-C 12/23/21 1336

## 2021-12-23 NOTE — ED Triage Notes (Signed)
Pt c/o vaginal discharge (yellow), and multiple abscesses to her both of her thighs.   Started: last Wednesday

## 2021-12-24 LAB — URINE CULTURE: Culture: NO GROWTH

## 2021-12-26 LAB — CERVICOVAGINAL ANCILLARY ONLY
Bacterial Vaginitis (gardnerella): POSITIVE — AB
Candida Glabrata: NEGATIVE
Candida Vaginitis: NEGATIVE
Chlamydia: NEGATIVE
Comment: NEGATIVE
Comment: NEGATIVE
Comment: NEGATIVE
Comment: NEGATIVE
Comment: NEGATIVE
Comment: NORMAL
Neisseria Gonorrhea: NEGATIVE
Trichomonas: POSITIVE — AB

## 2021-12-27 ENCOUNTER — Telehealth (HOSPITAL_COMMUNITY): Payer: Self-pay | Admitting: Emergency Medicine

## 2021-12-27 MED ORDER — METRONIDAZOLE 500 MG PO TABS: 500.0000 mg | ORAL_TABLET | Freq: Two times a day (BID) | ORAL | 0 refills | Status: DC

## 2022-03-23 ENCOUNTER — Ambulatory Visit: Payer: Medicaid Other | Admitting: Family Medicine

## 2022-03-23 ENCOUNTER — Ambulatory Visit: Payer: Medicaid Other

## 2022-03-27 ENCOUNTER — Other Ambulatory Visit: Payer: Self-pay

## 2022-03-27 ENCOUNTER — Emergency Department (HOSPITAL_BASED_OUTPATIENT_CLINIC_OR_DEPARTMENT_OTHER): Payer: Medicaid Other

## 2022-03-27 ENCOUNTER — Encounter (HOSPITAL_BASED_OUTPATIENT_CLINIC_OR_DEPARTMENT_OTHER): Payer: Self-pay | Admitting: Emergency Medicine

## 2022-03-27 ENCOUNTER — Emergency Department (HOSPITAL_BASED_OUTPATIENT_CLINIC_OR_DEPARTMENT_OTHER)
Admission: EM | Admit: 2022-03-27 | Discharge: 2022-03-27 | Disposition: A | Discharge status: Home / Self Care | Payer: Medicaid Other | Attending: Emergency Medicine | Admitting: Emergency Medicine

## 2022-03-27 DIAGNOSIS — W228XXA Striking against or struck by other objects, initial encounter: Secondary | ICD-10-CM | POA: Insufficient documentation

## 2022-03-27 DIAGNOSIS — S6991XA Unspecified injury of right wrist, hand and finger(s), initial encounter: Secondary | ICD-10-CM | POA: Diagnosis not present

## 2022-03-27 DIAGNOSIS — S63614A Unspecified sprain of right ring finger, initial encounter: Secondary | ICD-10-CM | POA: Diagnosis not present

## 2022-03-27 DIAGNOSIS — Z79899 Other long term (current) drug therapy: Secondary | ICD-10-CM | POA: Insufficient documentation

## 2022-03-27 DIAGNOSIS — M7989 Other specified soft tissue disorders: Secondary | ICD-10-CM | POA: Insufficient documentation

## 2022-03-27 NOTE — Discharge Instructions (Addendum)
Wear splint,  Ice to area of swelling.

## 2022-03-27 NOTE — ED Triage Notes (Signed)
Hit hand on car yesterday while removing son from car. Right hand ring finger, swollen and painful. Too painful to move.

## 2022-03-27 NOTE — ED Notes (Signed)
Pt verbalizes understanding of discharge instructions. Opportunity for questioning and answers were provided. Pt discharged from ED to home.   ? ?

## 2022-03-28 NOTE — ED Provider Notes (Signed)
[facility] Provider Note   CSN: [id number] Arrival date & time: 03/27/22  9678     History  Chief Complaint  Patient presents with   Hand Injury    Terri [name] is a 36 y.o. female.  Patient complains of swelling to her right ring finger after hitting it on a car door yesterday.  Patient reports finger is swollen and painful patient has pain with movement  The history is provided by the patient. No language interpreter was used.  Hand Injury Injury: yes   Time since incident:  1 day Pain details:    Quality:  Aching   Radiates to:  Does not radiate   Progression:  Worsening Dislocation: no        Home Medications Prior to Admission medications   Medication Sig Start Date End Date Taking? Authorizing Provider  amLODipine (NORVASC) 5 MG tablet Take 1 tablet (5 mg total) by mouth daily. 12/23/21 03/23/22  Lynden Oxford Scales, PA-C  Blood Pressure Monitoring KIT 1 kit by Does not apply route once a week. 01/07/19   Shelly Bombard, MD  fluconazole (DIFLUCAN) 150 MG tablet Take 1 tablet on day 4 of antibiotics.  Take second tablet 3 days later. 12/23/21   Lynden Oxford Scales, PA-C  hydrochlorothiazide (HYDRODIURIL) 25 MG tablet Take 1 tablet (25 mg total) by mouth in the morning. 12/23/21 03/23/22  Lynden Oxford Scales, PA-C  metroNIDAZOLE (FLAGYL) 500 MG tablet Take 1 tablet (500 mg total) by mouth 2 (two) times daily. 12/27/21   Lamptey, Myrene Galas, MD  naproxen (NAPROSYN) 500 MG tablet Take 1 tablet (500 mg total) by mouth 2 (two) times daily as needed for moderate pain. 05/04/21   Petrucelli, Samantha R, PA-C  naproxen (NAPROSYN) 500 MG tablet Take 1 tablet (500 mg total) by mouth 2 (two) times daily. 11/01/21   Fredia Sorrow, MD  hydrALAZINE (APRESOLINE) 50 MG tablet Take 1 tablet (50 mg total) by mouth every 8 (eight) hours. Patient not taking: Reported on 09/10/2019 07/26/19 09/12/19  Emily Filbert, MD  NIFEdipine (ADALAT CC) 60 MG 24 hr  tablet Take 1 tablet (60 mg total) by mouth 2 (two) times daily. Patient not taking: Reported on 09/10/2019 07/26/19 09/12/19  Emily Filbert, MD      Allergies    Vicodin [hydrocodone-acetaminophen]    Review of Systems   Review of Systems  All other systems reviewed and are negative.   Physical Exam Updated Vital Signs BP (!) 141/97   Pulse 82   Temp 98.3 F (36.8 C)   Resp 18   LMP 03/09/2022 (Approximate)   SpO2 100%  Physical Exam Vitals reviewed.  Constitutional:      Appearance: Normal appearance.  Musculoskeletal:        General: Swelling and tenderness present.     Comments: Swollen right ring finger decreased range of motion neurovascular neurosensory are intact  Skin:    General: Skin is warm.  Neurological:     General: No focal deficit present.     Mental Status: She is alert.  Psychiatric:        Mood and Affect: Mood normal.     ED Results / Procedures / Treatments   Labs (all labs ordered are listed, but only abnormal results are displayed) Labs Reviewed - No data to display  EKG None  Radiology DG Hand Complete Right  Result Date: 03/27/2022 CLINICAL DATA:  Trauma EXAM: RIGHT HAND - COMPLETE 3+ VIEW COMPARISON:  None  Available. FINDINGS: There is no evidence of fracture or dislocation. There is no evidence of arthropathy or other focal bone abnormality. Soft tissues are unremarkable. IMPRESSION: Negative. Electronically Signed   By: Donavan Foil M.D.   On: 03/27/2022 18:51    Procedures Procedures    Medications Ordered in ED Medications - No data to display  ED Course/ Medical Decision Making/ A&P                           Medical Decision Making Struck her finger on a car door yesterday.  Patient complains of decreased ability to flex and extend  Amount and/or Complexity of Data Reviewed Radiology: ordered and independent interpretation performed. Decision-making details documented in ED Course.    Details: X-ray shows no evidence of  fracture  Risk Risk Details: Patient is placed in a splint I am suspicious that she may have a ligamentous injury due to decreased ability to ask finger.  Patient is advised to follow-up with hand surgeon for further evaluation.           Final Clinical Impression(s) / ED Diagnoses Final diagnoses:  Sprain of right ring finger, unspecified site of digit, initial encounter    Rx / DC Orders ED Discharge Orders     None      An After Visit Summary was printed and given to the patient.    Fransico Meadow, PA-C 74/14/23 9532    Lianne Cure, DO 02/33/43 1830

## 2022-04-10 ENCOUNTER — Emergency Department (HOSPITAL_BASED_OUTPATIENT_CLINIC_OR_DEPARTMENT_OTHER)
Admission: EM | Admit: 2022-04-10 | Discharge: 2022-04-10 | Disposition: A | Discharge status: Home / Self Care | Payer: Medicaid Other | Attending: Emergency Medicine | Admitting: Emergency Medicine

## 2022-04-10 ENCOUNTER — Other Ambulatory Visit: Payer: Self-pay

## 2022-04-10 ENCOUNTER — Encounter (HOSPITAL_BASED_OUTPATIENT_CLINIC_OR_DEPARTMENT_OTHER): Payer: Self-pay | Admitting: *Deleted

## 2022-04-10 DIAGNOSIS — X503XXA Overexertion from repetitive movements, initial encounter: Secondary | ICD-10-CM | POA: Diagnosis not present

## 2022-04-10 DIAGNOSIS — J45909 Unspecified asthma, uncomplicated: Secondary | ICD-10-CM | POA: Insufficient documentation

## 2022-04-10 DIAGNOSIS — I10 Essential (primary) hypertension: Secondary | ICD-10-CM | POA: Diagnosis not present

## 2022-04-10 DIAGNOSIS — S39012A Strain of muscle, fascia and tendon of lower back, initial encounter: Secondary | ICD-10-CM | POA: Diagnosis not present

## 2022-04-10 DIAGNOSIS — S3992XA Unspecified injury of lower back, initial encounter: Secondary | ICD-10-CM | POA: Diagnosis present

## 2022-04-10 LAB — URINALYSIS, MICROSCOPIC (REFLEX)

## 2022-04-10 LAB — URINALYSIS, ROUTINE W REFLEX MICROSCOPIC
Bilirubin Urine: NEGATIVE
Glucose, UA: NEGATIVE mg/dL
Hgb urine dipstick: NEGATIVE
Ketones, ur: NEGATIVE mg/dL
Leukocytes,Ua: NEGATIVE
Nitrite: NEGATIVE
Protein, ur: 30 mg/dL — AB
Specific Gravity, Urine: 1.02 (ref 1.005–1.030)
pH: 6.5 (ref 5.0–8.0)

## 2022-04-10 LAB — PREGNANCY, URINE: Preg Test, Ur: NEGATIVE

## 2022-04-10 MED ORDER — KETOROLAC TROMETHAMINE 30 MG/ML IJ SOLN
30.0000 mg | Freq: Once | INTRAMUSCULAR | Status: AC
  Administered 2022-04-10: 30 mg via INTRAMUSCULAR
  Filled 2022-04-10: qty 1

## 2022-04-10 MED ORDER — DEXAMETHASONE SODIUM PHOSPHATE 10 MG/ML IJ SOLN
10.0000 mg | Freq: Once | INTRAMUSCULAR | Status: AC
  Administered 2022-04-10: 10 mg via INTRAMUSCULAR
  Filled 2022-04-10: qty 1

## 2022-04-10 MED ORDER — NAPROXEN 500 MG PO TABS: 500.0000 mg | ORAL_TABLET | Freq: Two times a day (BID) | ORAL | 0 refills | Status: AC

## 2022-04-10 MED ORDER — DICLOFENAC SODIUM 1 % EX GEL: 2.0000 g | Freq: Four times a day (QID) | CUTANEOUS | 0 refills | Status: DC | PRN

## 2022-04-10 MED ORDER — METHOCARBAMOL 500 MG PO TABS: 500.0000 mg | ORAL_TABLET | Freq: Three times a day (TID) | ORAL | 0 refills | Status: DC | PRN

## 2022-04-10 NOTE — ED Provider Notes (Signed)
Emergency Department Provider Note   I have reviewed the triage vital signs and the nursing notes.   HISTORY  Chief Complaint Back Pain   HPI Terri [name] is a 36 y.o. female with past history reviewed below presents emergency department for evaluation of lower back pain.  Symptoms began yesterday after walking downstairs.  Patient did not fall.  She does not recall a specific injury but felt the pain at that time and her lower back, slightly to the left.  She does have history of sciatica but pain is not going down her leg.  She is not experiencing numbness or weakness in her extremities.  No groin numbness.  No bowel or bladder incontinence or urinary retention.  No fevers or chills. No IVDA. Has been taking naproxen without relief.    Past Medical History:  Diagnosis Date   Asthma    Chronic hypertension with superimposed severe preeclampsia 01/31/2019   Hypertension    Hypertention, malignant, with acute intensive management    Supervision of other high risk pregnancy, antepartum 01/07/2019   .[name] Nursing Staff Provider Office Location  [facility] Dating  LMP Language  English Anatomy US  Incomplete but wnl, f/u wnl Flu Vaccine  Declined Genetic Screen  NIPS:   AFP:   First Screen:  Quad:   TDaP vaccine    Hgb A1C or  GTT Early  Third trimester nl 2 hour Rhogam     LAB RESULTS  Feeding Plan Breast & Bottle Blood Type O/Positive/-- (08/13 1132)  Contraception Declined Antibody Negative (08/13    Review of Systems  Constitutional: No fever/chills Cardiovascular: Denies chest pain. Respiratory: Denies shortness of breath. Gastrointestinal: No abdominal pain.   Genitourinary: Negative for dysuria. Musculoskeletal: Positive for back pain. Skin: Negative for rash. Neurological: Negative for headaches.   ____________________________________________   PHYSICAL EXAM:  VITAL SIGNS: ED Triage Vitals  Enc Vitals Group     BP 04/10/22 1014 (!) 159/111     Pulse Rate 04/10/22 1014  86     Resp 04/10/22 1014 18     Temp 04/10/22 1014 98 F (36.7 C)     Temp Source 04/10/22 1014 Oral     SpO2 04/10/22 1014 99 %   Constitutional: Alert and oriented. Well appearing and in no acute distress. Eyes: Conjunctivae are normal.  Head: Atraumatic. Nose: No congestion/rhinnorhea. Mouth/Throat: Mucous membranes are moist.   Neck: No stridor.   Cardiovascular: Normal rate, regular rhythm. Good peripheral circulation. Grossly normal heart sounds.   Respiratory: Normal respiratory effort.  No retractions. Lungs CTAB. Gastrointestinal: Soft and nontender. No distention.  Musculoskeletal: No lower extremity tenderness nor edema. No gross deformities of extremities. No midline lumbar spine tenderness.  Neurologic:  Normal speech and language. No gross focal neurologic deficits are appreciated.  Skin:  Skin is warm, dry and intact. No rash noted.  ____________________________________________   LABS (all labs ordered are listed, but only abnormal results are displayed)  Labs Reviewed  URINALYSIS, ROUTINE W REFLEX MICROSCOPIC - Abnormal; Notable for the following components:      Result Value   Protein, ur 30 (*)    All other components within normal limits  URINALYSIS, MICROSCOPIC (REFLEX) - Abnormal; Notable for the following components:   Bacteria, UA RARE (*)    All other components within normal limits  PREGNANCY, URINE    ____________________________________________   PROCEDURES  Procedure(s) performed:   Procedures  None  ____________________________________________   INITIAL IMPRESSION / ASSESSMENT AND PLAN / ED  COURSE  Pertinent labs & imaging results that were available during my care of the patient were reviewed by me and considered in my medical decision making (see chart for details).   This patient is Presenting for Evaluation of back pain, which does require a range of treatment options, and is a complaint that involves a high risk of morbidity and  mortality.  The Differential Diagnoses includes but is not exclusive to musculoskeletal back pain, renal colic, urinary tract infection, pyelonephritis, intra-abdominal causes of back pain, aortic aneurysm or dissection, cauda equina syndrome, sciatica, lumbar disc disease, thoracic disc disease, etc.   Clinical Laboratory Tests Ordered, included UA without evidence of infection.  Pregnancy negative.  Radiologic Tests: Considered spine imaging but patient without red flag signs or symptoms.  Exam is reassuring.  Plan to defer spine imaging for now.  Cardiac Monitor Tracing which shows NSR.    Social Determinants of Health Risk patient is not an IVDA.   Medical Decision Making: Summary:  Patient presents emergency department for evaluation of lower back pain after walking down the stairs.  Seems musculoskeletal.  No red flag signs or symptoms to prompt emergent MRI or other spine/abdominal imaging.  Plan for UA along with pregnancy and will treat empirically for MSK strain. Plan to give contact info for spine surgery follow up should symptoms worsen.   Reevaluation with update and discussion with patient regarding her urine results.  Plan for symptom management and outpatient PCP/spine follow-up.  Gave contact information at discharge. No red flag symptoms to prompt emergent MRI.   Disposition: discharge  ____________________________________________  FINAL CLINICAL IMPRESSION(S) / ED DIAGNOSES  Final diagnoses:  Strain of lumbar region, initial encounter     NEW OUTPATIENT MEDICATIONS STARTED DURING THIS VISIT:  Discharge Medication List as of 04/10/2022 11:02 AM     START taking these medications   Details  diclofenac Sodium (VOLTAREN) 1 % GEL Apply 2 g topically 4 (four) times daily as needed., Starting Mon 04/10/2022, Normal    methocarbamol (ROBAXIN) 500 MG tablet Take 1 tablet (500 mg total) by mouth every 8 (eight) hours as needed., Starting Mon 04/10/2022, Normal         Note:  This document was prepared using Dragon voice recognition software and may include unintentional dictation errors.  Nanda Quinton, MD, [facility]    Khyren Hing, Wonda Olds, MD 04/11/22 [phone]

## 2022-04-10 NOTE — Discharge Instructions (Signed)

## 2022-04-10 NOTE — ED Triage Notes (Signed)
States she awoke with lower back pain this am, denies any recent illness, falls, injuries to area. Able to ambulate to exam room without difficulty

## 2022-04-10 NOTE — ED Notes (Signed)
Patient reports she needs to pick up her bp medications.  She is to do that today.  She is alert and oriented.  She verbalized understanding of reasons to return to the ED and verbalized understanding of her discharge instructions.

## 2022-05-08 ENCOUNTER — Ambulatory Visit
Admission: EM | Admit: 2022-05-08 | Discharge: 2022-05-08 | Disposition: A | Discharge status: Home / Self Care | Payer: Medicaid Other

## 2022-05-08 DIAGNOSIS — M545 Low back pain, unspecified: Secondary | ICD-10-CM

## 2022-05-08 NOTE — Discharge Instructions (Signed)
Take naprosyn or ibuprofen for discomfort
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
## 2022-05-16 ENCOUNTER — Other Ambulatory Visit: Payer: Self-pay

## 2022-05-16 ENCOUNTER — Encounter (HOSPITAL_BASED_OUTPATIENT_CLINIC_OR_DEPARTMENT_OTHER): Payer: Self-pay | Admitting: Emergency Medicine

## 2022-05-16 ENCOUNTER — Emergency Department (HOSPITAL_BASED_OUTPATIENT_CLINIC_OR_DEPARTMENT_OTHER)
Admission: EM | Admit: 2022-05-16 | Discharge: 2022-05-16 | Discharge status: Against Medical Advice | Payer: Medicaid Other | Attending: Emergency Medicine | Admitting: Emergency Medicine

## 2022-05-16 DIAGNOSIS — M545 Low back pain, unspecified: Secondary | ICD-10-CM | POA: Insufficient documentation

## 2022-05-16 DIAGNOSIS — Z5321 Procedure and treatment not carried out due to patient leaving prior to being seen by health care provider: Secondary | ICD-10-CM | POA: Insufficient documentation

## 2022-05-16 DIAGNOSIS — Y92410 Unspecified street and highway as the place of occurrence of the external cause: Secondary | ICD-10-CM | POA: Diagnosis not present
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
## 2022-05-22 ENCOUNTER — Ambulatory Visit (INDEPENDENT_AMBULATORY_CARE_PROVIDER_SITE_OTHER): Payer: Medicaid Other | Admitting: Student

## 2022-05-22 VITALS — BP 124/78 | HR 94 | Ht 68.0 in | Wt 275.0 lb

## 2022-05-22 DIAGNOSIS — S134XXA Sprain of ligaments of cervical spine, initial encounter: Secondary | ICD-10-CM

## 2022-05-22 HISTORY — DX: Sprain of ligaments of cervical spine, initial encounter: S13.4XXA

## 2022-05-22 MED ORDER — METHOCARBAMOL 500 MG PO TABS: 1000.0000 mg | ORAL_TABLET | Freq: Three times a day (TID) | ORAL | 0 refills | Status: DC | PRN
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
## 2022-06-02 ENCOUNTER — Telehealth: Payer: Self-pay

## 2022-06-02 MED ORDER — LEVONORGESTREL 1.5 MG PO TABS: 1.5000 mg | ORAL_TABLET | Freq: Once | ORAL | 0 refills | Status: DC

## 2022-06-02 NOTE — Telephone Encounter (Signed)
Patient calls nurse line requesting prescription for plan B. Advised that patient could purchase this OTC. Patient is requesting prescription as it will be cheaper with prescription. Patient is requesting that this be sent to the [facility] on [address].   Patient had unprotected intercourse last night. Will forward to PCP.   Talbot Grumbling, RN

## 2022-07-12 ENCOUNTER — Ambulatory Visit
Admission: EM | Admit: 2022-07-12 | Discharge: 2022-07-12 | Disposition: A | Discharge status: Home / Self Care | Payer: Medicaid Other | Attending: Nurse Practitioner | Admitting: Nurse Practitioner

## 2022-07-12 DIAGNOSIS — J029 Acute pharyngitis, unspecified: Secondary | ICD-10-CM | POA: Diagnosis not present

## 2022-07-12 DIAGNOSIS — Z760 Encounter for issue of repeat prescription: Secondary | ICD-10-CM | POA: Diagnosis not present

## 2022-07-12 DIAGNOSIS — J020 Streptococcal pharyngitis: Secondary | ICD-10-CM | POA: Diagnosis not present

## 2022-07-12 LAB — POCT RAPID STREP A (OFFICE): Rapid Strep A Screen: POSITIVE — AB

## 2022-07-12 MED ORDER — HYDROCHLOROTHIAZIDE 25 MG PO TABS: 25.0000 mg | ORAL_TABLET | Freq: Every morning | ORAL | 2 refills | Status: DC

## 2022-07-12 MED ORDER — AMOXICILLIN 500 MG PO CAPS: 500.0000 mg | ORAL_CAPSULE | Freq: Two times a day (BID) | ORAL | 0 refills | Status: AC
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
## 2022-07-12 NOTE — ED Triage Notes (Signed)
Pt states that she has a sore throat and body aches. X2 days

## 2022-07-12 NOTE — Discharge Instructions (Signed)
Amoxicillin twice daily for 10 days Over-the-counter ibuprofen or Tylenol as needed for pain Warm liquids and salt water gargles I refilled your hydrochlorothiazide for 30 days.  Any additional refills will need to go with your primary. Rest and fluids Follow-up with the PCP in 2 to 3 days for recheck Please go to the emergency room if you have any worsening symptoms

## 2022-07-19 ENCOUNTER — Emergency Department (HOSPITAL_COMMUNITY): Payer: Medicaid Other

## 2022-07-19 ENCOUNTER — Other Ambulatory Visit: Payer: Self-pay

## 2022-07-19 ENCOUNTER — Observation Stay (HOSPITAL_COMMUNITY)
Admission: EM | Admit: 2022-07-19 | Discharge: 2022-07-20 | Disposition: A | Discharge status: Home / Self Care | Payer: Medicaid Other | Attending: Family Medicine | Admitting: Family Medicine

## 2022-07-19 ENCOUNTER — Encounter (HOSPITAL_COMMUNITY): Payer: Self-pay

## 2022-07-19 DIAGNOSIS — Z87891 Personal history of nicotine dependence: Secondary | ICD-10-CM | POA: Diagnosis not present

## 2022-07-19 DIAGNOSIS — E669 Obesity, unspecified: Secondary | ICD-10-CM | POA: Insufficient documentation

## 2022-07-19 DIAGNOSIS — R079 Chest pain, unspecified: Secondary | ICD-10-CM

## 2022-07-19 DIAGNOSIS — I10 Essential (primary) hypertension: Secondary | ICD-10-CM | POA: Diagnosis not present

## 2022-07-19 DIAGNOSIS — I471 Supraventricular tachycardia, unspecified: Secondary | ICD-10-CM | POA: Diagnosis not present

## 2022-07-19 DIAGNOSIS — J45909 Unspecified asthma, uncomplicated: Secondary | ICD-10-CM | POA: Diagnosis not present

## 2022-07-19 DIAGNOSIS — R7989 Other specified abnormal findings of blood chemistry: Secondary | ICD-10-CM | POA: Diagnosis present

## 2022-07-19 DIAGNOSIS — E876 Hypokalemia: Secondary | ICD-10-CM | POA: Diagnosis not present

## 2022-07-19 DIAGNOSIS — R0602 Shortness of breath: Secondary | ICD-10-CM | POA: Diagnosis not present

## 2022-07-19 DIAGNOSIS — I499 Cardiac arrhythmia, unspecified: Secondary | ICD-10-CM | POA: Diagnosis not present

## 2022-07-19 DIAGNOSIS — Z6839 Body mass index (BMI) 39.0-39.9, adult: Secondary | ICD-10-CM | POA: Diagnosis not present

## 2022-07-19 DIAGNOSIS — Z79899 Other long term (current) drug therapy: Secondary | ICD-10-CM | POA: Diagnosis not present

## 2022-07-19 DIAGNOSIS — I4891 Unspecified atrial fibrillation: Secondary | ICD-10-CM | POA: Diagnosis not present

## 2022-07-19 DIAGNOSIS — R0789 Other chest pain: Secondary | ICD-10-CM | POA: Diagnosis present

## 2022-07-19 DIAGNOSIS — R002 Palpitations: Secondary | ICD-10-CM | POA: Diagnosis not present

## 2022-07-19 LAB — CBC WITH DIFFERENTIAL/PLATELET
Abs Immature Granulocytes: 0.01 10*3/uL (ref 0.00–0.07)
Basophils Absolute: 0 10*3/uL (ref 0.0–0.1)
Basophils Relative: 1 %
Eosinophils Absolute: 0.1 10*3/uL (ref 0.0–0.5)
Eosinophils Relative: 1 %
HCT: 39 % (ref 36.0–46.0)
Hemoglobin: 13.2 g/dL (ref 12.0–15.0)
Immature Granulocytes: 0 %
Lymphocytes Relative: 47 %
Lymphs Abs: 3.1 10*3/uL (ref 0.7–4.0)
MCH: 29.8 pg (ref 26.0–34.0)
MCHC: 33.8 g/dL (ref 30.0–36.0)
MCV: 88 fL (ref 80.0–100.0)
Monocytes Absolute: 0.5 10*3/uL (ref 0.1–1.0)
Monocytes Relative: 8 %
Neutro Abs: 2.8 10*3/uL (ref 1.7–7.7)
Neutrophils Relative %: 43 %
Platelets: 365 10*3/uL (ref 150–400)
RBC: 4.43 MIL/uL (ref 3.87–5.11)
RDW: 13 % (ref 11.5–15.5)
WBC: 6.6 10*3/uL (ref 4.0–10.5)
nRBC: 0 % (ref 0.0–0.2)

## 2022-07-19 LAB — I-STAT BETA HCG BLOOD, ED (MC, WL, AP ONLY): I-stat hCG, quantitative: <5 m[IU]/mL (ref <5)

## 2022-07-19 LAB — BASIC METABOLIC PANEL
Anion gap: 11 (ref 5–15)
BUN: 12 mg/dL (ref 6–20)
CO2: 26 mmol/L (ref 22–32)
Calcium: 8.7 mg/dL — ABNORMAL LOW (ref 8.9–10.3)
Chloride: 100 mmol/L (ref 98–111)
Creatinine, Ser: 1 mg/dL (ref 0.44–1.00)
GFR, Estimated: >60 mL/min (ref >60)
Glucose, Bld: 115 mg/dL — ABNORMAL HIGH (ref 70–99)
Potassium: 3.1 mmol/L — ABNORMAL LOW (ref 3.5–5.1)
Sodium: 137 mmol/L (ref 135–145)

## 2022-07-19 LAB — TROPONIN I (HIGH SENSITIVITY)
Troponin I (High Sensitivity): 107 ng/L (ref <18)
Troponin I (High Sensitivity): 108 ng/L (ref <18)
Troponin I (High Sensitivity): 113 ng/L (ref <18)

## 2022-07-19 LAB — MAGNESIUM: Magnesium: 1.9 mg/dL (ref 1.7–2.4)

## 2022-07-19 LAB — TSH: TSH: 0.947 u[IU]/mL (ref 0.350–4.500)

## 2022-07-19 MED ORDER — ACETAMINOPHEN 650 MG RE SUPP: 650.0000 mg | Freq: Four times a day (QID) | RECTAL | Status: DC | PRN

## 2022-07-19 MED ORDER — POTASSIUM CHLORIDE CRYS ER 20 MEQ PO TBCR
40.0000 meq | EXTENDED_RELEASE_TABLET | Freq: Once | ORAL | Status: AC
  Administered 2022-07-19: 40 meq via ORAL
  Filled 2022-07-19: qty 2

## 2022-07-19 MED ORDER — LABETALOL HCL 5 MG/ML IV SOLN
10.0000 mg | Freq: Once | INTRAVENOUS | Status: AC
  Administered 2022-07-19: 10 mg via INTRAVENOUS
  Filled 2022-07-19: qty 4

## 2022-07-19 MED ORDER — MELATONIN 3 MG PO TABS
3.0000 mg | ORAL_TABLET | Freq: Every evening | ORAL | Status: DC | PRN
  Administered 2022-07-20: 3 mg via ORAL
  Filled 2022-07-19: qty 1

## 2022-07-19 MED ORDER — HEPARIN (PORCINE) 25000 UT/250ML-% IV SOLN
1300.0000 [IU]/h | INTRAVENOUS | Status: DC
  Administered 2022-07-20: 1200 [IU]/h via INTRAVENOUS
  Filled 2022-07-19: qty 250

## 2022-07-19 MED ORDER — ASPIRIN 325 MG PO TABS
325.0000 mg | ORAL_TABLET | Freq: Every day | ORAL | Status: DC
  Administered 2022-07-19 – 2022-07-20 (×2): 325 mg via ORAL
  Filled 2022-07-19 (×2): qty 1

## 2022-07-19 MED ORDER — ACETAMINOPHEN 325 MG PO TABS: 650.0000 mg | ORAL_TABLET | Freq: Four times a day (QID) | ORAL | Status: DC | PRN

## 2022-07-19 MED ORDER — HEPARIN BOLUS VIA INFUSION
4000.0000 [IU] | Freq: Once | INTRAVENOUS | Status: AC
  Administered 2022-07-20: 4000 [IU] via INTRAVENOUS
  Filled 2022-07-19: qty 4000

## 2022-07-19 NOTE — H&P (Incomplete)
History and Physical      DARIANNE MURALLES SKA:[id number] DOB: 1986/04/29 DOA: 07/19/2022  PCP: Eppie Gibson, MD *** Patient coming from: home ***  I have personally briefly reviewed patient's old medical records in [place]  Chief Complaint: ***  HPI: Terri [name] is a 37 y.o. female with medical history significant for *** who is admitted to [facility] on 07/19/2022 with *** after presenting from home*** to [facility] ED complaining of ***.    ***       ***   ED Course:  Vital signs in the ED were notable for the following: ***  Labs were notable for the following: ***  Per my interpretation, EKG in ED demonstrated the following:  ***  Imaging and additional notable ED work-up: ***  While in the ED, the following were administered: ***  Subsequently, the patient was admitted  ***  ***red    Review of Systems: As per HPI otherwise 10 point review of systems negative.   Past Medical History:  Diagnosis Date   Asthma    Chronic hypertension with superimposed severe preeclampsia 01/31/2019   Hypertension    Hypertention, malignant, with acute intensive management    Supervision of other high risk pregnancy, antepartum 01/07/2019   .[name] Nursing Staff Provider Office Location  [place] Dating  LMP Language  English Anatomy US  Incomplete but wnl, f/u wnl Flu Vaccine  Declined Genetic Screen  NIPS:   AFP:   First Screen:  Quad:   TDaP vaccine    Hgb A1C or  GTT Early  Third trimester nl 2 hour Rhogam     LAB RESULTS  Feeding Plan Breast & Bottle Blood Type O/Positive/-- (08/13 1132)  Contraception Declined Antibody Negative (08/13    History reviewed. No pertinent surgical history.  Social History:  reports that she quit smoking about 3 years ago. Her smoking use included cigarettes. She smoked an average of .2 packs per day. She has quit using smokeless tobacco.  Her smokeless tobacco use included snuff. She reports current alcohol use. She reports current  drug use. Frequency: 7.00 times per week. Drug: Marijuana.   No Known Allergies  Family History  Problem Relation Age of Onset   Hypertension Mother    Cancer Maternal Grandfather    Cancer Paternal Grandfather     Family history reviewed and not pertinent ***   Prior to Admission medications   Medication Sig Start Date End Date Taking? Authorizing Provider  amLODipine (NORVASC) 5 MG tablet Take 1 tablet (5 mg total) by mouth daily. 12/23/21 03/23/22  Lynden Oxford Scales, PA-C  amoxicillin (AMOXIL) 500 MG capsule Take 1 capsule (500 mg total) by mouth 2 (two) times daily for 10 days. 07/12/22 07/22/22  Melynda Ripple, NP  Blood Pressure Monitoring KIT 1 kit by Does not apply route once a week. 01/07/19   Shelly Bombard, MD  diclofenac Sodium (VOLTAREN) 1 % GEL Apply 2 g topically 4 (four) times daily as needed. 04/10/22   Long, Wonda Olds, MD  fluconazole (DIFLUCAN) 150 MG tablet Take 1 tablet on day 4 of antibiotics.  Take second tablet 3 days later. 12/23/21   Lynden Oxford Scales, PA-C  hydrochlorothiazide (HYDRODIURIL) 25 MG tablet Take 1 tablet (25 mg total) by mouth in the morning. 07/12/22 10/10/22  Melynda Ripple, NP  methocarbamol (ROBAXIN) 500 MG tablet Take 2 tablets (1,000 mg total) by mouth every 8 (eight) hours as needed. 05/22/22   Jim Like  B, MD  metroNIDAZOLE (FLAGYL) 500 MG tablet Take 1 tablet (500 mg total) by mouth 2 (two) times daily. 12/27/21   Lamptey, Myrene Galas, MD  naproxen (NAPROSYN) 500 MG tablet Take 500 mg by mouth 2 (two) times daily with a meal.    [provider]  hydrALAZINE (APRESOLINE) 50 MG tablet Take 1 tablet (50 mg total) by mouth every 8 (eight) hours. Patient not taking: Reported on 09/10/2019 07/26/19 09/12/19  Emily Filbert, MD  NIFEdipine (ADALAT CC) 60 MG 24 hr tablet Take 1 tablet (60 mg total) by mouth 2 (two) times daily. Patient not taking: Reported on 09/10/2019 07/26/19 09/12/19  Emily Filbert, MD     Objective    Physical  Exam: Vitals:   07/19/22 2200 07/19/22 2215 07/19/22 2230 07/19/22 2315  BP: (!) 146/102 (!) 135/101 (!) 154/106 (!) 146/131  Pulse: 94 97 92 95  Resp: '16 18 15 13  '$ Temp:      TempSrc:      SpO2: 98% 98% 98% 99%  Weight:      Height:        General: appears to be stated age; alert, oriented Skin: warm, dry, no rash Head:  AT/[place] Mouth:  Oral mucosa membranes appear moist, normal dentition Neck: supple; trachea midline Heart:  RRR; did not appreciate any M/R/G Lungs: CTAB, did not appreciate any wheezes, rales, or rhonchi Abdomen: + BS; soft, ND, NT Vascular: 2+ pedal pulses b/l; 2+ radial pulses b/l Extremities: no peripheral edema, no muscle wasting Neuro: strength and sensation intact in upper and lower extremities b/l ***   *** Neuro: 5/5 strength of the proximal and distal flexors and extensors of the upper and lower extremities bilaterally; sensation intact in upper and lower extremities b/l; cranial nerves II through XII grossly intact; no pronator drift; no evidence suggestive of slurred speech, dysarthria, or facial droop; Normal muscle tone. No tremors.  *** Neuro: In the setting of the patient's current mental status and associated inability to follow instructions, unable to perform full neurologic exam at this time.  As such, assessment of strength, sensation, and cranial nerves is limited at this time. Patient noted to spontaneously move all 4 extremities. No tremors.  ***    Labs on Admission: I have personally reviewed following labs and imaging studies  CBC: Recent Labs  Lab 07/19/22 1925  WBC 6.6  NEUTROABS 2.8  HGB 13.2  HCT 39.0  MCV 88.0  PLT 102   Basic Metabolic Panel: Recent Labs  Lab 07/19/22 1925  NA 137  K 3.1*  CL 100  CO2 26  GLUCOSE 115*  BUN 12  CREATININE 1.00  CALCIUM 8.7*  MG 1.9   GFR: Estimated Creatinine Clearance: 93.9 mL/min (by C-G formula based on SCr of 1 mg/dL). Liver Function Tests: No results for input(s):  "AST", "ALT", "ALKPHOS", "BILITOT", "PROT", "ALBUMIN" in the last 168 hours. No results for input(s): "LIPASE", "AMYLASE" in the last 168 hours. No results for input(s): "AMMONIA" in the last 168 hours. Coagulation Profile: No results for input(s): "INR", "PROTIME" in the last 168 hours. Cardiac Enzymes: No results for input(s): "CKTOTAL", "CKMB", "CKMBINDEX", "TROPONINI" in the last 168 hours. BNP (last 3 results) No results for input(s): "PROBNP" in the last 8760 hours. HbA1C: No results for input(s): "HGBA1C" in the last 72 hours. CBG: No results for input(s): "GLUCAP" in the last 168 hours. Lipid Profile: No results for input(s): "CHOL", "HDL", "LDLCALC", "TRIG", "CHOLHDL", "LDLDIRECT" in the last 72 hours. Thyroid  Function Tests: Recent Labs    07/19/22 1925  TSH 0.947   Anemia Panel: No results for input(s): "VITAMINB12", "FOLATE", "FERRITIN", "TIBC", "IRON", "RETICCTPCT" in the last 72 hours. Urine analysis:    Component Value Date/Time   COLORURINE YELLOW 04/10/2022 1010   APPEARANCEUR CLEAR 04/10/2022 1010   LABSPEC 1.020 04/10/2022 1010   PHURINE 6.5 04/10/2022 1010   GLUCOSEU NEGATIVE 04/10/2022 1010   HGBUR NEGATIVE 04/10/2022 1010   HGBUR negative 05/14/2007 1337   BILIRUBINUR NEGATIVE 04/10/2022 1010   BILIRUBINUR negative 12/23/2021 1318   BILIRUBINUR neg 09/10/2019 1628   KETONESUR NEGATIVE 04/10/2022 1010   PROTEINUR 30 (A) 04/10/2022 1010   UROBILINOGEN 0.2 12/23/2021 1318   UROBILINOGEN 0.2 08/03/2014 0830   NITRITE NEGATIVE 04/10/2022 1010   LEUKOCYTESUR NEGATIVE 04/10/2022 1010    Radiological Exams on Admission: DG Chest 2 View  Result Date: 07/19/2022 CLINICAL DATA:  Chest pain. EXAM: CHEST - 2 VIEW COMPARISON:  March 31, 2021 FINDINGS: The heart size and mediastinal contours are within normal limits. Both lungs are clear. The visualized skeletal structures are unremarkable. IMPRESSION: No active cardiopulmonary disease. Electronically Signed    By: Zetta Bills M.D.   On: 07/19/2022 17:27      Assessment/Plan   Principal Problem:   SVT (supraventricular tachycardia)   ***       ***            ***             ***            ***            ***            ***             ***           ***           ***   ***  DVT prophylaxis: SCD's ***  Code Status: Full code*** Family Communication: none*** Disposition Plan: Per Rounding Team Consults called: none***;  Admission status: ***     I SPENT GREATER THAN 75 *** MINUTES IN CLINICAL CARE TIME/MEDICAL DECISION-MAKING IN COMPLETING THIS ADMISSION.      Strum DO [facility]  From [place]   07/19/2022, 11:55 PM   ***

## 2022-07-19 NOTE — Progress Notes (Signed)
ANTICOAGULATION CONSULT NOTE - Initial Consult  Pharmacy Consult for Heparin Indication: chest pain/ACS  No Known Allergies  Patient Measurements: Height: '5\' 8"'$  (172.7 cm) Weight: 95.3 kg (210 lb) IBW/kg (Calculated) : 63.9 Heparin Dosing Weight: 84.5kg  Vital Signs: Temp: 98.7 F (37.1 C) (02/07 2045) Temp Source: Oral (02/07 2045) BP: 140/98 (02/07 2100) Pulse Rate: 91 (02/07 2100)  Labs: Recent Labs    07/19/22 1848 07/19/22 1925 07/19/22 2119  HGB  --  13.2  --   HCT  --  39.0  --   PLT  --  365  --   CREATININE  --  1.00  --   TROPONINIHS 108* 107* 113*    Estimated Creatinine Clearance: 93.9 mL/min (by C-G formula based on SCr of 1 mg/dL).   Medical History: Past Medical History:  Diagnosis Date   Asthma    Chronic hypertension with superimposed severe preeclampsia 01/31/2019   Hypertension    Hypertention, malignant, with acute intensive management    Supervision of other high risk pregnancy, antepartum 01/07/2019   .[name] Nursing Staff Provider Office Location  [place] Dating  LMP Language  English Anatomy US  Incomplete but wnl, f/u wnl Flu Vaccine  Declined Genetic Screen  NIPS:   AFP:   First Screen:  Quad:   TDaP vaccine    Hgb A1C or  GTT Early  Third trimester nl 2 hour Rhogam     LAB RESULTS  Feeding Plan Breast & Bottle Blood Type O/Positive/-- (08/13 1132)  Contraception Declined Antibody Negative (08/13    Medications:  (Not in a hospital admission)  Scheduled:   aspirin  325 mg Oral Daily   Infusions:  PRN:   Assessment: 6 yof with a history of asthma, HTN. Patient is presenting with chest pain and tachycardia. Heparin per pharmacy consult placed for chest pain/ACS.  Patient is not on anticoagulation prior to arrival.  Hgb 13.2; plt 365  Goal of Therapy:  Heparin level 0.3-0.7 units/ml Monitor platelets by anticoagulation protocol: Yes   Plan:  Give IV heparin 4000 units bolus x 1 Start heparin infusion at 1200 units/hr Check anti-Xa  level in 6 hours and daily while on heparin Continue to monitor H&H and platelets  Lorelei Pont, PharmD, BCPS 07/19/2022 10:36 PM ED Clinical Pharmacist -  [phone]

## 2022-07-19 NOTE — ED Provider Notes (Signed)
[facility] Provider Note   CSN: [id number] Arrival date & time: 07/19/22  1643     History  Chief Complaint  Patient presents with   Chest Pain   Palpitations    Terri [name] is a 37 y.o. female.  Patient presents emergency department via EMS with chief complaint of chest pain.  Patient states that this morning she went to the gym, ran on the treadmill, felt short of breath and noticed her heart rate was at approximately 150 bpm.  She felt better after resting and went home.  She states at home this afternoon she was cleaning, doing routine cleaning such as changing the sheets, and began to feel her heart racing and beating heavily.  HPI     Home Medications Prior to Admission medications   Medication Sig Start Date End Date Taking? Authorizing Provider  amLODipine (NORVASC) 5 MG tablet Take 1 tablet (5 mg total) by mouth daily. 12/23/21 03/23/22  Lynden Oxford Scales, PA-C  amoxicillin (AMOXIL) 500 MG capsule Take 1 capsule (500 mg total) by mouth 2 (two) times daily for 10 days. 07/12/22 07/22/22  Melynda Ripple, NP  Blood Pressure Monitoring KIT 1 kit by Does not apply route once a week. 01/07/19   Shelly Bombard, MD  diclofenac Sodium (VOLTAREN) 1 % GEL Apply 2 g topically 4 (four) times daily as needed. 04/10/22   Long, Wonda Olds, MD  fluconazole (DIFLUCAN) 150 MG tablet Take 1 tablet on day 4 of antibiotics.  Take second tablet 3 days later. 12/23/21   Lynden Oxford Scales, PA-C  hydrochlorothiazide (HYDRODIURIL) 25 MG tablet Take 1 tablet (25 mg total) by mouth in the morning. 07/12/22 10/10/22  Melynda Ripple, NP  methocarbamol (ROBAXIN) 500 MG tablet Take 2 tablets (1,000 mg total) by mouth every 8 (eight) hours as needed. 05/22/22   Eppie Gibson, MD  metroNIDAZOLE (FLAGYL) 500 MG tablet Take 1 tablet (500 mg total) by mouth 2 (two) times daily. 12/27/21   Lamptey, Myrene Galas, MD  naproxen (NAPROSYN) 500 MG tablet Take 500 mg by  mouth 2 (two) times daily with a meal.    [provider]  hydrALAZINE (APRESOLINE) 50 MG tablet Take 1 tablet (50 mg total) by mouth every 8 (eight) hours. Patient not taking: Reported on 09/10/2019 07/26/19 09/12/19  Emily Filbert, MD  NIFEdipine (ADALAT CC) 60 MG 24 hr tablet Take 1 tablet (60 mg total) by mouth 2 (two) times daily. Patient not taking: Reported on 09/10/2019 07/26/19 09/12/19  Emily Filbert, MD      Allergies    Patient has no known allergies.    Review of Systems   Review of Systems  Respiratory:  Positive for chest tightness and shortness of breath.   Cardiovascular:  Positive for palpitations.  Gastrointestinal:  Negative for abdominal pain, nausea and vomiting.    Physical Exam Updated Vital Signs BP (!) 146/131   Pulse 95   Temp 98.7 F (37.1 C) (Oral)   Resp 13   Ht '5\' 8"'$  (1.727 m)   Wt 95.3 kg   LMP 07/19/2022 (Approximate)   SpO2 99%   BMI 31.93 kg/m  Physical Exam Vitals and nursing note reviewed.  Constitutional:      General: She is not in acute distress.    Appearance: She is well-developed.  HENT:     Head: Normocephalic and atraumatic.  Eyes:     Conjunctiva/sclera: Conjunctivae normal.  Cardiovascular:  Rate and Rhythm: Normal rate and regular rhythm.     Heart sounds: No murmur heard. Pulmonary:     Effort: Pulmonary effort is normal. No respiratory distress.     Breath sounds: Normal breath sounds.  Chest:     Chest wall: No tenderness.  Abdominal:     Palpations: Abdomen is soft.     Tenderness: There is no abdominal tenderness.  Musculoskeletal:        General: No swelling.     Cervical back: Neck supple.  Skin:    General: Skin is warm and dry.     Capillary Refill: Capillary refill takes less than 2 seconds.  Neurological:     Mental Status: She is alert.  Psychiatric:        Mood and Affect: Mood normal.     ED Results / Procedures / Treatments   Labs (all labs ordered are listed, but only abnormal results  are displayed) Labs Reviewed  BASIC METABOLIC PANEL - Abnormal; Notable for the following components:      Result Value   Potassium 3.1 (*)    Glucose, Bld 115 (*)    Calcium 8.7 (*)    All other components within normal limits  TROPONIN I (HIGH SENSITIVITY) - Abnormal; Notable for the following components:   Troponin I (High Sensitivity) 108 (*)    All other components within normal limits  TROPONIN I (HIGH SENSITIVITY) - Abnormal; Notable for the following components:   Troponin I (High Sensitivity) 107 (*)    All other components within normal limits  TROPONIN I (HIGH SENSITIVITY) - Abnormal; Notable for the following components:   Troponin I (High Sensitivity) 113 (*)    All other components within normal limits  CBC WITH DIFFERENTIAL/PLATELET  MAGNESIUM  TSH  CBC WITH DIFFERENTIAL/PLATELET  HEPARIN LEVEL (UNFRACTIONATED)  I-STAT BETA HCG BLOOD, ED ([facility], [facility], [facility] ONLY)    EKG EKG Interpretation  Date/Time:  Wednesday July 19 2022 17:01:48 EST Ventricular Rate:  112 PR Interval:  181 QRS Duration: 143 QT Interval:  353 QTC Calculation: 482 R Axis:   60 Text Interpretation: Sinus tachycardia Right atrial enlargement Right bundle branch block Inferior infarct, acute No significant change since last tracing Confirmed by Gareth Morgan [phone]) on 07/19/2022 5:49:18 PM  Radiology DG Chest 2 View  Result Date: 07/19/2022 CLINICAL DATA:  Chest pain. EXAM: CHEST - 2 VIEW COMPARISON:  March 31, 2021 FINDINGS: The heart size and mediastinal contours are within normal limits. Both lungs are clear. The visualized skeletal structures are unremarkable. IMPRESSION: No active cardiopulmonary disease. Electronically Signed   By: Zetta Bills M.D.   On: 07/19/2022 17:27    Procedures Procedures    Medications Ordered in ED Medications  aspirin tablet 325 mg (325 mg Oral Given 07/19/22 2026)  heparin ADULT infusion 100 units/mL (25000 units/211m) (has no administration in time  range)  heparin bolus via infusion 4,000 Units (has no administration in time range)  labetalol (NORMODYNE) injection 10 mg (10 mg Intravenous Given 07/19/22 2020)  potassium chloride SA (KLOR-CON M) CR tablet 40 mEq (40 mEq Oral Given 07/19/22 2219)    ED Course/ Medical Decision Making/ A&P                             Medical Decision Making Amount and/or Complexity of Data Reviewed Labs: ordered. Radiology: ordered.  Risk OTC drugs. Prescription drug management.   This patient presents to the ED  for concern of chest pain and tachycardia, this involves an extensive number of treatment options, and is a complaint that carries with it a high risk of complications and morbidity.  The differential diagnosis includes ACS, pulmonary embolism, SVT, A-fib with RVR, sinus tachycardia, others   Co morbidities that complicate the patient evaluation  Asthma, hypertension   Additional history obtained:  Additional history obtained from family bedside, EMS External records from outside source obtained and reviewed including urgent care notes from January 31 showing positive strep throat test, prescribed amoxicillin   Lab Tests:  I Ordered, and personally interpreted labs.  The pertinent results include: Potassium 3.1, initial troponin 108, repeat 113; Magnesium 1.9, negative i-STAT beta hCG pregnancy test   Imaging Studies ordered:  I ordered imaging studies including chest x-ray I independently visualized and interpreted imaging which showed no active disease I agree with the radiologist interpretation   Cardiac Monitoring: / EKG:  The patient was maintained on a cardiac monitor.  I personally viewed and interpreted the cardiac monitored which showed an underlying rhythm of: Sinus tachycardia, repeat showing normal sinus rhythm   Consultations Obtained:  I requested consultation with the hospitalist, Dr. Velia Meyer and discussed lab and imaging findings as well as pertinent plan -  they recommend: Admission to the hospital [place], attending plans to discuss case with cardiology   Problem List / ED Course / Critical interventions / Medication management   I ordered medication including aspirin, heparin for chest pain/elevated troponin; potassium for hypokalemia; labetalol for hypertension Reevaluation of the patient after these medicines showed that the patient improved I have reviewed the patients home medicines and have made adjustments as needed   Social Determinants of Health:  Patient has medicaid as her primary insurance   Test / Admission - Considered:  Patient needs admission for further evaluation and management of her chest tightness, elevated troponins.          Final Clinical Impression(s) / ED Diagnoses Final diagnoses:  Chest pain, unspecified type  SVT (supraventricular tachycardia)    Rx / DC Orders ED Discharge Orders     None         Ronny Bacon 07/19/22 2351    Gareth Morgan, MD 07/20/22 [phone]

## 2022-07-19 NOTE — ED Triage Notes (Addendum)
Pt was [facility] for chest pain. Pt was cleaning and then felt heart racing and beating. Pt for EMS was originally 240 bpm. EMS gave '6mg'$  Adneosine and then 20 mg of cardizem in route. Pt after SVT went tachy in the low 100s then raised again to 170s, then after cardizem is back to the 80s irregular. Pt is alert and oriented speaking in full sentences   NO hx of afib  BP 150/100

## 2022-07-20 ENCOUNTER — Encounter (HOSPITAL_COMMUNITY): Payer: Self-pay | Admitting: Internal Medicine

## 2022-07-20 ENCOUNTER — Observation Stay (HOSPITAL_BASED_OUTPATIENT_CLINIC_OR_DEPARTMENT_OTHER): Payer: Medicaid Other

## 2022-07-20 ENCOUNTER — Other Ambulatory Visit (HOSPITAL_COMMUNITY): Payer: Self-pay

## 2022-07-20 DIAGNOSIS — I10 Essential (primary) hypertension: Secondary | ICD-10-CM | POA: Diagnosis not present

## 2022-07-20 DIAGNOSIS — I471 Supraventricular tachycardia, unspecified: Secondary | ICD-10-CM | POA: Diagnosis not present

## 2022-07-20 DIAGNOSIS — Z87891 Personal history of nicotine dependence: Secondary | ICD-10-CM | POA: Diagnosis not present

## 2022-07-20 DIAGNOSIS — E669 Obesity, unspecified: Secondary | ICD-10-CM | POA: Diagnosis not present

## 2022-07-20 DIAGNOSIS — R7989 Other specified abnormal findings of blood chemistry: Secondary | ICD-10-CM | POA: Diagnosis present

## 2022-07-20 DIAGNOSIS — Z79899 Other long term (current) drug therapy: Secondary | ICD-10-CM | POA: Diagnosis not present

## 2022-07-20 DIAGNOSIS — R079 Chest pain, unspecified: Secondary | ICD-10-CM | POA: Diagnosis not present

## 2022-07-20 DIAGNOSIS — R0789 Other chest pain: Secondary | ICD-10-CM | POA: Diagnosis present

## 2022-07-20 DIAGNOSIS — E876 Hypokalemia: Secondary | ICD-10-CM | POA: Diagnosis present

## 2022-07-20 DIAGNOSIS — J45909 Unspecified asthma, uncomplicated: Secondary | ICD-10-CM | POA: Diagnosis not present

## 2022-07-20 DIAGNOSIS — Z6839 Body mass index (BMI) 39.0-39.9, adult: Secondary | ICD-10-CM | POA: Diagnosis not present

## 2022-07-20 DIAGNOSIS — R0602 Shortness of breath: Secondary | ICD-10-CM | POA: Diagnosis not present

## 2022-07-20 LAB — URINALYSIS, COMPLETE (UACMP) WITH MICROSCOPIC
Bacteria, UA: NONE SEEN
Bilirubin Urine: NEGATIVE
Glucose, UA: NEGATIVE mg/dL
Ketones, ur: NEGATIVE mg/dL
Leukocytes,Ua: NEGATIVE
Nitrite: NEGATIVE
Protein, ur: NEGATIVE mg/dL
Specific Gravity, Urine: 1.024 (ref 1.005–1.030)
pH: 5 (ref 5.0–8.0)

## 2022-07-20 LAB — RAPID URINE DRUG SCREEN, HOSP PERFORMED
Amphetamines: NOT DETECTED
Barbiturates: NOT DETECTED
Benzodiazepines: NOT DETECTED
Cocaine: NOT DETECTED
Opiates: NOT DETECTED
Tetrahydrocannabinol: POSITIVE — AB

## 2022-07-20 LAB — CBC WITH DIFFERENTIAL/PLATELET
Abs Immature Granulocytes: 0.01 10*3/uL (ref 0.00–0.07)
Basophils Absolute: 0 10*3/uL (ref 0.0–0.1)
Basophils Relative: 0 %
Eosinophils Absolute: 0.2 10*3/uL (ref 0.0–0.5)
Eosinophils Relative: 3 %
HCT: 37.8 % (ref 36.0–46.0)
Hemoglobin: 12.6 g/dL (ref 12.0–15.0)
Immature Granulocytes: 0 %
Lymphocytes Relative: 54 %
Lymphs Abs: 3.6 10*3/uL (ref 0.7–4.0)
MCH: 29.5 pg (ref 26.0–34.0)
MCHC: 33.3 g/dL (ref 30.0–36.0)
MCV: 88.5 fL (ref 80.0–100.0)
Monocytes Absolute: 0.5 10*3/uL (ref 0.1–1.0)
Monocytes Relative: 7 %
Neutro Abs: 2.5 10*3/uL (ref 1.7–7.7)
Neutrophils Relative %: 36 %
Platelets: 365 10*3/uL (ref 150–400)
RBC: 4.27 MIL/uL (ref 3.87–5.11)
RDW: 13.2 % (ref 11.5–15.5)
WBC: 6.8 10*3/uL (ref 4.0–10.5)
nRBC: 0 % (ref 0.0–0.2)

## 2022-07-20 LAB — TROPONIN I (HIGH SENSITIVITY): Troponin I (High Sensitivity): 64 ng/L — ABNORMAL HIGH (ref <18)

## 2022-07-20 LAB — COMPREHENSIVE METABOLIC PANEL
ALT: 13 U/L (ref 0–44)
AST: 17 U/L (ref 15–41)
Albumin: 3.5 g/dL (ref 3.5–5.0)
Alkaline Phosphatase: 66 U/L (ref 38–126)
Anion gap: 11 (ref 5–15)
BUN: 12 mg/dL (ref 6–20)
CO2: 25 mmol/L (ref 22–32)
Calcium: 8.7 mg/dL — ABNORMAL LOW (ref 8.9–10.3)
Chloride: 101 mmol/L (ref 98–111)
Creatinine, Ser: 0.93 mg/dL (ref 0.44–1.00)
GFR, Estimated: >60 mL/min (ref >60)
Glucose, Bld: 123 mg/dL — ABNORMAL HIGH (ref 70–99)
Potassium: 3.3 mmol/L — ABNORMAL LOW (ref 3.5–5.1)
Sodium: 137 mmol/L (ref 135–145)
Total Bilirubin: 0.5 mg/dL (ref 0.3–1.2)
Total Protein: 7 g/dL (ref 6.5–8.1)

## 2022-07-20 LAB — ECHOCARDIOGRAM COMPLETE
Area-P 1/2: 2.63 cm2
Height: 68 in
S' Lateral: 2.4 cm
Weight: 4208 oz

## 2022-07-20 LAB — LIPASE, BLOOD: Lipase: 35 U/L (ref 11–51)

## 2022-07-20 LAB — BRAIN NATRIURETIC PEPTIDE: B Natriuretic Peptide: 87.7 pg/mL (ref 0.0–100.0)

## 2022-07-20 LAB — SEDIMENTATION RATE: Sed Rate: 25 mm/hr — ABNORMAL HIGH (ref 0–22)

## 2022-07-20 LAB — MAGNESIUM: Magnesium: 2.3 mg/dL (ref 1.7–2.4)

## 2022-07-20 LAB — HEPARIN LEVEL (UNFRACTIONATED): Heparin Unfractionated: 0.27 IU/mL — ABNORMAL LOW (ref 0.30–0.70)

## 2022-07-20 LAB — C-REACTIVE PROTEIN: CRP: 1.4 mg/dL — ABNORMAL HIGH (ref <1.0)

## 2022-07-20 MED ORDER — METOPROLOL TARTRATE 25 MG PO TABS
25.0000 mg | ORAL_TABLET | Freq: Two times a day (BID) | ORAL | Status: DC
  Administered 2022-07-20: 25 mg via ORAL
  Filled 2022-07-20: qty 1

## 2022-07-20 MED ORDER — AMLODIPINE BESYLATE 5 MG PO TABS: 5.0000 mg | ORAL_TABLET | Freq: Every day | ORAL | Status: DC

## 2022-07-20 MED ORDER — BLOOD PRESSURE KIT: PACK | 0 refills | Status: AC

## 2022-07-20 MED ORDER — LOSARTAN POTASSIUM 25 MG PO TABS: 25.0000 mg | ORAL_TABLET | Freq: Every day | ORAL | Status: DC

## 2022-07-20 MED ORDER — POTASSIUM CHLORIDE CRYS ER 20 MEQ PO TBCR
40.0000 meq | EXTENDED_RELEASE_TABLET | ORAL | Status: AC
  Administered 2022-07-20 (×2): 40 meq via ORAL
  Filled 2022-07-20 (×2): qty 2

## 2022-07-20 MED ORDER — MAGNESIUM SULFATE 2 GM/50ML IV SOLN
2.0000 g | Freq: Once | INTRAVENOUS | Status: AC
  Administered 2022-07-20: 2 g via INTRAVENOUS
  Filled 2022-07-20: qty 50

## 2022-07-20 MED ORDER — AMLODIPINE BESYLATE 5 MG PO TABS
5.0000 mg | ORAL_TABLET | Freq: Every day | ORAL | 1 refills | Status: DC
  Filled 2022-07-20: qty 30, 30d supply, fill #0

## 2022-07-20 MED ORDER — METOPROLOL TARTRATE 25 MG PO TABS
25.0000 mg | ORAL_TABLET | Freq: Two times a day (BID) | ORAL | 1 refills | Status: DC
  Filled 2022-07-20: qty 60, 30d supply, fill #0

## 2022-07-20 NOTE — Progress Notes (Signed)
ANTICOAGULATION CONSULT NOTE   Pharmacy Consult for Heparin Indication: chest pain/ACS  No Known Allergies  Patient Measurements: Height: '5\' 8"'$  (172.7 cm) Weight: 119.3 kg (263 lb) IBW/kg (Calculated) : 63.9 Heparin Dosing Weight: 84.5kg  Vital Signs: Temp: 98.7 F (37.1 C) (02/08 0120) Temp Source: Oral (02/08 0120) BP: 143/86 (02/08 0120) Pulse Rate: 96 (02/08 0120)  Labs: Recent Labs    07/19/22 1925 07/19/22 2119 07/20/22 0443  HGB 13.2  --  12.6  HCT 39.0  --  37.8  PLT 365  --  365  HEPARINUNFRC  --   --  0.27*  CREATININE 1.00  --  0.93  TROPONINIHS 107* 113* 64*     Estimated Creatinine Clearance: 113.7 mL/min (by C-G formula based on SCr of 0.93 mg/dL).   Medical History: Past Medical History:  Diagnosis Date   Asthma    Chronic hypertension with superimposed severe preeclampsia 01/31/2019   Hypertension    Hypertention, malignant, with acute intensive management    Supervision of other high risk pregnancy, antepartum 01/07/2019   .[name] Nursing Staff Provider Office Location  [facility] Dating  LMP Language  English Anatomy US  Incomplete but wnl, f/u wnl Flu Vaccine  Declined Genetic Screen  NIPS:   AFP:   First Screen:  Quad:   TDaP vaccine    Hgb A1C or  GTT Early  Third trimester nl 2 hour Rhogam     LAB RESULTS  Feeding Plan Breast & Bottle Blood Type O/Positive/-- (08/13 1132)  Contraception Declined Antibody Negative (08/13    Medications:  Medications Prior to Admission  Medication Sig Dispense Refill Last Dose   amLODipine (NORVASC) 5 MG tablet Take 1 tablet (5 mg total) by mouth daily. 30 tablet 2    amoxicillin (AMOXIL) 500 MG capsule Take 1 capsule (500 mg total) by mouth 2 (two) times daily for 10 days. 20 capsule 0    Blood Pressure Monitoring KIT 1 kit by Does not apply route once a week. 1 kit 0    diclofenac Sodium (VOLTAREN) 1 % GEL Apply 2 g topically 4 (four) times daily as needed. 100 g 0    fluconazole (DIFLUCAN) 150 MG tablet Take 1  tablet on day 4 of antibiotics.  Take second tablet 3 days later. 2 tablet 0    hydrochlorothiazide (HYDRODIURIL) 25 MG tablet Take 1 tablet (25 mg total) by mouth in the morning. 30 tablet 2    methocarbamol (ROBAXIN) 500 MG tablet Take 2 tablets (1,000 mg total) by mouth every 8 (eight) hours as needed. 60 tablet 0    metroNIDAZOLE (FLAGYL) 500 MG tablet Take 1 tablet (500 mg total) by mouth 2 (two) times daily. 14 tablet 0    naproxen (NAPROSYN) 500 MG tablet Take 500 mg by mouth 2 (two) times daily with a meal.       Scheduled:   aspirin  325 mg Oral Daily   Infusions:   heparin 1,200 Units/hr (07/20/22 0043)   PRN:   Assessment: 48 yof with a history of asthma, HTN. Patient is presenting with chest pain and tachycardia. Heparin per pharmacy consult placed for chest pain/ACS.  Patient is not on anticoagulation prior to arrival.  Hgb 13.2; plt 365  2/8 AM update:  Heparin level just below goal  Goal of Therapy:  Heparin level 0.3-0.7 units/ml Monitor platelets by anticoagulation protocol: Yes   Plan:  Inc heparin to 1300 units/hr 1400 heparin level  Narda Bonds, PharmD, BCPS Clinical Pharmacist Phone: [phone]

## 2022-07-20 NOTE — Plan of Care (Signed)

## 2022-07-20 NOTE — Discharge Summary (Signed)
Physician Discharge Summary  Terri [name] VPX:[id number] DOB: [birth year] DOA: 07/19/2022  PCP: Eppie Gibson, MD  Admit date: 07/19/2022 Discharge date: 07/20/2022  Time spent: 40 minutes  Recommendations for Outpatient Follow-up:  Follow outpatient CBC/CMP  Follow blood pressure outpatient, adjust meds as needed Follow outpatient sleep study   Discharge Diagnoses:  Principal Problem:   SVT (supraventricular tachycardia) Active Problems:   Essential hypertension   Atypical chest pain   Elevated troponin   Hypokalemia   Discharge Condition: stable  Diet recommendation: heart healthy  Filed Weights   07/19/22 1649 07/20/22 0113  Weight: 95.3 kg 119.3 kg    History of present illness:  Terri [name] is Terri [name] 37 y.o. female with medical history significant for essential hypertension who is admitted to [facility] on 07/19/2022 with new onset SVT after presenting from home to [facility] ED complaining of chest tightness.    She's improved on hospital day 1, started on metoprolol and seen by cardiology.  Planning for outpatient cards follow up.  Hospital Course:  Assessment and Plan:  Supraventricular Tachycardia  Chest Discomfort  Elevated Troponin Resolved after adenosine, then cardizem Started on metoprolol here, will continue at discharge Suspect tachycardia mediated elevation of troponin, appreciate cardiology assistance Echo with EF 55-60%, no RWMA, grade II diastolic dysfunction, small pericardial effusion Lytes replaced prior to d/c Follow outpatient with cards  Hypertension Discharge on amlodipine and metoprolol Attention to appropriateness of meds given childbearing age  Concern for sleep apnea Needs outpatient sleep study  Obesity Body mass index is 39.99 kg/m.    Procedures: Echo IMPRESSIONS     1. Left ventricular ejection fraction, by estimation, is 55 to 60%. Left  ventricular ejection fraction by 3D volume is 52 %. The left ventricle has   normal function. The left ventricle has no regional wall motion  abnormalities. There is moderate  concentric left ventricular hypertrophy of the basal-septal segment. Left  ventricular diastolic parameters are consistent with Grade II diastolic  dysfunction (pseudonormalization). The average left ventricular global  longitudinal strain is -15.7 %. The  global longitudinal strain is abnormal.   2. Right ventricular systolic function is normal. The right ventricular  size is normal.   3. Mahogani Holohan small pericardial effusion is present. The pericardial effusion is  anterior to the right ventricle.   4. The mitral valve is normal in structure. No evidence of mitral valve  regurgitation. No evidence of mitral stenosis.   5. The aortic valve is tricuspid. Aortic valve regurgitation is not  visualized. No aortic stenosis is present.   6. The inferior vena cava is normal in size with greater than 50%  respiratory variability, suggesting right atrial pressure of 3 mmHg.   Comparison(s): No prior Echocardiogram.    Consultations: cardiology  Discharge Exam: Vitals:   07/20/22 1028 07/20/22 1146  BP: (!) 152/108 (!) 133/99  Pulse: 97 90  Resp:  19  Temp:  98.3 F (36.8 C)  SpO2:  99%   No complaints Feels just about back to herself  General: No acute distress. Cardiovascular: RRR Lungs: unlabored Abdomen: Soft, nontender, nondistended Neurological: Alert and oriented 3. Moves all extremities 4 with equal strength. Cranial nerves II through XII grossly intact. Extremities: No clubbing or cyanosis. No edema  Discharge Instructions   Discharge Instructions     Call MD for:  difficulty breathing, headache or visual disturbances   Complete by: As directed    Call MD for:  extreme fatigue   Complete by:  As directed    Call MD for:  hives   Complete by: As directed    Call MD for:  persistant dizziness or light-headedness   Complete by: As directed    Call MD for:  persistant  nausea and vomiting   Complete by: As directed    Call MD for:  redness, tenderness, or signs of infection (pain, swelling, redness, odor or green/yellow discharge around incision site)   Complete by: As directed    Call MD for:  severe uncontrolled pain   Complete by: As directed    Call MD for:  temperature >100.4   Complete by: As directed    Diet - low sodium heart healthy   Complete by: As directed    Discharge instructions   Complete by: As directed    You were seen for an abnormal heart rhythm (SVT).  You've now improved after treatment and have been started on metoprolol.  We'll restart you on amlodipine.  Stop your HCTZ for now.    Your echocardiogram (ultrasound of the heart) showed findings suggestive of the effects of longstanding high blood pressure.  It showed diastolic dysfunction and left ventricular hypertrophy.  Blood pressure control will be extremely important over the long term.  Cardiology will follow up with you regarding the fast heart rate and whether you need additional workup.  They'll also follow up with you with regards to the abnormal heart enzymes to determine if you need further workup from that standpoint.  We'll arrange cardiology follow up for your as an outpatient.  Return for new, recurrent, or worsening symptoms.  Please ask your PCP to request records from this hospitalization so they know what was done and what the next steps will be.   Increase activity slowly   Complete by: As directed       Allergies as of 07/20/2022   No Known Allergies      Medication List     STOP taking these medications    hydrochlorothiazide 25 MG tablet Commonly known as: HYDRODIURIL       TAKE these medications    amLODipine 5 MG tablet Commonly known as: NORVASC Take 1 tablet (5 mg total) by mouth daily.   amoxicillin 500 MG capsule Commonly known as: AMOXIL Take 1 capsule (500 mg total) by mouth 2 (two) times daily for 10 days.   metoprolol  tartrate 25 MG tablet Commonly known as: LOPRESSOR Take 1 tablet (25 mg total) by mouth 2 (two) times daily.       No Known Allergies  Follow-up Information     Eppie Gibson, MD Follow up.   Specialty: Family Medicine Contact information: [place] [address] [phone]         Lenna Sciara, NP Follow up.   Specialties: Cardiology, Family Medicine Why: February 15th at 10:05 AM, you have Matthew Pais follow up appointment with cardiology Contact information: [address] [place] [address] [phone]                  The results of significant diagnostics from this hospitalization (including imaging, microbiology, ancillary and laboratory) are listed below for reference.    Significant Diagnostic Studies: ECHOCARDIOGRAM COMPLETE  Result Date: 07/20/2022    ECHOCARDIOGRAM REPORT   Patient Name:   CHARLYNN SALIH Date of Exam: 07/20/2022 Medical Rec #:  [id number]      Height:       68.0 in Accession #:  [id number]     Weight:       263.0 lb Date of Birth:  [birth year]      BSA:          2.296 m Patient Age:    36 years       BP:           143/86 mmHg Patient Gender: F              HR:           96 bpm. Exam Location:  Inpatient Procedure: 2D Echo, 3D Echo, Cardiac Doppler, Color Doppler and Strain Analysis Indications:    Chest Pain R07.9  History:        Patient has no prior history of Echocardiogram examinations.                 Arrythmias:Tachycardia; Risk Factors:Hypertension.  Sonographer:    Darlina Sicilian RDCS Referring Phys: [id number] [place]  1. Left ventricular ejection fraction, by estimation, is 55 to 60%. Left ventricular ejection fraction by 3D volume is 52 %. The left ventricle has normal function. The left ventricle has no regional wall motion abnormalities. There is moderate concentric left ventricular hypertrophy of the basal-septal segment. Left ventricular diastolic parameters are consistent with Grade II  diastolic dysfunction (pseudonormalization). The average left ventricular global longitudinal strain is -15.7 %. The global longitudinal strain is abnormal.  2. Right ventricular systolic function is normal. The right ventricular size is normal.  3. Davonna Ertl small pericardial effusion is present. The pericardial effusion is anterior to the right ventricle.  4. The mitral valve is normal in structure. No evidence of mitral valve regurgitation. No evidence of mitral stenosis.  5. The aortic valve is tricuspid. Aortic valve regurgitation is not visualized. No aortic stenosis is present.  6. The inferior vena cava is normal in size with greater than 50% respiratory variability, suggesting right atrial pressure of 3 mmHg. Comparison(s): No prior Echocardiogram. FINDINGS  Left Ventricle: Left ventricular ejection fraction, by estimation, is 55 to 60%. Left ventricular ejection fraction by 3D volume is 52 %. The left ventricle has normal function. The left ventricle has no regional wall motion abnormalities. The average left ventricular global longitudinal strain is -15.7 %. The global longitudinal strain is abnormal. The left ventricular internal cavity size was normal in size. There is moderate concentric left ventricular hypertrophy of the basal-septal segment. Left ventricular diastolic parameters are consistent with Grade II diastolic dysfunction (pseudonormalization). Right Ventricle: The right ventricular size is normal. Right ventricular systolic function is normal. Left Atrium: Left atrial size was normal in size. Right Atrium: Right atrial size was normal in size. Pericardium: Reese Senk small pericardial effusion is present. The pericardial effusion is anterior to the right ventricle. Mitral Valve: The mitral valve is normal in structure. No evidence of mitral valve regurgitation. No evidence of mitral valve stenosis. Tricuspid Valve: The tricuspid valve is grossly normal. Tricuspid valve regurgitation is not demonstrated. No  evidence of tricuspid stenosis. Aortic Valve: The aortic valve is tricuspid. Aortic valve regurgitation is not visualized. No aortic stenosis is present. Pulmonic Valve: The pulmonic valve was grossly normal. Pulmonic valve regurgitation is not visualized. No evidence of pulmonic stenosis. Aorta: The aortic root and ascending aorta are structurally normal, with no evidence of dilitation. Venous: The inferior vena cava is normal in size with greater than 50% respiratory variability, suggesting right atrial pressure of 3 mmHg. IAS/Shunts: No atrial level shunt detected by color flow Doppler.  LEFT VENTRICLE PLAX 2D LVIDd:         4.00 cm         Diastology LVIDs:         2.40 cm         LV e' medial:    6.99 cm/s LV PW:         1.10 cm         LV E/e' medial:  7.1 LV IVS:        1.40 cm         LV e' lateral:   7.62 cm/s LVOT diam:     2.20 cm         LV E/e' lateral: 6.5 LV SV:         76 LV SV Index:   33              2D LVOT Area:     3.80 cm        Longitudinal                                Strain                                2D Strain GLS  -15.7 %                                Avg:                                 3D Volume EF                                LV 3D EF:    Left                                             ventricul                                             ar                                             ejection                                             fraction                                             by 3D  volume is                                             52 %.                                 3D Volume EF:                                3D EF:        52 %                                LV EDV:       146 ml                                LV ESV:       70 ml                                LV SV:        76 ml RIGHT VENTRICLE RV S prime:     11.90 cm/s TAPSE (M-mode): 2.5 cm LEFT ATRIUM             Index        RIGHT ATRIUM          Index LA  diam:        3.00 cm 1.31 cm/m   RA Area:     9.66 cm LA Vol (A2C):   44.7 ml 19.47 ml/m  RA Volume:   18.20 ml 7.93 ml/m LA Vol (A4C):   39.8 ml 17.33 ml/m LA Biplane Vol: 42.2 ml 18.38 ml/m  AORTIC VALVE LVOT Vmax:   114.00 cm/s LVOT Vmean:  78.700 cm/s LVOT VTI:    0.199 m  AORTA Ao Root diam: 3.30 cm Ao Asc diam:  3.10 cm MITRAL VALVE MV Area (PHT): 2.63 cm    SHUNTS MV Decel Time: 288 msec    Systemic VTI:  0.20 m MV E velocity: 49.70 cm/s  Systemic Diam: 2.20 cm MV Deshannon Seide velocity: 45.80 cm/s MV E/Journii Nierman ratio:  1.09 Kirk Ruths MD Electronically signed by Kirk Ruths MD Signature Date/Time: 07/20/2022/12:05:18 PM    Final    DG Chest 2 View  Result Date: 07/19/2022 CLINICAL DATA:  Chest pain. EXAM: CHEST - 2 VIEW COMPARISON:  March 31, 2021 FINDINGS: The heart size and mediastinal contours are within normal limits. Both lungs are clear. The visualized skeletal structures are unremarkable. IMPRESSION: No active cardiopulmonary disease. Electronically Signed   By: Zetta Bills M.D.   On: 07/19/2022 17:27    Microbiology: No results found for this or any previous visit (from the past 240 hour(s)).   Labs: Basic Metabolic Panel: Recent Labs  Lab 07/19/22 1925 07/20/22 0443  NA 137 137  K 3.1* 3.3*  CL 100 101  CO2 26 25  GLUCOSE 115* 123*  BUN 12 12  CREATININE 1.00 0.93  CALCIUM 8.7* 8.7*  MG 1.9 2.3   Liver Function Tests: Recent Labs  Lab 07/20/22 0443  AST 17  ALT 13  ALKPHOS 66  BILITOT 0.5  PROT 7.0  ALBUMIN 3.5   Recent Labs  Lab 07/20/22 0443  LIPASE 35   No results for input(s): "AMMONIA" in the last 168 hours. CBC: Recent Labs  Lab 07/19/22 1925 07/20/22 0443  WBC 6.6 6.8  NEUTROABS 2.8 2.5  HGB 13.2 12.6  HCT 39.0 37.8  MCV 88.0 88.5  PLT 365 365   Cardiac Enzymes: No results for input(s): "CKTOTAL", "CKMB", "CKMBINDEX", "TROPONINI" in the last 168 hours. BNP: BNP (last 3 results) Recent Labs    07/20/22 0443  BNP 87.7    ProBNP (last  3 results) No results for input(s): "PROBNP" in the last 8760 hours.  CBG: No results for input(s): "GLUCAP" in the last 168 hours.     Signed:  Fayrene Helper MD.  [facility] 07/20/2022, 3:04 PM

## 2022-07-20 NOTE — Consult Note (Signed)
Cardiology Consultation   Patient ID: MONE COMMISSO MRN: [id number]; DOB: 03/31/1986  Admit date: 07/19/2022 Date of Consult: 07/20/2022  PCP:  Eppie Gibson, MD   [place] Providers Cardiologist:  None        Patient Profile:   Terri [name] is a 37 y.o. female with a hx of HTN who is being seen 07/20/2022 for the evaluation of SVT at the request of Dr. Velia Meyer  History of Present Illness:   Terri [name] is a 37 y.o. female with a hx of HTN who is being seen 07/20/2022 for the evaluation of SVT at the request of Dr. Velia Meyer. She was doing fine till 3 pm today when she started having palpitations. noticed her heart rate was at approximately 150 bpm. She felt SOB. EMS was called. Adenosine and Cardizem was given which converted her SVT in to NSR with HR in 80s. She felt better but her chest heaviness persisted. In the ED, troponin was low 100s; no significant delta. K was 3.1 she takes HCTZ for HTN. Denies any palpitation currently.    Past Medical History:  Diagnosis Date   Asthma    Chronic hypertension with superimposed severe preeclampsia 01/31/2019   Hypertension    Hypertention, malignant, with acute intensive management    Supervision of other high risk pregnancy, antepartum 01/07/2019   .[name] Nursing Staff Provider Office Location  [facility] Dating  LMP Language  English Anatomy US  Incomplete but wnl, f/u wnl Flu Vaccine  Declined Genetic Screen  NIPS:   AFP:   First Screen:  Quad:   TDaP vaccine    Hgb A1C or  GTT Early  Third trimester nl 2 hour Rhogam     LAB RESULTS  Feeding Plan Breast & Bottle Blood Type O/Positive/-- (08/13 1132)  Contraception Declined Antibody Negative (08/13    History reviewed. No pertinent surgical history.     Inpatient Medications: Scheduled Meds:  aspirin  325 mg Oral Daily   Continuous Infusions:  heparin 1,200 Units/hr (07/20/22 0039)   magnesium sulfate bolus IVPB 2 g (07/20/22 0133)   PRN Meds: acetaminophen **OR**  acetaminophen, melatonin  Allergies:   No Known Allergies  Social History:   Social History   Socioeconomic History   Marital status: Single    Spouse name: Not on file   Number of children: Not on file   Years of education: Not on file   Highest education level: Not on file  Occupational History   Occupation: Unemployed  Tobacco Use   Smoking status: Former    Packs/day: 0.20    Types: Cigarettes    Quit date: 11/24/2018    Years since quitting: 3.6   Smokeless tobacco: Former    Types: Snuff  Vaping Use   Vaping Use: Never used  Substance and Sexual Activity   Alcohol use: Yes    Comment: drinks liquor   Drug use: Yes    Frequency: 7.0 times per week    Types: Marijuana    Comment: daily   Sexual activity: Yes    Birth control/protection: None  Other Topics Concern   Not on file  Social History Narrative   ** Merged History Encounter **       Social Determinants of Health   Financial Resource Strain: Not on file  Food Insecurity: Not on file  Transportation Needs: Not on file  Physical Activity: Not on file  Stress: Not on file  Social Connections: Not on file  Intimate  Partner Violence: Not At Risk (01/23/2019)   Humiliation, Afraid, Rape, and Kick questionnaire    Fear of Current or Ex-Partner: No    Emotionally Abused: No    Physically Abused: No    Sexually Abused: No    Family History:   Family History  Problem Relation Age of Onset   Hypertension Mother    Cancer Maternal Grandfather    Cancer Paternal Grandfather      ROS:  Please see the history of present illness.  All other ROS reviewed and negative.     Physical Exam/Data:   Vitals:   07/20/22 0015 07/20/22 0030 07/20/22 0113 07/20/22 0120  BP: 125/77 (!) 139/108  (!) 143/86  Pulse: 91 92  96  Resp: '16 14  15  '$ Temp:    98.7 F (37.1 C)  TempSrc:    Oral  SpO2: 99% 97%  100%  Weight:   119.3 kg   Height:   '5\' 8"'$  (1.727 m)    No intake or output data in the 24 hours ending  07/20/22 0137    07/20/2022    1:13 AM 07/19/2022    4:49 PM 07/12/2022   12:56 PM  Last 3 Weights  Weight (lbs) 263 lb 210 lb 220 lb  Weight (kg) 119.296 kg 95.255 kg 99.791 kg     Body mass index is 39.99 kg/m.  General:  Well nourished, well developed, in no acute distress HEENT: normal Neck: no JVD Vascular: No carotid bruits; Distal pulses 2+ bilaterally Cardiac:  normal S1, S2; RRR; no murmur  Lungs:  clear to auscultation bilaterally, no wheezing, rhonchi or rales  Abd: soft, nontender, no hepatomegaly  Ext: no edema Musculoskeletal:  No deformities, BUE and BLE strength normal and equal Skin: warm and dry  Neuro:  CNs 2-12 intact, no focal abnormalities noted Psych:  Normal affect   EKG:  The EKG was personally reviewed and demonstrates:  sinus tachycardia; RBBB  Relevant CV Studies:  None  Laboratory Data:  High Sensitivity Troponin:   Recent Labs  Lab 07/19/22 1848 07/19/22 1925 07/19/22 2119  TROPONINIHS 108* 107* 113*     Chemistry Recent Labs  Lab 07/19/22 1925  NA 137  K 3.1*  CL 100  CO2 26  GLUCOSE 115*  BUN 12  CREATININE 1.00  CALCIUM 8.7*  MG 1.9  GFRNONAA >60  ANIONGAP 11    No results for input(s): "PROT", "ALBUMIN", "AST", "ALT", "ALKPHOS", "BILITOT" in the last 168 hours. Lipids No results for input(s): "CHOL", "TRIG", "HDL", "LABVLDL", "LDLCALC", "CHOLHDL" in the last 168 hours.  Hematology Recent Labs  Lab 07/19/22 1925  WBC 6.6  RBC 4.43  HGB 13.2  HCT 39.0  MCV 88.0  MCH 29.8  MCHC 33.8  RDW 13.0  PLT 365   Thyroid  Recent Labs  Lab 07/19/22 1925  TSH 0.947    BNPNo results for input(s): "BNP", "PROBNP" in the last 168 hours.  DDimer No results for input(s): "DDIMER" in the last 168 hours.   Radiology/Studies:  DG Chest 2 View  Result Date: 07/19/2022 CLINICAL DATA:  Chest pain. EXAM: CHEST - 2 VIEW COMPARISON:  March 31, 2021 FINDINGS: The heart size and mediastinal contours are within normal limits. Both  lungs are clear. The visualized skeletal structures are unremarkable. IMPRESSION: No active cardiopulmonary disease. Electronically Signed   By: Zetta Bills M.D.   On: 07/19/2022 17:27     Assessment and Plan:   # SVT # Troponin elevation # HTN #  Hypokalemia  Patient presented with SVT- converted with adenosine and cardizem Metoprolol 25 mg BID Telemetry K >4 and Mg >2 Low K most likely due to HCTZ. Switch to other BP med Echo in AM Check TSH Troponin elevation most likely due to demand from SVT- however her chest heaviness has persisted. Heparin started in the ED by primary team. Okay to continue for now Will consider non-invasive stress testing in AM. If troponin bumps or pain significantly persists, will then consider LHC   Signed, Jaci Lazier, MD  07/20/2022 1:37 AM

## 2022-07-20 NOTE — Progress Notes (Signed)
Echocardiogram 2D Echocardiogram has been performed.  Terri [name] M 07/20/2022, 9:31 AM

## 2022-07-20 NOTE — ED Notes (Signed)
ED TO INPATIENT HANDOFF REPORT  ED Nurse Name and Phone #: [phone]  S Name/Age/Gender Terri [name] 37 y.o. female Room/Bed: 029C/029C  Code Status   Code Status: Full Code  Home/SNF/Other Home Patient oriented to: self, place, time, and situation Is this baseline? Yes   Triage Complete: Triage complete  Chief Complaint SVT (supraventricular tachycardia) [I47.10]  Triage Note Pt was bibgems for chest pain. Pt was cleaning and then felt heart racing and beating. Pt for EMS was originally 240 bpm. EMS gave '6mg'$  Adneosine and then 20 mg of cardizem in route. Pt after SVT went tachy in the low 100s then raised again to 170s, then after cardizem is back to the 80s irregular. Pt is alert and oriented speaking in full sentences   NO hx of afib  BP 150/100   Allergies No Known Allergies  Level of Care/Admitting Diagnosis ED Disposition     ED Disposition  Admit   Condition  --   Comment  Hospital Area: [facility] [[id number]]  Level of Care: Telemetry Cardiac [[id number]]  May place patient in observation at [facility] or [place] if equivalent level of care is available:: No  Covid Evaluation: Asymptomatic - no recent exposure (last 10 days) testing not required  Diagnosis: SVT (supraventricular tachycardia) [093267]  Admitting Physician: Rhetta Mura [[id number]]  Attending Physician: Rhetta Mura [[id number]]          B Medical/Surgery History Past Medical History:  Diagnosis Date   Asthma    Chronic hypertension with superimposed severe preeclampsia 01/31/2019   Hypertension    Hypertention, malignant, with acute intensive management    Supervision of other high risk pregnancy, antepartum 01/07/2019   .[name] Nursing Staff Provider Office Location  [facility] Dating  LMP Language  English Anatomy US  Incomplete but wnl, f/u wnl Flu Vaccine  Declined Genetic Screen  NIPS:   AFP:   First Screen:  Quad:   TDaP vaccine    Hgb A1C or  GTT Early  Third  trimester nl 2 hour Rhogam     LAB RESULTS  Feeding Plan Breast & Bottle Blood Type O/Positive/-- (08/13 1132)  Contraception Declined Antibody Negative (08/13   History reviewed. No pertinent surgical history.   A IV Location/Drains/Wounds Patient Lines/Drains/Airways Status     Active Line/Drains/Airways     Name Placement date Placement time Site Days   Peripheral IV 07/19/22 18 G Left Antecubital 07/19/22  --  Antecubital  1            Intake/Output Last 24 hours No intake or output data in the 24 hours ending 07/20/22 0044  Labs/Imaging Results for orders placed or performed during the hospital encounter of 07/19/22 (from the past 48 hour(s))  I-Stat beta hCG blood, ED     Status: None   Collection Time: 07/19/22  5:10 PM  Result Value Ref Range   I-stat hCG, quantitative <5.0 <5 mIU/mL   Comment 3            Comment:   GEST. AGE      CONC.  (mIU/mL)   <=1 WEEK        5 - 50     2 WEEKS       50 - 500     3 WEEKS       100 - 10,000     4 WEEKS     1,000 - 30,000        FEMALE AND NON-PREGNANT  FEMALE:     LESS THAN 5 mIU/mL   Troponin I (High Sensitivity)     Status: Abnormal   Collection Time: 07/19/22  6:48 PM  Result Value Ref Range   Troponin I (High Sensitivity) 108 (HH) <18 ng/L    Comment: CRITICAL RESULT CALLED TO, READ BACK BY AND VERIFIED WITH F SERINR,RN 2000 07/19/2022 WBOND (NOTE) Elevated high sensitivity troponin I (hsTnI) values and significant  changes across serial measurements may suggest ACS but many other  chronic and acute conditions are known to elevate hsTnI results.  Refer to the "Links" section for chest pain algorithms and additional  guidance. Performed at [facility], [place] [address] [place], [place] [address]   Basic metabolic panel     Status: Abnormal   Collection Time: 07/19/22  7:25 PM  Result Value Ref Range   Sodium 137 135 - 145 mmol/L   Potassium 3.1 (L) 3.5 - 5.1 mmol/L   Chloride 100 98 - 111 mmol/L   CO2 26 22  - 32 mmol/L   Glucose, Bld 115 (H) 70 - 99 mg/dL    Comment: Glucose reference range applies only to samples taken after fasting for at least 8 hours.   BUN 12 6 - 20 mg/dL   Creatinine, Ser 1.00 0.44 - 1.00 mg/dL   Calcium 8.7 (L) 8.9 - 10.3 mg/dL   GFR, Estimated >60 >60 mL/min    Comment: (NOTE) Calculated using the CKD-EPI Creatinine Equation (2021)    Anion gap 11 5 - 15    Comment: Performed at [facility] [address], [place], [place] [address]  CBC with Differential/Platelet     Status: None   Collection Time: 07/19/22  7:25 PM  Result Value Ref Range   WBC 6.6 4.0 - 10.5 K/uL   RBC 4.43 3.87 - 5.11 MIL/uL   Hemoglobin 13.2 12.0 - 15.0 g/dL   HCT 39.0 36.0 - 46.0 %   MCV 88.0 80.0 - 100.0 fL   MCH 29.8 26.0 - 34.0 pg   MCHC 33.8 30.0 - 36.0 g/dL   RDW 13.0 11.5 - 15.5 %   Platelets 365 150 - 400 K/uL   nRBC 0.0 0.0 - 0.2 %   Neutrophils Relative % 43 %   Neutro Abs 2.8 1.7 - 7.7 K/uL   Lymphocytes Relative 47 %   Lymphs Abs 3.1 0.7 - 4.0 K/uL   Monocytes Relative 8 %   Monocytes Absolute 0.5 0.1 - 1.0 K/uL   Eosinophils Relative 1 %   Eosinophils Absolute 0.1 0.0 - 0.5 K/uL   Basophils Relative 1 %   Basophils Absolute 0.0 0.0 - 0.1 K/uL   Immature Granulocytes 0 %   Abs Immature Granulocytes 0.01 0.00 - 0.07 K/uL    Comment: Performed at [facility], [address], [place],  [address]  Magnesium     Status: None   Collection Time: 07/19/22  7:25 PM  Result Value Ref Range   Magnesium 1.9 1.7 - 2.4 mg/dL    Comment: Performed at [facility] [address], [place], [place] [address]  Troponin I (High Sensitivity)     Status: Abnormal   Collection Time: 07/19/22  7:25 PM  Result Value Ref Range   Troponin I (High Sensitivity) 107 (HH) <18 ng/L    Comment: CRITICAL RESULT CALLED TO, READ BACK BY AND VERIFIED WITH Myna Hidalgo 2040 07/19/2022 WBOND (NOTE) Elevated high sensitivity troponin I (hsTnI) values and  significant   changes across serial measurements may suggest ACS but many other  chronic and acute conditions are known to elevate hsTnI results.  Refer to the "Links" section for chest pain algorithms and additional  guidance. Performed at [facility], [place] [address] [place], [place] [address]   TSH     Status: None   Collection Time: 07/19/22  7:25 PM  Result Value Ref Range   TSH 0.947 0.350 - 4.500 uIU/mL    Comment: Performed by a 3rd Generation assay with a functional sensitivity of <=0.01 uIU/mL. Performed at [facility], [place] [address], [place], [place] [address]   Troponin I (High Sensitivity)     Status: Abnormal   Collection Time: 07/19/22  9:19 PM  Result Value Ref Range   Troponin I (High Sensitivity) 113 (HH) <18 ng/L    Comment: CRITICAL VALUE NOTED. VALUE IS CONSISTENT WITH PREVIOUSLY REPORTED/CALLED VALUE (NOTE) Elevated high sensitivity troponin I (hsTnI) values and significant  changes across serial measurements may suggest ACS but many other  chronic and acute conditions are known to elevate hsTnI results.  Refer to the "Links" section for chest pain algorithms and additional  guidance. Performed at [facility], [place] [address], [place], [place] [address]    DG Chest 2 View  Result Date: 07/19/2022 CLINICAL DATA:  Chest pain. EXAM: CHEST - 2 VIEW COMPARISON:  March 31, 2021 FINDINGS: The heart size and mediastinal contours are within normal limits. Both lungs are clear. The visualized skeletal structures are unremarkable. IMPRESSION: No active cardiopulmonary disease. Electronically Signed   By: Zetta Bills M.D.   On: 07/19/2022 17:27    Pending Labs Unresulted Labs (From admission, onward)     Start     Ordered   07/21/22 0500  Heparin level (unfractionated)  Daily,   R      07/19/22 2240   07/20/22 0500  Heparin level (unfractionated)  Once-Timed,   URGENT        07/19/22 2240   07/20/22 0500  CBC with Differential/Platelet  Tomorrow  morning,   R        07/19/22 2354   07/20/22 0500  Comprehensive metabolic panel  Tomorrow morning,   R        07/19/22 2354   07/20/22 0500  Magnesium  Tomorrow morning,   R        07/19/22 2354   07/19/22 1706  CBC with Differential  Once,   STAT        07/19/22 1705            Vitals/Pain Today's Vitals   07/19/22 2345 07/20/22 0000 07/20/22 0015 07/20/22 0030  BP: (!) 147/93 134/80 125/77 (!) 139/108  Pulse: 94 93 91 92  Resp: '11 15 16 14  '$ Temp:      TempSrc:      SpO2: 99% 99% 99% 97%  Weight:      Height:      PainSc:        Isolation Precautions No active isolations  Medications Medications  aspirin tablet 325 mg (325 mg Oral Given 07/19/22 2026)  heparin ADULT infusion 100 units/mL (25000 units/270m) (1,200 Units/hr Intravenous New Bag/Given 07/20/22 0039)  acetaminophen (TYLENOL) tablet 650 mg (has no administration in time range)    Or  acetaminophen (TYLENOL) suppository 650 mg (has no administration in time range)  melatonin tablet 3 mg (has no administration in time range)  magnesium sulfate IVPB 2 g 50  mL (has no administration in time range)  labetalol (NORMODYNE) injection 10 mg (10 mg Intravenous Given 07/19/22 2020)  potassium chloride SA (KLOR-CON M) CR tablet 40 mEq (40 mEq Oral Given 07/19/22 2219)  heparin bolus via infusion 4,000 Units (4,000 Units Intravenous Bolus from Bag 07/20/22 0040)    Mobility walks     Focused Assessments Cardiac Assessment Handoff:  Cardiac Rhythm: Supraventricular tachycardia (patient was in SVT prior to EMS arrival) Lab Results  Component Value Date   TROPONINI <0.30 11/11/2013   Lab Results  Component Value Date   DDIMER 0.27 03/31/2021   Does the Patient currently have chest pain? No    R Recommendations: See Admitting Provider Note  Report given to:   Additional Notes:

## 2022-07-20 NOTE — Care Management (Signed)
  Transition of Care [facility]) Screening Note   Patient Details  Name: Terri [name] Date of Birth: February 02, 1986   [facility] CM/SW Contact:    Bethena Roys, RN Phone Number: 07/20/2022, 12:50 PM    Transition of Care Department [facility]) has reviewed the patient and no TOC needs have been identified at this time. We will continue to monitor patient advancement through interdisciplinary progression rounds. If new patient transition needs arise, please place a TOC consult.

## 2022-07-20 NOTE — Progress Notes (Addendum)
Rounding Note    Patient Name: Terri [name] Date of Encounter: 07/20/2022  [place] Cardiologist: None   Subjective   Patient reports that she has no chest pain this morning, just significant fatigue. She also denies heavy palpitations/awareness of racing heart beat like she had yesterday. Patient adds today that she occasionally smokes marijuana.  Inpatient Medications    Scheduled Meds:  aspirin  325 mg Oral Daily   Continuous Infusions:  heparin 1,300 Units/hr (07/20/22 0713)   PRN Meds: acetaminophen **OR** acetaminophen, melatonin   Vital Signs    Vitals:   07/20/22 0015 07/20/22 0030 07/20/22 0113 07/20/22 0120  BP: 125/77 (!) 139/108  (!) 143/86  Pulse: 91 92  96  Resp: '16 14  15  '$ Temp:    98.7 F (37.1 C)  TempSrc:    Oral  SpO2: 99% 97%  100%  Weight:   119.3 kg   Height:   '5\' 8"'$  (1.727 m)     Intake/Output Summary (Last 24 hours) at 07/20/2022 0749 Last data filed at 07/20/2022 0326 Gross per 24 hour  Intake 92.63 ml  Output --  Net 92.63 ml      07/20/2022    1:13 AM 07/19/2022    4:49 PM 07/12/2022   12:56 PM  Last 3 Weights  Weight (lbs) 263 lb 210 lb 220 lb  Weight (kg) 119.296 kg 95.255 kg 99.791 kg      Telemetry    Patient has a very consistent borderline tachycardia-tachycardia rate between 95-102. QRS is widened, in atypical RBBB appearance with slurred S waves in precordial leads  ECG    New tracing requested this morning  Physical Exam   GEN: No acute distress.   Neck: No JVD Cardiac: RRR, no murmurs, rubs, or gallops.  Respiratory: Clear to auscultation bilaterally. GI: Soft, nontender, non-distended  MS: No edema; No deformity. Neuro:  Nonfocal  Psych: Normal affect   Labs    High Sensitivity Troponin:   Recent Labs  Lab 07/19/22 1848 07/19/22 1925 07/19/22 2119 07/20/22 0443  TROPONINIHS 108* 107* 113* 64*     Chemistry Recent Labs  Lab 07/19/22 1925 07/20/22 0443  NA 137 137  K 3.1* 3.3*  CL  100 101  CO2 26 25  GLUCOSE 115* 123*  BUN 12 12  CREATININE 1.00 0.93  CALCIUM 8.7* 8.7*  MG 1.9 2.3  PROT  --  7.0  ALBUMIN  --  3.5  AST  --  17  ALT  --  13  ALKPHOS  --  66  BILITOT  --  0.5  GFRNONAA >60 >60  ANIONGAP 11 11    Lipids No results for input(s): "CHOL", "TRIG", "HDL", "LABVLDL", "LDLCALC", "CHOLHDL" in the last 168 hours.  Hematology Recent Labs  Lab 07/19/22 1925 07/20/22 0443  WBC 6.6 6.8  RBC 4.43 4.27  HGB 13.2 12.6  HCT 39.0 37.8  MCV 88.0 88.5  MCH 29.8 29.5  MCHC 33.8 33.3  RDW 13.0 13.2  PLT 365 365   Thyroid  Recent Labs  Lab 07/19/22 1925  TSH 0.947    BNP Recent Labs  Lab 07/20/22 0443  BNP 87.7    DDimer No results for input(s): "DDIMER" in the last 168 hours.   Radiology    DG Chest 2 View  Result Date: 07/19/2022 CLINICAL DATA:  Chest pain. EXAM: CHEST - 2 VIEW COMPARISON:  March 31, 2021 FINDINGS: The heart size and mediastinal contours are within normal limits. Both  lungs are clear. The visualized skeletal structures are unremarkable. IMPRESSION: No active cardiopulmonary disease. Electronically Signed   By: Zetta Bills M.D.   On: 07/19/2022 17:27    Cardiac Studies   TTE pending  Patient Profile     37 y.o. female with medical history of hypertension who is being seen by cardiology for evaluation of tachy-arrhythmia at the request of Dr. Velia Meyer.  Assessment & Plan    Tachycardia  Patient admitted after presenting to the ED with tachy-arrhythmia concerning for SVT. Received adenosine and cardizem with EMS and has since maintained a stable HR between about 95-105. No longer having chest pain or significant palpitations.   While patient's HR remains improved from high rates noted by EMS, telemetry and ECG continue to show persistently elevated rates, RBBB morphology.  Start Metoprolol '25mg'$  BID Will follow up on TTE results.  Low threshold for EP involvement given patient's age Maintain K>4, Mg>2  Chest  discomfort  Patient initially reporting chest discomfort following prolonged episode of tachycardia. Troponin checked and found elevated, now trending down: 108, 107, 113, 64. Patient is now without chest pain.   Suspect tachy-mediated troponin elevation. Will stop heparin infusion this morning. No indication for ischemic evaluation at this time. If TTE appears abnormal, will revisit.  Hypertension  Patient with history of hypertension following her most recent pregnancy. Has tried HCTZ, Labetalol, and amlodipine in the past with moderate relief of BP. Unfortunately, reports headaches with Amlodipine.  Although unclear whether patient has recently taken HCTZ, would prefer to not resume given hypokalemia. ARB not a great option given patient's age/chance of pregnancy. Will see how BP responds to above Metoprolol but would be a good coreg candidate as well.   Suspected OSA  Patient reports nighttime apnea and snoring. Suspect undiagnosed OSA. Weight loss encouraged. Should have outpatient sleep study.       For questions or updates, please contact [place] Please consult [email or web address] for contact info under        Signed, Lily Kocher, PA-C  07/20/2022, 7:49 AM    Patient seen and examined with Lily Kocher, PA-C.  Agree as above, with the following exceptions and changes as noted below.  Patient is currently chest pain-free but did have discomfort with episode of SVT earlier. Gen: NAD, CV: RRR, no murmurs, Lungs: clear, Abd: soft, Extrem: Warm, well perfused, no edema, Neuro/Psych: alert and oriented x 3, normal mood and affect. All available labs, radiology testing, previous records reviewed.  She likely has obstructive sleep apnea given snoring and presentation with SVT, also has hypertension.  Presents with hypokalemia and takes HCTZ at home.  We will adjust her blood pressure medications.  Given that she is of childbearing age, recommend pregnancy safe antihypertensive  therapy, should consider amlodipine.  Can continue metoprolol at this time.  Await echocardiogram for evaluation of structural heart disease.  Elouise Munroe, MD 07/20/22 11:58 AM

## 2022-07-27 ENCOUNTER — Encounter: Payer: Self-pay | Admitting: Nurse Practitioner

## 2022-07-27 ENCOUNTER — Ambulatory Visit: Payer: Medicaid Other | Attending: Nurse Practitioner | Admitting: Nurse Practitioner

## 2022-07-27 VITALS — BP 138/100 | HR 89 | Ht 68.0 in | Wt 264.0 lb

## 2022-07-27 DIAGNOSIS — I3139 Other pericardial effusion (noninflammatory): Secondary | ICD-10-CM

## 2022-07-27 DIAGNOSIS — I471 Supraventricular tachycardia, unspecified: Secondary | ICD-10-CM | POA: Diagnosis not present

## 2022-07-27 DIAGNOSIS — I10 Essential (primary) hypertension: Secondary | ICD-10-CM | POA: Diagnosis not present

## 2022-07-27 DIAGNOSIS — R0683 Snoring: Secondary | ICD-10-CM | POA: Diagnosis not present

## 2022-07-27 DIAGNOSIS — R7989 Other specified abnormal findings of blood chemistry: Secondary | ICD-10-CM | POA: Diagnosis not present

## 2022-07-27 DIAGNOSIS — Z9189 Other specified personal risk factors, not elsewhere classified: Secondary | ICD-10-CM | POA: Diagnosis not present

## 2022-07-27 MED ORDER — AMLODIPINE BESYLATE 10 MG PO TABS: 10.0000 mg | ORAL_TABLET | Freq: Every day | ORAL | 3 refills | Status: DC

## 2022-07-27 NOTE — Patient Instructions (Addendum)
Medication Instructions:  Increase Amlodipine 10 mg daily   *If you need a refill on your cardiac medications before your next appointment, please call your pharmacy*   Lab Work: BMET in 1 week  If you have labs (blood work) drawn today and your tests are completely normal, you will receive your results only by: [facility] (if you have MyChart) OR A paper copy in the mail If you have any lab test that is abnormal or we need to change your treatment, we will call you to review the results.   Testing/Procedures: Your physician has recommended that you have a sleep study. This test records several body functions during sleep, including: brain activity, eye movement, oxygen and carbon dioxide blood levels, heart rate and rhythm, breathing rate and rhythm, the flow of air through your mouth and nose, snoring, body muscle movements, and chest and belly movement.   Your physician has requested that you have an echocardiogram. Echocardiography is a painless test that uses sound waves to create images of your heart. It provides your doctor with information about the size and shape of your heart and how well your heart's chambers and valves are working. This procedure takes approximately one hour. There are no restrictions for this procedure. Please do NOT wear cologne, perfume, aftershave, or lotions (deodorant is allowed). Please arrive 15 minutes prior to your appointment time.   Follow-Up: At [facility], you and your health needs are our priority.  As part of our continuing mission to provide you with exceptional heart care, we have created designated Provider Care Teams.  These Care Teams include your primary Cardiologist (physician) and Advanced Practice Providers (APPs -  Physician Assistants and Nurse Practitioners) who all work together to provide you with the care you need, when you need it.  We recommend signing up for the patient portal called "MyChart".  Sign up  information is provided on this After Visit Summary.  MyChart is used to connect with patients for Virtual Visits (Telemedicine).  Patients are able to view lab/test results, encounter notes, upcoming appointments, etc.  Non-urgent messages can be sent to your provider as well.   To learn more about what you can do with MyChart, go to [email or web address].    Your next appointment:   4-6 month(s) 6-8 weeks Diona Browner NP) Provider:   Elouise Munroe, MD     Other Instructions Monitor BP. Goal is 130/80

## 2022-07-27 NOTE — Progress Notes (Addendum)
Office Visit    Patient Name: Terri [name] Date of Encounter: 07/27/2022  Primary Care Provider:  Eppie Gibson, MD Primary Cardiologist:  Elouise Munroe, MD  Chief Complaint    37 year old female with a history of PSVT, hypertension, and asthma who presents for hospital follow-up related to SVT.  Past Medical History    Past Medical History:  Diagnosis Date   Asthma    Chronic hypertension with superimposed severe preeclampsia 01/31/2019   Hypertension    Hypertention, malignant, with acute intensive management    Supervision of other high risk pregnancy, antepartum 01/07/2019   .[name] Nursing Staff Provider Office Location  [facility] Dating  LMP Language  English Anatomy US  Incomplete but wnl, f/u wnl Flu Vaccine  Declined Genetic Screen  NIPS:   AFP:   First Screen:  Quad:   TDaP vaccine    Hgb A1C or  GTT Early  Third trimester nl 2 hour Rhogam     LAB RESULTS  Feeding Plan Breast & Bottle Blood Type O/Positive/-- (08/13 1132)  Contraception Declined Antibody Negative (08/13   No past surgical history on file.  Allergies  No Known Allergies   Labs/Other Studies Reviewed    The following studies were reviewed today: Echo 07/20/2022: IMPRESSIONS    1. Left ventricular ejection fraction, by estimation, is 55 to 60%. Left  ventricular ejection fraction by 3D volume is 52 %. The left ventricle has  normal function. The left ventricle has no regional wall motion  abnormalities. There is moderate  concentric left ventricular hypertrophy of the basal-septal segment. Left  ventricular diastolic parameters are consistent with Grade II diastolic  dysfunction (pseudonormalization). The average left ventricular global  longitudinal strain is -15.7 %. The  global longitudinal strain is abnormal.   2. Right ventricular systolic function is normal. The right ventricular  size is normal.   3. A small pericardial effusion is present. The pericardial effusion is  anterior to the  right ventricle.   4. The mitral valve is normal in structure. No evidence of mitral valve  regurgitation. No evidence of mitral stenosis.   5. The aortic valve is tricuspid. Aortic valve regurgitation is not  visualized. No aortic stenosis is present.   6. The inferior vena cava is normal in size with greater than 50%  respiratory variability, suggesting right atrial pressure of 3 mmHg.   Comparison(s): No prior Echocardiogram.   Recent Labs: 07/19/2022: TSH 0.947 07/20/2022: ALT 13; B Natriuretic Peptide 87.7; BUN 12; Creatinine, Ser 0.93; Hemoglobin 12.6; Magnesium 2.3; Platelets 365; Potassium 3.3; Sodium 137  Recent Lipid Panel No results found for: "CHOL", "TRIG", "HDL", "CHOLHDL", "VLDL", "LDLCALC", "LDLDIRECT"  History of Present Illness    37 year old female with the above past medical history including PSVT, hypertension, and asthma.  She presented to the ED on 07/19/2022 in the setting of palpitations.  She noticed her heart rate was approximately 130 bpm, she felt short of breath.  EMS was called.  She was noted to be in SVT.  She received adenosine and Cardizem with restoration of NSR.  She did note some mild residual chest heaviness following this episode.  Troponin was elevated.  Cardiology was consulted.  Her chest pain resolved.  Echocardiogram showed EF 55 to 60%, normal LV function, no RWMA, moderate concentric LVH, G2 DD, normal RV systolic function, small pericardial effusion, no significant valvular abnormalities.  Further ischemic evaluation was deferred given overall normal echocardiogram.  She was started on metoprolol. She did  present with hypokalemia, HCTZ was discontinued and she was started on amlodipine. She also reported a history of nighttime apnea and snoring.  Outpatient sleep study was recommended.  She was discharged home in stable condition on 07/20/2022.  She presents today for follow-up accompanied by her 34-year-old son. Since her hospitalization she has been  stable overall from a cardiac standpoint.  She continues to note intermittent palpitations, she denies any chest pain, dyspnea, dizziness, presyncope, syncope.  She has noted ongoing elevated BP at home well as fatigue since starting metoprolol.  She notes that she has been anxious about increasing her activity and feels traumatized by her recent hospital stay.  She is eager to return to her daily activities including exercising at the gym but is afraid to do so.  She states that she would like to hear from Dr. Margaretann Loveless, that it is ok for her to resume exercise at the gym.  Home Medications    Current Outpatient Medications  Medication Sig Dispense Refill   amLODipine (NORVASC) 10 MG tablet Take 1 tablet (10 mg total) by mouth daily. 90 tablet 3   Blood Pressure KIT Use to check blood pressure as instructed by your physician 1 kit 0   metoprolol tartrate (LOPRESSOR) 25 MG tablet Take 1 tablet (25 mg total) by mouth 2 (two) times daily. 60 tablet 1   No current facility-administered medications for this visit.     Review of Systems    She denies chest pain, dyspnea, pnd, orthopnea, n, v, dizziness, syncope, edema, weight gain, or early satiety. All other systems reviewed and are otherwise negative except as noted above.   Physical Exam    VS:  BP (!) 138/100   Pulse 89   Ht 5' 8"$  (1.727 m)   Wt 264 lb (119.7 kg)   LMP 07/19/2022 (Approximate)   BMI 40.14 kg/m   GEN: Well nourished, well developed, in no acute distress. HEENT: normal. Neck: Supple, no JVD, carotid bruits, or masses. Cardiac: RRR, no murmurs, rubs, or gallops. No clubbing, cyanosis, edema.  Radials/DP/PT 2+ and equal bilaterally.  Respiratory:  Respirations regular and unlabored, clear to auscultation bilaterally. GI: Soft, nontender, nondistended, BS + x 4. MS: no deformity or atrophy. Skin: warm and dry, no rash. Neuro:  Strength and sensation are intact. Psych: Normal affect.  Accessory Clinical Findings    ECG  personally reviewed by me today -NSR , 89 bpm, biatrial lodgment, RBBB- no acute changes.   Lab Results  Component Value Date   WBC 6.8 07/20/2022   HGB 12.6 07/20/2022   HCT 37.8 07/20/2022   MCV 88.5 07/20/2022   PLT 365 07/20/2022   Lab Results  Component Value Date   CREATININE 0.93 07/20/2022   BUN 12 07/20/2022   NA 137 07/20/2022   K 3.3 (L) 07/20/2022   CL 101 07/20/2022   CO2 25 07/20/2022   Lab Results  Component Value Date   ALT 13 07/20/2022   AST 17 07/20/2022   ALKPHOS 66 07/20/2022   BILITOT 0.5 07/20/2022   No results found for: "CHOL", "HDL", "LDLCALC", "LDLDIRECT", "TRIG", "CHOLHDL"  No results found for: "HGBA1C"  Assessment & Plan    1. PSVT: She notes intermittent fleeting palpitations. She has had some fatigue since starting metoprolol.  Continue to monitor symptoms.  I encouraged increase activity as tolerated, however, will confirm this is ok with Dr. Margaretann Loveless.  If palpitations persist, consider titration of beta-blocker therapy.  If she is unable to tolerate increased  dose of beta-blocker, and continues to have significant symptoms, she would likely benefit from outpatient cardiac monitor, possible EP referral.  Reviewed ED precautions, vagal maneuvers. Continue metoprolol.  Addendum 07/31/2022: Per Dr. Margaretann Loveless, pt ok to resume full exercise.  Patient notified of recommendations.  Patient was advised to report any new symptoms of chest pain, palpitations, dizziness, shortness of breath.  2. Elevated troponin: Likely in the setting of demand ischemia/SVT.  She denies symptoms concerning for angina.  Recent echo was overall normal.  No indication for ischemic evaluation at this time.  3. Pericardial effusion: Small pericardial effusion noted on most recent echo, asymptomatic. Will repeat limited echo in 1 month to see if this has resolved.   4. Hypertension: BP remains elevated above goal.  Will increase amlodipine to 10 mg daily.  We discussed the possible  side effect of lower extremity swelling with increased dosing.  I advised her to continue to monitor BP and report BP consistently greater than 130/80.  Otherwise, continue current antihypertensive regimen.  5. History of snoring/concern for OSA: She notes a history of loud snoring, nonrestorative sleep, and apneic periods while sleeping.  STOP-BANG of 5, Epworth scale of 8.  Will arrange for [facility] home sleep study.  6. Disposition: Follow-up in 6 to 8 weeks with APP, 4 to 6 months with Dr. Margaretann Loveless.   HYPERTENSION CONTROL Vitals:   07/27/22 1014 07/27/22 1040  BP: (!) 140/106 (!) 138/100    The patient's blood pressure is elevated above target today.  In order to address the patient's elevated BP: Blood pressure will be monitored at home to determine if medication changes need to be made.; A current anti-hypertensive medication was adjusted today.; Follow up with general cardiology has been recommended.     Lenna Sciara, NP 07/27/2022, 1:34 PM

## 2022-07-31 NOTE — Addendum Note (Signed)
Addended by: Brantley Persons A on: 07/31/2022 11:22 AM   Modules accepted: Orders

## 2022-08-02 ENCOUNTER — Telehealth: Payer: Self-pay

## 2022-08-02 NOTE — Telephone Encounter (Signed)
Called and made the patient aware that SHE may proceed with the [facility] Sleep Study. PIN # provided to the patient. Patient made aware that SHE will be contacted after the test has been read with the results and any recommendations. Patient verbalized understanding and thanked me for the call.

## 2022-08-05 ENCOUNTER — Encounter (INDEPENDENT_AMBULATORY_CARE_PROVIDER_SITE_OTHER): Payer: Medicaid Other | Admitting: Cardiology

## 2022-08-05 DIAGNOSIS — G4733 Obstructive sleep apnea (adult) (pediatric): Secondary | ICD-10-CM

## 2022-08-06 ENCOUNTER — Ambulatory Visit: Payer: Medicaid Other | Attending: Nurse Practitioner

## 2022-08-06 DIAGNOSIS — R0683 Snoring: Secondary | ICD-10-CM

## 2022-08-06 DIAGNOSIS — Z9189 Other specified personal risk factors, not elsewhere classified: Secondary | ICD-10-CM

## 2022-08-06 NOTE — Procedures (Signed)
SLEEP STUDY REPORT Patient Information Study Date: 08/06/2022 Patient Name: Terri [name] Patient ID: [id number] Birth Date: 03-Nov-1985 Age: 37 Gender: Female BMI: 40.1 (W=264 lb, H=5' 8'') Referring Physician: Diona Browner, NP  TEST DESCRIPTION:  Home sleep apnea testing was completed using the WatchPat, a Type 1 device, utilizing peripheral arterial tonometry (PAT), chest movement, actigraphy, pulse oximetry, pulse rate, body position and snore.  AHI was calculated with apnea and hypopnea using valid sleep time as the denominator. RDI includes apneas, hypopneas, and RERAs.  The data acquired and the scoring of sleep and all associated events were performed in accordance with the recommended standards and specifications as outlined in the AASM Manual for the Scoring of Sleep and Associated Events 2.2.0 (2015).  FINDINGS:  1.  Moderate Obstructive Sleep Apnea with AHI 15.9/hr.   2.  No Central Sleep Apnea with pAHIc 2.1/hr.  3.  Oxygen desaturations as low as 86%.  4.  Mild snoring was present. O2 sats were < 88% for 0 min.  5.  Total sleep time was 3 hrs and 46 min.  6.  28.5% of total sleep time was spent in REM sleep.   7.  Prolonged sleep onset latency at 45 min  8.  Shortened REM sleep onset latency at 56 min.   9.  Total awakenings were 14.  10. Arrhythmia detection:  None.  DIAGNOSIS:   Moderate Obstructive Sleep Apnea (G47.33)  RECOMMENDATIONS:   1.  Clinical correlation of these findings is necessary.  The decision to treat obstructive sleep apnea (OSA) is usually based on the presence of apnea symptoms or the presence of associated medical conditions such as Hypertension, Congestive Heart Failure, Atrial Fibrillation or Obesity.  The most common symptoms of OSA are snoring, gasping for breath while sleeping, daytime sleepiness and fatigue.   2.  Initiating apnea therapy is recommended given the presence of symptoms and/or associated conditions. Recommend proceeding with  one of the following:     a.  Auto-CPAP therapy with a pressure range of 5-20cm H2O.     b.  An oral appliance (OA) that can be obtained from certain dentists with expertise in sleep medicine.  These are primarily of use in non-obese patients with mild and moderate disease.     c.  An ENT consultation which may be useful to look for specific causes of obstruction and possible treatment options.     d.  If patient is intolerant to PAP therapy, consider referral to ENT for evaluation for hypoglossal nerve stimulator.   3.  Close follow-up is necessary to ensure success with CPAP or oral appliance therapy for maximum benefit.  4.  A follow-up oximetry study on CPAP is recommended to assess the adequacy of therapy and determine the need for supplemental oxygen or the potential need for Bi-level therapy.  An arterial blood gas to determine the adequacy of baseline ventilation and oxygenation should also be considered.  5.  Healthy sleep recommendations include:  adequate nightly sleep (normal 7-9 hrs/night), avoidance of caffeine after noon and alcohol near bedtime, and maintaining a sleep environment that is cool, dark and quiet.  6.  Weight loss for overweight patients is recommended.  Even modest amounts of weight loss can significantly improve the severity of sleep apnea.  7.  Snoring recommendations include:  weight loss where appropriate, side sleeping, and avoidance of alcohol before bed.  8.  Operation of motor vehicle should not be performed when sleepy.  Signature: Fransico Him, MD; [employer]; Diplomat,  American Board of Sleep Medicine Electronically Signed: 08/06/2022

## 2022-08-11 ENCOUNTER — Telehealth: Payer: Self-pay | Admitting: Internal Medicine

## 2022-08-11 NOTE — Telephone Encounter (Signed)
Patient c/o Palpitations:  High priority if patient c/o lightheadedness, shortness of breath, or chest pain  How long have you had palpitations/irregular HR/ Afib? Are you having the symptoms now? yes  Are you currently experiencing lightheadedness, SOB or CP? no  Do you have a history of afib (atrial fibrillation) or irregular heart rhythm? No   Have you checked your BP or HR? (document readings if available):  no  Are you experiencing any other symptoms? no

## 2022-08-11 NOTE — Telephone Encounter (Signed)
Called patient, advised that she was having palpitations. She states she has noticed an increase in her heart feeling like its racing since she went to the ER and was discharged on 02/08- they prescribed Metoprolol Tartrate 25 mg twice daily. She states since starting this, she feels worse, she has noticed an increase in her anxiety after she takes the medication, and the feeling of palpations as increased. She seen Raquel Sarna, NP- who increase Amlodipine to 10 mg. Patient does not currently have a BP machine to track, but she is going to day to purchase one. She was instructed on when to check BP/HR. Patient verbalized understanding, she would like to know if there is something else to take or recommend she take to see if the Metoprolol is the cause.   Advised I would route to MD/RN/NP to seen patient recently.   Thanks!

## 2022-08-14 ENCOUNTER — Emergency Department (HOSPITAL_BASED_OUTPATIENT_CLINIC_OR_DEPARTMENT_OTHER): Payer: Medicaid Other | Admitting: Radiology

## 2022-08-14 ENCOUNTER — Encounter (HOSPITAL_BASED_OUTPATIENT_CLINIC_OR_DEPARTMENT_OTHER): Payer: Self-pay | Admitting: Emergency Medicine

## 2022-08-14 ENCOUNTER — Other Ambulatory Visit: Payer: Self-pay

## 2022-08-14 ENCOUNTER — Emergency Department (HOSPITAL_BASED_OUTPATIENT_CLINIC_OR_DEPARTMENT_OTHER)
Admission: EM | Admit: 2022-08-14 | Discharge: 2022-08-14 | Disposition: A | Discharge status: Home / Self Care | Payer: Medicaid Other | Attending: Emergency Medicine | Admitting: Emergency Medicine

## 2022-08-14 DIAGNOSIS — R002 Palpitations: Secondary | ICD-10-CM | POA: Diagnosis not present

## 2022-08-14 LAB — BASIC METABOLIC PANEL
Anion gap: 8 (ref 5–15)
BUN: 12 mg/dL (ref 6–20)
CO2: 26 mmol/L (ref 22–32)
Calcium: 9.7 mg/dL (ref 8.9–10.3)
Chloride: 103 mmol/L (ref 98–111)
Creatinine, Ser: 0.83 mg/dL (ref 0.44–1.00)
GFR, Estimated: >60 mL/min (ref >60)
Glucose, Bld: 97 mg/dL (ref 70–99)
Potassium: 3.5 mmol/L (ref 3.5–5.1)
Sodium: 137 mmol/L (ref 135–145)

## 2022-08-14 LAB — CBC
HCT: 38.8 % (ref 36.0–46.0)
Hemoglobin: 12.8 g/dL (ref 12.0–15.0)
MCH: 29 pg (ref 26.0–34.0)
MCHC: 33 g/dL (ref 30.0–36.0)
MCV: 87.8 fL (ref 80.0–100.0)
Platelets: 333 10*3/uL (ref 150–400)
RBC: 4.42 MIL/uL (ref 3.87–5.11)
RDW: 13.7 % (ref 11.5–15.5)
WBC: 5.3 10*3/uL (ref 4.0–10.5)
nRBC: 0 % (ref 0.0–0.2)

## 2022-08-14 LAB — PREGNANCY, URINE: Preg Test, Ur: NEGATIVE

## 2022-08-14 LAB — TROPONIN I (HIGH SENSITIVITY): Troponin I (High Sensitivity): 4 ng/L (ref <18)

## 2022-08-14 MED ORDER — DILTIAZEM HCL 30 MG PO TABS: 30.0000 mg | ORAL_TABLET | Freq: Four times a day (QID) | ORAL | 4 refills | Status: DC | PRN

## 2022-08-14 NOTE — ED Triage Notes (Signed)
Pt via pov from home with heart palpitations. She states she was recently admitted for cardiac workup due to tachycardia. She was put on metoprolol, which she states makes her nervous. She got the green light to stop the metoprolol earlier today but still feels like she is having palpitations and wants to be checked out. Pt alert & oriented, nad noted.

## 2022-08-14 NOTE — Discharge Instructions (Addendum)
Your palpitations are likely secondary to underdosing of your antiarrhythmia med, metoprolol, please take the diltiazem at home instead.  Stop taking the metoprolol as the cardiologist has not recommended, and take the diltiazem instead.  Follow-up with your cardiologist.  If you start feeling like your heart is racing out of your chest, have severe chest pain, shortness of breath, please return to the ER.

## 2022-08-14 NOTE — ED Provider Notes (Signed)
[place] EMERGENCY DEPARTMENT AT [facility] Provider Note   CSN: [id number] Arrival date & time: 08/14/22  1721     History  Chief Complaint  Patient presents with   Palpitations    Terri [name] is a 37 y.o. female, history of SVT, who presents to the ED secondary to feeling fatigued, and having intermittent palpitations for the last couple nights.  She states that she was diagnosed with SVT at her last hospitalization, discharged with metoprolol however she does not like how it has made her feel so she has only been taking it once a day for the last 2 days.  She states that since the she has had palpitations, that are worse at night, and feels very anxious.  Denies any chest pain.  States her cardiologist is aware that she does not like the metoprolol, so told her to get off it and start diltiazem instead.  She has not tried the diltiazem yet, but had a friend pick it up for today.     Home Medications Prior to Admission medications   Medication Sig Start Date End Date Taking? Authorizing Provider  amLODipine (NORVASC) 10 MG tablet Take 1 tablet (10 mg total) by mouth daily. 07/27/22   Joylene Grapes, NP  Blood Pressure KIT Use to check blood pressure as instructed by your physician 07/20/22   Zigmund Daniel., MD  diltiazem (CARDIZEM) 30 MG tablet Take 1 tablet (30 mg total) by mouth 4 (four) times daily as needed (palpitations). 08/14/22   Parke Poisson, MD  hydrALAZINE (APRESOLINE) 50 MG tablet Take 1 tablet (50 mg total) by mouth every 8 (eight) hours. Patient not taking: Reported on 09/10/2019 07/26/19 09/12/19  Allie Bossier, MD  NIFEdipine (ADALAT CC) 60 MG 24 hr tablet Take 1 tablet (60 mg total) by mouth 2 (two) times daily. Patient not taking: Reported on 09/10/2019 07/26/19 09/12/19  Allie Bossier, MD      Allergies    Patient has no known allergies.    Review of Systems   Review of Systems  Cardiovascular:  Positive for palpitations.    Physical  Exam Updated Vital Signs BP 122/87   Pulse 80   Temp 99.2 F (37.3 C) (Oral)   Resp (!) 21   Ht 5\' 8"  (1.727 m)   Wt 119.7 kg   LMP 07/19/2022 (Approximate)   SpO2 100%   BMI 40.12 kg/m  Physical Exam Vitals and nursing note reviewed.  Constitutional:      General: She is not in acute distress.    Appearance: She is well-developed.  HENT:     Head: Normocephalic and atraumatic.  Eyes:     Conjunctiva/sclera: Conjunctivae normal.  Cardiovascular:     Rate and Rhythm: Normal rate and regular rhythm.     Heart sounds: No murmur heard. Pulmonary:     Effort: Pulmonary effort is normal. No respiratory distress.     Breath sounds: Normal breath sounds.  Abdominal:     Palpations: Abdomen is soft.     Tenderness: There is no abdominal tenderness.  Musculoskeletal:        General: No swelling.     Cervical back: Neck supple.  Skin:    General: Skin is warm and dry.     Capillary Refill: Capillary refill takes less than 2 seconds.  Neurological:     Mental Status: She is alert.  Psychiatric:        Mood and Affect: Mood normal.  ED Results / Procedures / Treatments   Labs (all labs ordered are listed, but only abnormal results are displayed) Labs Reviewed  BASIC METABOLIC PANEL  CBC  PREGNANCY, URINE  TROPONIN I (HIGH SENSITIVITY)  TROPONIN I (HIGH SENSITIVITY)    EKG EKG Interpretation  Date/Time:  Monday August 14 2022 18:13:12 EST Ventricular Rate:  98 PR Interval:  178 QRS Duration: 128 QT Interval:  390 QTC Calculation: 497 R Axis:   77 Text Interpretation: Sinus rhythm with occasional Premature ventricular complexes Biatrial enlargement Right bundle branch block When compared with ECG of 20-Jul-2022 08:47, Premature ventricular complexes are now Present Confirmed by Alvester Chou [phone]) on 08/14/2022 11:03:20 PM  Radiology DG Chest 2 View  Result Date: 08/14/2022 CLINICAL DATA:  Cardiac palpitations EXAM: CHEST - 2 VIEW COMPARISON:  07/19/2022  FINDINGS: The heart size and mediastinal contours are within normal limits. Both lungs are clear. The visualized skeletal structures are unremarkable. IMPRESSION: No active cardiopulmonary disease. Electronically Signed   By: Alcide Clever M.D.   On: 08/14/2022 19:15    Procedures Procedures    Medications Ordered in ED Medications - No data to display  ED Course/ Medical Decision Making/ A&P                             Medical Decision Making Patient is a 37 year old female, here for palpitations that have been going on for the last couple days.  She states that this is worse at night, and she has only been taking Toprol once in the morning.  Denies any chest pain, but endorses shortness of breath during these episodes.  Cannot get them to stop, but states she has not done much today at them to stop.  That she is post to start on diltiazem, but has not picked it up yet.  Will obtain at chest x-ray, troponins, and EKG for further evaluation.  Amount and/or Complexity of Data Reviewed Labs: ordered.    Details: Troponin within normal limits Radiology: ordered.    Details: Chest x-ray clear ECG/medicine tests:  Decision-making details documented in ED Course. Discussion of management or test interpretation with external provider(s): Discussed with patient, EKG showing PVCs, I am suspicious that she may be going into SVT, at these times as she is only taking half the dose of metoprolol that she supposed to.  We discussed that she will need to start on the diltiazem tonight, and then to follow-up with cardiology.  She is not in SVT on my exam.  She denies any chest pain at this time, she is in agreement with plan, and will follow-up with cardiology.  Return precautions emphasized.   Final Clinical Impression(s) / ED Diagnoses Final diagnoses:  Palpitations    Rx / DC Orders ED Discharge Orders     None         Cevin Rubinstein, [place]ley [place], PA 08/14/22 2341    Terald Sleeper, MD 08/17/22  [phone]

## 2022-08-14 NOTE — Telephone Encounter (Signed)
Terri Munroe, MD  Caprice Beaver, LPN; Clinton Gallant, Belinda Block, RN; Diona Browner C, NP20 minutes ago (4:58 PM)    Can try diltiazem instead. Start with dilt 30 mg every 8 hours as needed for palpitations. Write to [place] in a week with symptoms and BP. GA   Called pt back regarding Dr. Delphina Cahill recommendations. Pt will stop taking metoprolol and start diltiazem '30mg'$  once every 8 hours PRN. Medication sent to pt's pharmacy of choice. Pt verbalizes understanding.

## 2022-08-14 NOTE — Telephone Encounter (Signed)
Spoke with pt regarding metoprolol tartrate, pt states that she feels like medication is making her feel anxious, palpitations are worst, she's not able to sleep, jittery and antsy. Pt feels more short of breath and has to stop on occasion to take a deep breath. Pt feels like she needs to be seen sooner than later in the office. Pt states that she believes that she will have to go to the ED to be seen. Told pt the earliest we have an office visit is Friday. Pt states she is taking herself to ED.

## 2022-08-14 NOTE — Telephone Encounter (Signed)
Patient called again to get next steps regarding metoprolol tartrate (LOPRESSOR) 25 MG tablet.

## 2022-08-15 ENCOUNTER — Telehealth: Payer: Self-pay

## 2022-08-15 ENCOUNTER — Ambulatory Visit: Payer: Medicaid Other | Attending: Nurse Practitioner

## 2022-08-15 DIAGNOSIS — R002 Palpitations: Secondary | ICD-10-CM

## 2022-08-15 NOTE — Progress Notes (Unsigned)
Enrolled for [employer] to mail a ZIO XT long term holter monitor to the patients address on file.   Dr. Margaretann Loveless to read.

## 2022-08-15 NOTE — Telephone Encounter (Signed)
Terri Sciara, NP  Derrick Ravel, CMA Pt was back in ED with palpitations, she is to stop metoprolol and start diltiazem. Can we see if she would be willing to wear a 7-day zio? Otherwise, start diltiazem and keep scheduled follow-up appt. Thanks, EM        Noted. Spoke with pt. Pt is aware of the above information. Pt is in agreement to wear the zio monitor for 7 days. Order placed, directions discussed and pt is aware that it takes 3-5 days for monitor to arrive. Pt will contact our office with any questions or concerns.

## 2022-08-22 ENCOUNTER — Telehealth: Payer: Self-pay | Admitting: *Deleted

## 2022-08-22 DIAGNOSIS — G4733 Obstructive sleep apnea (adult) (pediatric): Secondary | ICD-10-CM

## 2022-08-22 DIAGNOSIS — R0683 Snoring: Secondary | ICD-10-CM

## 2022-08-22 DIAGNOSIS — I10 Essential (primary) hypertension: Secondary | ICD-10-CM

## 2022-08-22 NOTE — Telephone Encounter (Signed)
The patient has been notified of the result. Left detailed message on voicemail and informed patient to call back..Glover Capano Green, CMA   

## 2022-08-22 NOTE — Telephone Encounter (Signed)
-----   Message from Lauralee Evener, [place] sent at 08/07/2022  9:23 AM EST -----  ----- Message ----- From: Sueanne Margarita, MD Sent: 08/06/2022   6:06 PM EST To: [facility] Sleep Studies  Please let patient know that they have sleep apnea.  Recommend therapeutic CPAP titration for treatment of patient's sleep disordered breathing.  If unable to perform an in lab titration then initiate ResMed auto CPAP from 4 to 15cm H2O with heated humidity and mask of choice and overnight pulse ox on CPAP.

## 2022-08-25 ENCOUNTER — Ambulatory Visit (HOSPITAL_COMMUNITY): Payer: Medicaid Other | Attending: Nurse Practitioner

## 2022-08-25 ENCOUNTER — Encounter (HOSPITAL_COMMUNITY): Payer: Self-pay | Admitting: Nurse Practitioner

## 2022-09-07 ENCOUNTER — Telehealth: Payer: Self-pay

## 2022-09-07 NOTE — Telephone Encounter (Signed)
..   Medicaid Managed Care   Unsuccessful Outreach Note  09/07/2022 Name: Terri [name] MRN: [id number] DOB: 1985-09-20  Referred by: Eppie Gibson, MD Reason for referral : Appointment (I called the patient today to get her scheduled with the MM Care Management Team. I left my name and number on her VM.)   An unsuccessful telephone outreach was attempted today. The patient was referred to the case management team for assistance with care management and care coordination.   Follow Up Plan: A HIPAA compliant phone message was left for the patient providing contact information and requesting a return call.  The care management team will reach out to the patient again over the next 7-14 days.   [place]  [phone]

## 2022-09-11 ENCOUNTER — Encounter (HOSPITAL_COMMUNITY): Payer: Self-pay | Admitting: Nurse Practitioner

## 2022-09-11 ENCOUNTER — Telehealth: Payer: Self-pay | Admitting: Student

## 2022-09-11 ENCOUNTER — Ambulatory Visit: Payer: Medicaid Other

## 2022-09-11 NOTE — Telephone Encounter (Signed)
The patient has been notified of the result. Left detailed message on voicemail and informed patient to call back..Maxen Rowland Green, CMA   

## 2022-09-11 NOTE — Telephone Encounter (Signed)
**  After Hours/ Emergency Line Call**  Received a page to call [phone].  Patient: Terri [name]  Caller: Self  Confirmed name & DOB of patient with caller  Subjective:  Is calling because she is appreciating foot swelling that has just started, and never been an issue before. She is aware it could be the amlodipine, but wanted to call and see if this was an emergency. She notes she missed a dose yesterday, because she planned to drink alcohol, but took it today. She was concerned that the alcohol caused her foot swelling. She is not in any distress and feels perfectly fine. She denies any chest pain or shortness of breath.     Objective:  Observations: NAD    Assessment & Plan  Terri [name] is a 37 y.o. female with PMHx s/f HTN who calls with the following complaints and concerns of swelling in her feet. Patient likely has swelling from amlodipine and needs to be switched off. She is open to being seen in access to care tomorrow, and appointment was made. She denies any chest pain or dyspnea.   Recommendations:  Discuss switching med's with doctor on access to care tomorrow.    -- Will forward to PCP.  Terri Bouche, MD [facility], PGY-1

## 2022-09-11 NOTE — Progress Notes (Deleted)
  SUBJECTIVE:   CHIEF COMPLAINT / HPI:   ***  PERTINENT  PMH / PSH: ***  Past Medical History:  Diagnosis Date   Asthma    Chronic hypertension with superimposed severe preeclampsia 01/31/2019   Hypertension    Hypertention, malignant, with acute intensive management    Supervision of other high risk pregnancy, antepartum 01/07/2019   .[name] Nursing Staff Provider Office Location  [facility] Dating  LMP Language  English Anatomy US  Incomplete but wnl, f/u wnl Flu Vaccine  Declined Genetic Screen  NIPS:   AFP:   First Screen:  Quad:   TDaP vaccine    Hgb A1C or  GTT Early  Third trimester nl 2 hour Rhogam     LAB RESULTS  Feeding Plan Breast & Bottle Blood Type O/Positive/-- (08/13 1132)  Contraception Declined Antibody Negative (08/13    Patient Care Team: Eppie Gibson, MD as PCP - General (Family Medicine) Elouise Munroe, MD as PCP - Cardiology (Cardiology) OBJECTIVE:  There were no vitals taken for this visit. Physical Exam   ASSESSMENT/PLAN:  There are no diagnoses linked to this encounter. No follow-ups on file. Wells Guiles, DO 09/11/2022, 8:18 AM PGY-***, [facility] {    This will disappear when note is signed, click to select method of visit    :1}

## 2022-09-11 NOTE — Telephone Encounter (Signed)
Patient walked in for an appointment.  Had to reschedule.  Want to know if she should take Amlodipine 5 mg or the 10 mg.  Please call [phone]

## 2022-09-12 ENCOUNTER — Ambulatory Visit: Payer: Self-pay

## 2022-09-12 ENCOUNTER — Ambulatory Visit: Payer: Medicaid Other | Attending: Nurse Practitioner | Admitting: Nurse Practitioner

## 2022-09-12 NOTE — Progress Notes (Deleted)
Office Visit    Patient Name: Terri [name] Date of Encounter: 09/12/2022  Primary Care Provider:  Eppie Gibson, MD Primary Cardiologist:  Elouise Munroe, MD  Chief Complaint    37 year old female with a history of PSVT, hypertension, and asthma who presents for hospital follow-up related to SVT.   Past Medical History    Past Medical History:  Diagnosis Date   Asthma    Chronic hypertension with superimposed severe preeclampsia 01/31/2019   Hypertension    Hypertention, malignant, with acute intensive management    Supervision of other high risk pregnancy, antepartum 01/07/2019   .[name] Nursing Staff Provider Office Location  [facility] Dating  LMP Language  English Anatomy US  Incomplete but wnl, f/u wnl Flu Vaccine  Declined Genetic Screen  NIPS:   AFP:   First Screen:  Quad:   TDaP vaccine    Hgb A1C or  GTT Early  Third trimester nl 2 hour Rhogam     LAB RESULTS  Feeding Plan Breast & Bottle Blood Type O/Positive/-- (08/13 1132)  Contraception Declined Antibody Negative (08/13   No past surgical history on file.  Allergies  No Known Allergies   Labs/Other Studies Reviewed    The following studies were reviewed today: Echo 07/20/2022: IMPRESSIONS    1. Left ventricular ejection fraction, by estimation, is 55 to 60%. Left  ventricular ejection fraction by 3D volume is 52 %. The left ventricle has  normal function. The left ventricle has no regional wall motion  abnormalities. There is moderate  concentric left ventricular hypertrophy of the basal-septal segment. Left  ventricular diastolic parameters are consistent with Grade II diastolic  dysfunction (pseudonormalization). The average left ventricular global  longitudinal strain is -15.7 %. The  global longitudinal strain is abnormal.   2. Right ventricular systolic function is normal. The right ventricular  size is normal.   3. A small pericardial effusion is present. The pericardial effusion is  anterior to the  right ventricle.   4. The mitral valve is normal in structure. No evidence of mitral valve  regurgitation. No evidence of mitral stenosis.   5. The aortic valve is tricuspid. Aortic valve regurgitation is not  visualized. No aortic stenosis is present.   6. The inferior vena cava is normal in size with greater than 50%  respiratory variability, suggesting right atrial pressure of 3 mmHg.   Comparison(s): No prior Echocardiogram.    Recent Labs: 07/19/2022: TSH 0.947 07/20/2022: ALT 13; B Natriuretic Peptide 87.7; Magnesium 2.3 08/14/2022: BUN 12; Creatinine, Ser 0.83; Hemoglobin 12.8; Platelets 333; Potassium 3.5; Sodium 137  Recent Lipid Panel No results found for: "CHOL", "TRIG", "HDL", "CHOLHDL", "VLDL", "LDLCALC", "LDLDIRECT"  History of Present Illness    37 year old female with the above past medical history including PSVT, hypertension, and asthma.   She presented to the ED on 07/19/2022 in the setting of palpitations.  She noticed her heart rate was approximately 130 bpm, she felt short of breath.  EMS was called.  She was noted to be in SVT.  She received adenosine and Cardizem with restoration of NSR.  She did note some mild residual chest heaviness following this episode.  Troponin was elevated.  Cardiology was consulted.  Her chest pain resolved.  Echocardiogram showed EF 55 to 60%, normal LV function, no RWMA, moderate concentric LVH, G2 DD, normal RV systolic function, small pericardial effusion, no significant valvular abnormalities.  Further ischemic evaluation was deferred given overall normal echocardiogram.  She was started  on metoprolol. She did present with hypokalemia, HCTZ was discontinued and she was started on amlodipine. She also reported a history of nighttime apnea and snoring.  Outpatient sleep study was recommended.  She was last seen in the office on 07/27/2022 and was stable from a cardiac standpoint.  She noted ongoing elevated BP, some mild fatigue.  Amlodipine was  increased to 10 mg daily.  ITAMAR sleep study was positive for sleep apnea. She did not tolerate metoprolol due to symptoms of anxiety, worsening palpitations, insomnia, and jitteriness and was subsequently started on diltiazem.  She presented to the ED on 08/14/2022 in the setting of palpitations.  She had not yet started her diltiazem at the time.  She was advised to start taking diltiazem and follow-up with cardiology as an outpatient.  7-day ZIO monitor was ordered.  She presents today for follow-up accompanied by her 54-year-old son. Since her hospitalization she has been    1. PSVT: She notes intermittent fleeting palpitations. She has had some fatigue since starting metoprolol.  Continue to monitor symptoms.  I encouraged increase activity as tolerated, however, will confirm this is ok with Dr. Margaretann Loveless.  If palpitations persist, consider titration of beta-blocker therapy.  If she is unable to tolerate increased dose of beta-blocker, and continues to have significant symptoms, she would likely benefit from outpatient cardiac monitor, possible EP referral.  Reviewed ED precautions, vagal maneuvers. Continue metoprolol.   Addendum 07/31/2022: Per Dr. Margaretann Loveless, pt ok to resume full exercise.  Patient notified of recommendations.  Patient was advised to report any new symptoms of chest pain, palpitations, dizziness, shortness of breath.   2. Elevated troponin: Likely in the setting of demand ischemia/SVT.  She denies symptoms concerning for angina.  Recent echo was overall normal.  No indication for ischemic evaluation at this time.   3. Pericardial effusion: Small pericardial effusion noted on most recent echo, asymptomatic. Will repeat limited echo in 1 month to see if this has resolved.    4. Hypertension: BP remains elevated above goal.  Will increase amlodipine to 10 mg daily.  We discussed the possible side effect of lower extremity swelling with increased dosing.  I advised her to continue to monitor  BP and report BP consistently greater than 130/80.  Otherwise, continue current antihypertensive regimen.   5. History of snoring/concern for OSA: She notes a history of loud snoring, nonrestorative sleep, and apneic periods while sleeping.  STOP-BANG of 5, Epworth scale of 8.  Will arrange for [facility] home sleep study.   6. Disposition: Follow-up in 6 to 8 Home Medications    Current Outpatient Medications  Medication Sig Dispense Refill   amLODipine (NORVASC) 10 MG tablet Take 1 tablet (10 mg total) by mouth daily. 90 tablet 3   Blood Pressure KIT Use to check blood pressure as instructed by your physician 1 kit 0   diltiazem (CARDIZEM) 30 MG tablet Take 1 tablet (30 mg total) by mouth 4 (four) times daily as needed (palpitations). 30 tablet 4   No current facility-administered medications for this visit.     Review of Systems    ***.  All other systems reviewed and are otherwise negative except as noted above.    Physical Exam    VS:  There were no vitals taken for this visit. , BMI There is no height or weight on file to calculate BMI. STOP-Bang Score:  5  { Consider Dx Sleep Disordered Breathing or Sleep Apnea  ICD G47.33          :  1}    GEN: Well nourished, well developed, in no acute distress. HEENT: normal. Neck: Supple, no JVD, carotid bruits, or masses. Cardiac: RRR, no murmurs, rubs, or gallops. No clubbing, cyanosis, edema.  Radials/DP/PT 2+ and equal bilaterally.  Respiratory:  Respirations regular and unlabored, clear to auscultation bilaterally. GI: Soft, nontender, nondistended, BS + x 4. MS: no deformity or atrophy. Skin: warm and dry, no rash. Neuro:  Strength and sensation are intact. Psych: Normal affect.  Accessory Clinical Findings    ECG personally reviewed by me today - *** - no acute changes.   Lab Results  Component Value Date   WBC 5.3 08/14/2022   HGB 12.8 08/14/2022   HCT 38.8 08/14/2022   MCV 87.8 08/14/2022   PLT 333 08/14/2022   Lab  Results  Component Value Date   CREATININE 0.83 08/14/2022   BUN 12 08/14/2022   NA 137 08/14/2022   K 3.5 08/14/2022   CL 103 08/14/2022   CO2 26 08/14/2022   Lab Results  Component Value Date   ALT 13 07/20/2022   AST 17 07/20/2022   ALKPHOS 66 07/20/2022   BILITOT 0.5 07/20/2022   No results found for: "CHOL", "HDL", "LDLCALC", "LDLDIRECT", "TRIG", "CHOLHDL"  No results found for: "HGBA1C"  Assessment & Plan    1.  ***  No BP recorded.  {Refresh Note OR Click here to enter BP  :1}***   Lenna Sciara, NP 09/12/2022, 6:48 AM

## 2022-09-12 NOTE — Telephone Encounter (Signed)
Attempted to return patient call. No answer, unable to leave VM. Pt no-showed ATC appt this morning. Does have cardiology follow-up this afternoon. Would defer amlodipine dosing and edema evaluation to cardiology since they will be able to physically examine patient.  Marnee Guarneri, MD

## 2022-09-21 ENCOUNTER — Other Ambulatory Visit: Payer: Medicaid Other | Admitting: *Deleted

## 2022-09-21 NOTE — Patient Instructions (Signed)
Visit Information  Ms. MAKALEY PESTER  - as a part of your Medicaid benefit, you are eligible for care management and care coordination services at no cost or copay. I was unable to reach you by phone today but would be happy to help you with your health related needs. Please feel free to call me @ [phone].   A member of the Managed Medicaid care management team will reach out to you again over the next 7 days.   Estanislado Emms RN, BSN [place]  Managed [facility] RN Care Coordinator [phone]

## 2022-09-21 NOTE — Patient Outreach (Signed)
  Medicaid Managed Care   Unsuccessful Attempt Note   09/21/2022 Name: Terri [name] MRN: [id number] DOB: 16-Sep-1985  Referred by: Alicia Amel, MD Reason for referral : High Risk Managed Medicaid (Unsuccessful RNCM initial telephone outreach)   An unsuccessful telephone outreach was attempted today. The patient was referred to the case management team for assistance with care management and care coordination.    Follow Up Plan: A HIPAA compliant phone message was left for the patient providing contact information and requesting a return call. and The Managed Medicaid care management team will reach out to the patient again over the next 7 days.    Estanislado Emms RN, BSN [place]  Managed [facility] RN Care Coordinator [phone]

## 2022-09-29 ENCOUNTER — Telehealth: Payer: Self-pay | Admitting: Student

## 2022-09-29 ENCOUNTER — Other Ambulatory Visit: Payer: Self-pay | Admitting: Student

## 2022-09-29 DIAGNOSIS — I10 Essential (primary) hypertension: Secondary | ICD-10-CM

## 2022-09-29 MED ORDER — AMLODIPINE BESYLATE 5 MG PO TABS: 5.0000 mg | ORAL_TABLET | Freq: Every day | ORAL | 0 refills | Status: DC

## 2022-09-29 NOTE — Telephone Encounter (Signed)
Received after hours page:  Called pt who states she missed a recent appointment. States she stopped taking Amlodipine 10 mg which was prescribed by cardiologist in February d/t leg swelling and reduced to 5 mg daily (has been breaking the 10 mg in half).  After reducing the dose, BLE swelling resolved. States she has been checking her blood pressures and they have been fine but she is unable to give me numbers. States she has been feeling great on the 5 mg.  She ran out of her medications today and is requesting a refill.  Advised patient I will send in a 30-day supply but that she will need to make an appointment before the next refill.  Patient expresses understanding and states she plans to call the office on Monday to schedule an appointment.

## 2022-10-02 ENCOUNTER — Other Ambulatory Visit: Payer: Medicaid Other | Admitting: *Deleted

## 2022-10-02 NOTE — Patient Outreach (Signed)
  Medicaid Managed Care   Unsuccessful Attempt Note   10/02/2022 Name: Terri [name] MRN: [id number] DOB: Dec 23, 1985  Referred by: Alicia Amel, MD Reason for referral : High Risk Managed Medicaid (Unsuccessful RNCM initial telephone outreach)   A second unsuccessful telephone outreach was attempted today. The patient was referred to the case management team for assistance with care management and care coordination.    Follow Up Plan: A HIPAA compliant phone message was left for the patient providing contact information and requesting a return call. and The Managed Medicaid care management team will reach out to the patient again over the next 7 days.    Estanislado Emms RN, BSN [place]  Managed [facility] RN Care Coordinator [phone]

## 2022-10-02 NOTE — Patient Instructions (Signed)
Visit Information  [name]  - as a part of your Medicaid benefit, you are eligible for care management and care coordination services at no cost or copay. I was unable to reach you by phone today but would be happy to help you with your health related needs. Please feel free to call me @ [phone].   A member of the Managed Medicaid care management team will reach out to you again over the next 7 days.   Judythe Postema RN, BSN [place]  Managed Medicaid RN Care Coordinator [phone]   

## 2022-10-09 ENCOUNTER — Telehealth: Payer: Self-pay

## 2022-10-09 NOTE — Telephone Encounter (Signed)
..   Medicaid Managed Care   Unsuccessful Outreach Note  10/09/2022 Name: Terri [name] MRN: [id number] DOB: July 18, 1985  Referred by: Alicia Amel, MD Reason for referral : Appointment   Third unsuccessful telephone outreach was attempted today. The patient was referred to the case management team for assistance with care management and care coordination. The patient's primary care provider has been notified of our unsuccessful attempts to make or maintain contact with the patient. The care management team is pleased to engage with this patient at any time in the future should he/she be interested in assistance from the care management team.   Follow Up Plan: We have been unable to make contact with the patient for follow up. The care management team is available to follow up with the patient after provider conversation with the patient regarding recommendation for care management engagement and subsequent re-referral to the care management team.   .j

## 2022-11-05 ENCOUNTER — Other Ambulatory Visit: Payer: Self-pay | Admitting: Student

## 2022-11-05 DIAGNOSIS — I10 Essential (primary) hypertension: Secondary | ICD-10-CM

## 2022-11-16 ENCOUNTER — Ambulatory Visit (INDEPENDENT_AMBULATORY_CARE_PROVIDER_SITE_OTHER): Payer: Medicaid Other | Admitting: Family Medicine

## 2022-11-16 ENCOUNTER — Encounter: Payer: Self-pay | Admitting: Family Medicine

## 2022-11-16 VITALS — BP 164/103 | HR 95 | Ht 68.0 in | Wt 276.2 lb

## 2022-11-16 DIAGNOSIS — R21 Rash and other nonspecific skin eruption: Secondary | ICD-10-CM

## 2022-11-16 DIAGNOSIS — L659 Nonscarring hair loss, unspecified: Secondary | ICD-10-CM

## 2022-11-16 DIAGNOSIS — I10 Essential (primary) hypertension: Secondary | ICD-10-CM

## 2022-11-16 DIAGNOSIS — I471 Supraventricular tachycardia, unspecified: Secondary | ICD-10-CM

## 2022-11-16 DIAGNOSIS — G43709 Chronic migraine without aura, not intractable, without status migrainosus: Secondary | ICD-10-CM | POA: Diagnosis not present

## 2022-11-16 MED ORDER — AMLODIPINE BESYLATE-VALSARTAN 5-160 MG PO TABS: 1.0000 | ORAL_TABLET | Freq: Every day | ORAL | 1 refills | Status: DC

## 2022-11-16 MED ORDER — TRIAMCINOLONE ACETONIDE 0.1 % EX CREA: 1.0000 | TOPICAL_CREAM | Freq: Two times a day (BID) | CUTANEOUS | 0 refills | Status: DC

## 2022-11-16 MED ORDER — SUMATRIPTAN SUCCINATE 50 MG PO TABS: 50.0000 mg | ORAL_TABLET | ORAL | 1 refills | Status: DC | PRN

## 2022-11-16 NOTE — Patient Instructions (Signed)
It was wonderful to see you today. Thank you for allowing me to be a part of your care. Below is a short summary of what we discussed at your visit today:  Blood pressure STOP amlodipine by itself.  START the combination pill with amlodipine 5 mg and valsartan 160 mg.  Follow up with your cardiologist in one week.  You will blood pressure is better and they collect a lab called a BMP to check your creatinine and potassium, excellent.  No need to follow-up with [place]. If your blood pressure is better but they do not collect any blood work, please come back to our clinic for a simple blood test.  This can be a lab only appointment.  Simply call ahead. Blood pressures still not well-controlled, and they may change some things.  They will keep [place] apprised of any changes.  Migraines I suspect these will get better as we improve your blood pressure.  Try to use over-the-counter medication such as Tylenol or Excedrin relief as your first-line approach to stop migraines.  I have written you prescription for a medication called sumatriptan.  This stops migraines after they have started.  Take it at the first sign of migraine, you may repeat in a couple of hours if the migraine is still there.  Do not take more than 2 tablets in 24 hours.  Rash Apply the triamcinolone ointment to the area twice daily for 7 to 14 days.  It looks like a contact irritant is causing the rash.  If the rash gets worse or simply does not respond, return for care in a couple of weeks.  We may need to do further examination such as looking at things under the microscope or taking a biopsy.   Please bring all of your medications to every appointment! If you have any questions or concerns, please do not hesitate to contact us via phone or MyChart message.   Fayette Pho, MD

## 2022-11-16 NOTE — Progress Notes (Unsigned)
SUBJECTIVE:   CHIEF COMPLAINT / HPI:   BP  Current medications include amlodipine 5 mg 4/19 called into nurse line saying amlodipine had been reduced from 10 mg to 5 mg due to leg swelling Concerned about worsening hair loss on amlodipine - wonders if this is a side effect? Also prescribed diltiazem 30 mg tablet 4 times daily as needed for palpitations by Dr. 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 Something to do with her iron per patient? Suspect more so to do with potassium Was sent a heart monitor last week, has not started yet (just got it) Home sleep study previously done, does not yet know results, cardiology appt next week to discuss  BP Readings from Last 3 Encounters:  08/14/22 122/87  07/27/22 (!) 138/100  07/20/22 (!) 133/99    Migraines Previously treated with imitrex No current meds on file Was previously on metoprolol but was taken off by cardiology (patient reports it caused really bad anxiety, couldn't sleep) More frequent with BP being high Has changed habits, drinking more water, getting more sleep but no effect on migraines Usually relieved with Tylenol and sleep  Rash on arm Left forearm One month duration Tried cortisone cream, dermasmooth cream, diluted bleach Pruritic rash but not painful  Switched detergent recently, but that's the only change  PERTINENT  PMH / PSH:  Patient Active Problem List   Diagnosis Date Noted   Hair loss 11/17/2022   Rash 11/17/2022   Atypical chest pain 07/20/2022   Elevated troponin 07/20/2022   Hypokalemia 07/20/2022   SVT (supraventricular  tachycardia) 07/19/2022   Obesity, morbid, BMI 40.0-49.9 (HCC) 10/01/2020   Postpartum depression 08/20/2019   Migraine 11/12/2014   Mixed incontinence 10/03/2013   Unspecified episodic mood disorder 04/05/2011   Essential hypertension 03/21/2011   TOBACCO USER 06/14/2009   Obesity in pregnancy, antepartum 01/05/2009   ASTHMA, INTERMITTENT 08/09/2006    OBJECTIVE:   BP (!) 164/103   Pulse 95   Ht 5\' 8"  (1.727 m)   Wt 276 lb 3.2 oz (125.3 kg)   LMP 11/14/2022   SpO2 98%   BMI 42.00 kg/m    PHQ-9:     05/22/2022    2:05 PM 10/01/2020    3:08 PM 04/17/2017    9:44 AM  Depression screen PHQ 2/9  Decreased Interest 2 2 0  Down, Depressed, Hopeless 2 2 0  PHQ - 2 Score 4 4 0  Altered sleeping 3    Tired, decreased energy 3 2   Change in appetite 1 2   Feeling bad or failure about yourself  0 0   Trouble concentrating 1 0   Moving slowly or fidgety/restless 0 0   Suicidal thoughts 0 0   PHQ-9 Score 12    Difficult doing work/chores  Somewhat difficult     Physical Exam General: Awake, alert, oriented Cardiovascular: Regular rate and rhythm, S1 and S2 present, no murmurs auscultated Respiratory: Lung fields clear to auscultation bilaterally Skin : Scattering of small white-ish colored papules and macules over left forearm, appear to be concentrated at the follicle base, minimal surrounding  erythema, no heat or swelling  ASSESSMENT/PLAN:   Essential hypertension Uncontrolled today on amlodipine 5 mg monotherapy.  Will start commendation therapy with amlodipine-valsartan 5-160 mg tablet daily.  Has follow-up with cardiology in 1 week.  Discussed with patient that she needs BP recheck in about a week with BMP for creatinine and potassium.  BMP ordered as future lab.  If she gets in with cardiology and her BP is controlled and then get the BMP, no need to come to [place].  If cardiology finds her BP is controlled but they did not get labs, patient will return to clinic for lab only  visit for BMP.  Suspect cardiology will make changes if uncontrolled BP.  Migraine No red flags on HPI.  Physical exam reassuring.  Given migraines are usually resolved with Tylenol and sleep, believe that we may simply need to get her blood pressure under control other than starting preventative medication at this time.  Rx sumatriptan for use when Tylenol, Motrin, or Excedrin Migraine not useful.  Discussed how to use, instructions included in AVS as well.  Discussed with patient that I suspect this will improve significantly once we get her blood pressure under control.  Return precautions given, see AVS for more.  SVT (supraventricular tachycardia) Could consider propranolol in the future for both migraine prevention and SVT treatment.  However, cardiology took patient off previous prescription of metoprolol given patient reported terrible anxiety and insomnia with this.  Something to think about in the future.  Rash Nonconcerning physical exam and vitals.  Appears to be contact dermatitis.  Trial triamcinolone cream 0.1%.     Fayette Pho, MD [facility]

## 2022-11-17 DIAGNOSIS — L659 Nonscarring hair loss, unspecified: Secondary | ICD-10-CM | POA: Insufficient documentation

## 2022-11-17 DIAGNOSIS — R21 Rash and other nonspecific skin eruption: Secondary | ICD-10-CM | POA: Insufficient documentation

## 2022-11-17 NOTE — Assessment & Plan Note (Signed)
Uncontrolled today on amlodipine 5 mg monotherapy.  Will start commendation therapy with amlodipine-valsartan 5-160 mg tablet daily.  Has follow-up with cardiology in 1 week.  Discussed with patient that she needs BP recheck in about a week with BMP for creatinine and potassium.  BMP ordered as future lab.  If she gets in with cardiology and her BP is controlled and then get the BMP, no need to come to [place].  If cardiology finds her BP is controlled but they did not get labs, patient will return to clinic for lab only visit for BMP.  Suspect cardiology will make changes if uncontrolled BP.

## 2022-11-17 NOTE — Assessment & Plan Note (Addendum)
Nonconcerning physical exam and vitals.  Appears to be contact dermatitis.  Trial triamcinolone cream 0.1%. Return precautions given.

## 2022-11-17 NOTE — Assessment & Plan Note (Signed)
No red flags on HPI.  Physical exam reassuring.  Given migraines are usually resolved with Tylenol and sleep, believe that we may simply need to get her blood pressure under control other than starting preventative medication at this time.  Rx sumatriptan for use when Tylenol, Motrin, or Excedrin Migraine not useful.  Discussed how to use, instructions included in AVS as well.  Discussed with patient that I suspect this will improve significantly once we get her blood pressure under control.  Return precautions given, see AVS for more.

## 2022-11-17 NOTE — Assessment & Plan Note (Signed)
Could consider propranolol in the future for both migraine prevention and SVT treatment.  However, cardiology took patient off previous prescription of metoprolol given patient reported terrible anxiety and insomnia with this.  Something to think about in the future.

## 2022-11-18 NOTE — Assessment & Plan Note (Signed)
Suspect telogen effluvium, unclear if due to medication changes or previous hospitalization. Simply mentioned today, not fully addressed, will need to return for further work up.

## 2022-11-21 ENCOUNTER — Ambulatory Visit: Payer: Medicaid Other | Attending: Internal Medicine | Admitting: Internal Medicine

## 2022-11-21 ENCOUNTER — Encounter: Payer: Self-pay | Admitting: Internal Medicine

## 2022-11-21 VITALS — BP 138/100 | HR 86 | Ht 68.0 in | Wt 278.4 lb

## 2022-11-21 DIAGNOSIS — I10 Essential (primary) hypertension: Secondary | ICD-10-CM | POA: Diagnosis not present

## 2022-11-21 DIAGNOSIS — Z6841 Body Mass Index (BMI) 40.0 and over, adult: Secondary | ICD-10-CM | POA: Diagnosis not present
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
## 2022-11-21 NOTE — Patient Instructions (Addendum)
Medication Instructions:  No Changes In Medications at this time.  *If you need a refill on your cardiac medications before your next appointment, please call your pharmacy*  Lab Work: None Ordered At This Time.  If you have labs (blood work) drawn today and your tests are completely normal, you will receive your results only by: MyChart Message (if you have MyChart) OR A paper copy in the mail If you have any lab test that is abnormal or we need to change your treatment, we will call you to review the results.  Testing/Procedures: Your physician has requested that you have a renal artery duplex. During this test, an ultrasound is used to evaluate blood flow to the kidneys. Allow one hour for this exam. Do not eat after midnight the day before and avoid carbonated beverages. Take your medications as you usually do.  Follow-Up: At [facility], you and your health needs are our priority.  As part of our continuing mission to provide you with exceptional heart care, we have created designated Provider Care Teams.  These Care Teams include your primary Cardiologist (physician) and Advanced Practice Providers (APPs -  Physician Assistants and Nurse Practitioners) who all work together to provide you with the care you need, when you need it.  NEXT AVAILABLE FOR CVRR FOR WEIGHT LOSS   REFERRAL TO Dr. Duke Salvia FOR HYPERTENSION NEXT AVAILABLE  MESSAGE SENT TO SLEEP DEPARTMENT ABOUT SLEEP STUDY   Your next appointment:   6 month(s) WITH APP   THEN 1 year    Provider:   Parke Poisson, MD

## 2022-12-04 NOTE — Addendum Note (Signed)
Addended by: Reesa Chew on: 12/04/2022 12:08 PM   Modules accepted: Orders

## 2022-12-04 NOTE — Telephone Encounter (Signed)
The patient has been notified of the result and verbalized understanding.  All questions (if any) were answered. Latrelle Dodrill, CMA 12/04/2022 12:01 PM    Will precert titrat

## 2022-12-07 ENCOUNTER — Ambulatory Visit (HOSPITAL_COMMUNITY)
Admission: RE | Admit: 2022-12-07 | Payer: No Typology Code available for payment source | Source: Ambulatory Visit | Attending: Internal Medicine | Admitting: Internal Medicine

## 2022-12-08 ENCOUNTER — Ambulatory Visit (HOSPITAL_COMMUNITY): Admission: RE | Admit: 2022-12-08 | Payer: No Typology Code available for payment source | Source: Ambulatory Visit

## 2022-12-12 ENCOUNTER — Other Ambulatory Visit: Payer: Self-pay

## 2022-12-12 ENCOUNTER — Ambulatory Visit (INDEPENDENT_AMBULATORY_CARE_PROVIDER_SITE_OTHER): Payer: Medicaid Other | Admitting: Student

## 2022-12-12 ENCOUNTER — Encounter: Payer: Self-pay | Admitting: Student

## 2022-12-12 VITALS — BP 148/106 | HR 88 | Ht 68.0 in | Wt 286.0 lb

## 2022-12-12 DIAGNOSIS — I10 Essential (primary) hypertension: Secondary | ICD-10-CM

## 2022-12-12 DIAGNOSIS — G4733 Obstructive sleep apnea (adult) (pediatric): Secondary | ICD-10-CM | POA: Diagnosis not present

## 2022-12-12 NOTE — Patient Instructions (Addendum)
We will look into getting your renal duplex ultrasound done.  I have ordered your CPAP.  CONDOMS. CONDOMS. CONDOMS.  I'm going to check your kidney function, if it is okay, I'm going to send in a higher dose of the valsartan. Dr. Jacques Navy referred to the advanced HTN clinic. I think this is good for you. Be expecting a call from them.   I'd like to check another lab that can only be collected in the morning. I will place a future order and please come by sometime around 8:30 one day this week or next to have this collected.    Eliezer Mccoy, MD

## 2022-12-12 NOTE — Progress Notes (Unsigned)
    SUBJECTIVE:   CHIEF COMPLAINT / HPI:   She was recently started on Amlodipine-Valsartan 5-160mg  daily. She did not tolerate amlodipine 10mg  due to LE Edema. She has previously been intolerant of hydrochlorothiazide due to recurrent hypokalemia.  Panic s\x with B blocker.   *** Tells me no chance of pregnancy. Using condoms and will plan for day after pill should she have unprotected sex.     Awaiting echo. Awaiting renal duplex US.     Her cardiologist has ordered a renal artery duplex and referred her to [facility] Advanced HTN clinic.  PERTINENT  PMH / PSH: ***  OBJECTIVE:   BP (!) 148/106   Pulse 88   Ht 5\' 8"  (1.727 m)   Wt 286 lb (129.7 kg)   LMP 11/14/2022   SpO2 100%   BMI 43.49 kg/m   ***  ASSESSMENT/PLAN:   No problem-specific Assessment & Plan notes found for this encounter.     Eliezer Mccoy, MD [facility]

## 2022-12-13 ENCOUNTER — Other Ambulatory Visit: Payer: Medicaid Other

## 2022-12-13 DIAGNOSIS — I10 Essential (primary) hypertension: Secondary | ICD-10-CM | POA: Diagnosis not present

## 2022-12-13 NOTE — Assessment & Plan Note (Signed)
Difficult to control.  Has been on amlodipine-valsartan 5-160 mg daily for about a month now.  Has already been referred to advanced hypertension clinic which I think is quite appropriate. -Reiterated the importance of contraception of today, given a bag of condoms -Repeat BMP -Will also obtain a renal and aldosterone ratio tomorrow morning -If renal and aldosterone ratio is abnormal, consider addition of spironolactone, otherwise as long as her renal function is okay, will increase valsartan component to 320 mg daily -Agree with referral to advanced hypertension clinic -We will assist with scheduling of her renal duplex ultrasound -I have ordered her CPAP

## 2022-12-14 LAB — BASIC METABOLIC PANEL
BUN/Creatinine Ratio: 12 (ref 9–23)
BUN: 10 mg/dL (ref 6–20)
CO2: 22 mmol/L (ref 20–29)
Calcium: 8.9 mg/dL (ref 8.7–10.2)
Chloride: 101 mmol/L (ref 96–106)
Creatinine, Ser: 0.84 mg/dL (ref 0.57–1.00)
Glucose: 151 mg/dL — ABNORMAL HIGH (ref 70–99)
Potassium: 4.2 mmol/L (ref 3.5–5.2)
Sodium: 136 mmol/L (ref 134–144)
eGFR: 92 mL/min/{1.73_m2} (ref >59)

## 2022-12-18 ENCOUNTER — Telehealth: Payer: Self-pay

## 2022-12-18 DIAGNOSIS — I10 Essential (primary) hypertension: Secondary | ICD-10-CM

## 2022-12-18 LAB — ALDOSTERONE + RENIN ACTIVITY W/ RATIO
Aldos/Renin Ratio: 16.3 (ref 0.0–30.0)
Aldosterone: 14.9 ng/dL (ref 0.0–30.0)
Renin Activity, Plasma: 0.915 ng/mL/hr (ref 0.167–5.380)

## 2022-12-18 NOTE — Telephone Encounter (Signed)
Please see below message from [employer].   Hello Dahlia Client,  Order has been received, However, I need to have the order updated to show the settings. Once we received the updated order we will continue to process this request.  Thank you,  Brad New     Veronda Prude, RN

## 2022-12-18 NOTE — Telephone Encounter (Signed)
Receipt confirmed by [employer].   Aneri Slagel C Majestic Molony, RN  

## 2022-12-18 NOTE — Telephone Encounter (Signed)
Community message sent to [employer] for CPAP machine.   Will await response.   Anjali Manzella C Clemmie Marxen, RN  

## 2022-12-19 ENCOUNTER — Other Ambulatory Visit (HOSPITAL_COMMUNITY): Payer: Self-pay

## 2022-12-19 ENCOUNTER — Ambulatory Visit: Payer: Medicaid Other | Attending: Cardiology | Admitting: Pharmacist

## 2022-12-19 ENCOUNTER — Encounter: Payer: Self-pay | Admitting: Pharmacist

## 2022-12-19 VITALS — Ht 68.0 in | Wt 286.2 lb

## 2022-12-19 DIAGNOSIS — Z6841 Body Mass Index (BMI) 40.0 and over, adult: Secondary | ICD-10-CM | POA: Diagnosis not present

## 2022-12-19 DIAGNOSIS — I10 Essential (primary) hypertension: Secondary | ICD-10-CM

## 2022-12-19 NOTE — Telephone Encounter (Signed)
Community message sent to [employer] with update.   Zyiah Withington C Rolando Whitby, RN  

## 2022-12-19 NOTE — Addendum Note (Signed)
Addended by: Darnelle Spangle B on: 12/19/2022 07:06 AM   Modules accepted: Orders
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
## 2022-12-19 NOTE — Patient Instructions (Addendum)
It was nice meeting you today  We will check your hemoglobin A1c to confirm if you are diabetic   I have also printed a patient assistance application for you which I will send in to the manufacturer  Please send me a message or call when you get home with what you have in stock  Laural Golden, PharmD, [place], CDCES, CPP [address] [place], [place], [address] Phone: [phone] , Fax: [phone]

## 2022-12-20 ENCOUNTER — Ambulatory Visit (HOSPITAL_COMMUNITY)
Admission: RE | Admit: 2022-12-20 | Discharge: 2022-12-20 | Disposition: A | Discharge status: Home / Self Care | Payer: Medicaid Other | Source: Ambulatory Visit | Attending: Internal Medicine | Admitting: Internal Medicine

## 2022-12-20 ENCOUNTER — Other Ambulatory Visit (HOSPITAL_COMMUNITY): Payer: Self-pay

## 2022-12-20 ENCOUNTER — Telehealth: Payer: Self-pay

## 2022-12-20 ENCOUNTER — Telehealth: Payer: Self-pay | Admitting: Pharmacist

## 2022-12-20 DIAGNOSIS — E119 Type 2 diabetes mellitus without complications: Secondary | ICD-10-CM

## 2022-12-20 DIAGNOSIS — I10 Essential (primary) hypertension: Secondary | ICD-10-CM | POA: Insufficient documentation

## 2022-12-20 LAB — HEMOGLOBIN A1C
Est. average glucose Bld gHb Est-mCnc: 148 mg/dL
Hgb A1c MFr Bld: 6.8 % — ABNORMAL HIGH (ref 4.8–5.6)

## 2022-12-20 MED ORDER — OZEMPIC (0.25 OR 0.5 MG/DOSE) 2 MG/3ML ~~LOC~~ SOPN
PEN_INJECTOR | SUBCUTANEOUS | 0 refills | Status: DC
Start: 2022-12-20 — End: 2023-02-07

## 2022-12-20 MED ORDER — AMLODIPINE BESYLATE-VALSARTAN 10-320 MG PO TABS
1.0000 | ORAL_TABLET | Freq: Every day | ORAL | 5 refills | Status: DC
Start: 2022-12-20 — End: 2022-12-28

## 2022-12-20 NOTE — Addendum Note (Signed)
Addended by: Darnelle Spangle B on: 12/20/2022 08:45 AM   Modules accepted: Orders

## 2022-12-20 NOTE — Telephone Encounter (Signed)
Contacted patient regarding elevated A1c. Follow up scheduled to discuss diet and lifestyle changes. Will complete PA for Ozempic.

## 2022-12-20 NOTE — Telephone Encounter (Signed)
Pharmacy Patient Advocate Encounter  Prior Authorization for Ozempic has been APPROVED by [employer] from 7.10.24 to 7.10.25.    Key: [id number]

## 2022-12-20 NOTE — Addendum Note (Signed)
Addended by: Cheree Ditto on: 12/20/2022 01:58 PM   Modules accepted: Orders

## 2022-12-28 ENCOUNTER — Ambulatory Visit (HOSPITAL_BASED_OUTPATIENT_CLINIC_OR_DEPARTMENT_OTHER): Payer: Medicaid Other | Admitting: Cardiovascular Disease

## 2022-12-28 ENCOUNTER — Encounter (HOSPITAL_BASED_OUTPATIENT_CLINIC_OR_DEPARTMENT_OTHER): Payer: Self-pay | Admitting: Cardiovascular Disease

## 2022-12-28 VITALS — BP 144/100 | HR 98 | Ht 68.0 in | Wt 284.4 lb

## 2022-12-28 DIAGNOSIS — I471 Supraventricular tachycardia, unspecified: Secondary | ICD-10-CM | POA: Diagnosis not present

## 2022-12-28 DIAGNOSIS — F17200 Nicotine dependence, unspecified, uncomplicated: Secondary | ICD-10-CM

## 2022-12-28 DIAGNOSIS — Z006 Encounter for examination for normal comparison and control in clinical research program: Secondary | ICD-10-CM

## 2022-12-28 DIAGNOSIS — I10 Essential (primary) hypertension: Secondary | ICD-10-CM | POA: Diagnosis not present

## 2022-12-28 MED ORDER — SODIUM CHLORIDE 1 G PO TABS: 2.0000 g | ORAL_TABLET | Freq: Three times a day (TID) | ORAL | 3 refills | Status: DC

## 2022-12-28 MED ORDER — AMLODIPINE BESYLATE-VALSARTAN 10-320 MG PO TABS
1.0000 | ORAL_TABLET | Freq: Every day | ORAL | 3 refills | Status: DC
Start: 2022-12-28 — End: 2024-02-28

## 2022-12-28 NOTE — Research (Signed)
  Subject Name: Terri [name] met inclusion and exclusion criteria for the Virtual Care and Social Determinant Interventions for the management of hypertension trial.  The informed consent form, study requirements and expectations were reviewed with the subject by Dr. Duke Salvia and myself. The subject was given the opportunity to read the consent and ask questions. The subject verbalized understanding of the trial requirements.  All questions were addressed prior to the signing of the consent form. The subject agreed to participate in the trial and signed the informed consent. The informed consent was obtained prior to performance of any protocol-specific procedures for the subject.  A copy of the signed informed consent was given to the subject and a copy was placed in the subject's medical record.  Terri [name] was randomized to Group 2.
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
## 2022-12-28 NOTE — Patient Instructions (Addendum)
Medication Instructions:  TAKE SODIUM CHLORIDE 2 G THREE TIMES A DAY FOR 3 DAYS. ON THE 3RD DAY OF TAKING SODIUM CHLORIDE GO FOR YOUR LABS AND START YOUR 24 HOUR URINE  Labwork: BMET AND 24 HOUR URINE GO FOR BMET ON THE 3RD DAY OF TAKING SODIUM CHLORIDE TABLETS, START THE URINE THAT MORNING    Testing/Procedures: NONE  Follow-Up: 01/30/2023 1:30 pm with Pharm D   Any Other Special Instructions Will Be Listed Below (If Applicable). MONITOR YOUR BLOOD PRESSURE TWICE A DAY WITH MACHINE PROVIDED, LOG IN THE BOOK PROVIDED. BRING THE BOOK AND YOUR BLOOD PRESSURE MACHINE TO YOUR FOLLOW UP IN 1 MONTH   If you need a refill on your cardiac medications before your next appointment, please call your pharmacy.

## 2022-12-28 NOTE — Addendum Note (Signed)
Addended by: Chilton Si C on: 12/28/2022 04:33 PM   Modules accepted: Orders
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
## 2023-01-09 DIAGNOSIS — G4733 Obstructive sleep apnea (adult) (pediatric): Secondary | ICD-10-CM | POA: Diagnosis not present

## 2023-01-12 ENCOUNTER — Ambulatory Visit (HOSPITAL_COMMUNITY): Payer: Medicaid Other | Attending: Cardiology

## 2023-01-12 DIAGNOSIS — I3139 Other pericardial effusion (noninflammatory): Secondary | ICD-10-CM | POA: Diagnosis not present

## 2023-01-12 LAB — ECHOCARDIOGRAM LIMITED
Area-P 1/2: 3.89 cm2
S' Lateral: 2.7 cm

## 2023-01-16 ENCOUNTER — Ambulatory Visit: Payer: Medicaid Other | Attending: Internal Medicine
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
## 2023-01-25 ENCOUNTER — Institutional Professional Consult (permissible substitution) (HOSPITAL_BASED_OUTPATIENT_CLINIC_OR_DEPARTMENT_OTHER): Payer: Medicaid Other | Admitting: Family

## 2023-01-30 ENCOUNTER — Encounter (HOSPITAL_BASED_OUTPATIENT_CLINIC_OR_DEPARTMENT_OTHER): Payer: Self-pay | Admitting: Pharmacist Clinician (PhC)/ Clinical Pharmacy Specialist

## 2023-01-30 ENCOUNTER — Ambulatory Visit (INDEPENDENT_AMBULATORY_CARE_PROVIDER_SITE_OTHER): Payer: Medicaid Other | Admitting: Pharmacist Clinician (PhC)/ Clinical Pharmacy Specialist

## 2023-01-30 VITALS — BP 137/95 | HR 94 | Ht 68.0 in | Wt 275.6 lb

## 2023-01-30 DIAGNOSIS — I10 Essential (primary) hypertension: Secondary | ICD-10-CM

## 2023-01-30 NOTE — Patient Instructions (Signed)
Follow up appointment: Sept 24 at 3:30 pm  Take your BP meds as follows:  no change in medications today  Check your blood pressure at home daily (if able) and keep record of the readings.  Hypertension "High blood pressure"  Hypertension is often called "The Silent Killer." It rarely causes symptoms until it is extremely  high or has done damage to other organs in the body. For this reason, you should have your  blood pressure checked regularly by your physician. We will check your blood pressure  every time you see a provider at one of our offices.   Your blood pressure reading consists of two numbers. Ideally, blood pressure should be  below 120/80. The first ("top") number is called the systolic pressure. It measures the  pressure in your arteries as your heart beats. The second ("bottom") number is called the diastolic pressure. It measures the pressure in your arteries as the heart relaxes between beats.  The benefits of getting your blood pressure under control are enormous. A 10-point  reduction in systolic blood pressure can reduce your risk of stroke by 27% and heart failure by 28%  Your blood pressure goal is < 130/80  To check your pressure at home you will need to:  1. Sit up in a chair, with feet flat on the floor and back supported. Do not cross your ankles or legs. 2. Rest your left arm so that the cuff is about heart level. If the cuff goes on your upper arm,  then just relax the arm on the table, arm of the chair or your lap. If you have a wrist cuff, we  suggest relaxing your wrist against your chest (think of it as Pledging the Flag with the  wrong arm).  3. Place the cuff snugly around your arm, about 1 inch above the crook of your elbow. The  cords should be inside the groove of your elbow.  4. Sit quietly, with the cuff in place, for about 5 minutes. After that 5 minutes press the power  button to start a reading. 5. Do not talk or move while the reading is  taking place.  6. Record your readings on a sheet of paper. Although most cuffs have a memory, it is often  easier to see a pattern developing when the numbers are all in front of you.  7. You can repeat the reading after 1-3 minutes if it is recommended  Make sure your bladder is empty and you have not had caffeine or tobacco within the last 30 min  Always bring your blood pressure log with you to your appointments. If you have not brought your monitor in to be double checked for accuracy, please bring it to your next appointment.  You can find a list of quality blood pressure cuffs at [email or web address]

## 2023-01-30 NOTE — Assessment & Plan Note (Signed)
Patient currently on Ozempic, just increased to 0.5 mg dose this week.  Had long discussion on MOA, side effects and how to eat while on medication.  She will make some adjustments to her portion sizes and eating times in effort to decrease GI side effects and promote weight loss

## 2023-01-30 NOTE — Progress Notes (Unsigned)
Office Visit    Patient Name: Terri [name] Date of Encounter: 01/30/2023  Primary Care Provider:  Alicia Amel, MD Primary Cardiologist:  Parke Poisson, MD  Chief Complaint    Hypertension - [facility] clinic  Past Medical History   preeclampsia Son now 3  asthma   OSA   SVT Dx 2/24 with ED visit - HR 130's  DM2 7/24 A1c 6.8, now on Ozempic, 0.5 mg started this week    No Known Allergies  History of Present Illness    Terri [name] is a 37 y.o. female patient who was referred to the [facility] by Dr. Jacques Navy.  She first saw Dr. Duke Salvia last month, with a BP of 144/100 and patient noted having been first diagnosed back in 2006 during pregnancy, and has been difficult to control for some time.  Dr. Duke Salvia restarted her amlodipine/valsartan .  Renin aldosterone labs were indeterminate for hyperaldosteronism, so patient did a sodium load.  She was also agreeable to join our [employer] RPM research and randomized to group 1.   Today she returns for follow up.  She has not had medication yesterday or today, as she ran out.  Was in [place] on vacation for the past week and hasn't had a chance to get to the pharmacy.  Will plan to pick up medication today.  Had several questions in regards to Ozempic and GI side effects.  Increased to 0.5 mg dose this past Sunday  Blood Pressure Goal:  130/80  Current Medications:  amlodipine/valsartan 10/320 mg qd  Family Hx:  unknown history for father; mother has hypertension (controlled) and dementia, no full siblings, sons 48,3  Social Hx:      Tobacco:smokes marijuana  Alcohol: occasional  Caffeine: [employer] 2-3 times per month  Diet:  no beef or pork, limits protein to fish and chicken; has been eating only 1 meal per day and a few snacks otherwise  Exercise: hasn't since SVT episode in February.    Home BP readings:    125-140 systolic per recall; didn't check last week when on  vacation  Accessory Clinical Findings    Lab Results  Component Value Date   CREATININE 0.84 12/13/2022   BUN 10 12/13/2022   NA 136 12/13/2022   K 4.2 12/13/2022   CL 101 12/13/2022   CO2 22 12/13/2022   Lab Results  Component Value Date   ALT 13 07/20/2022   AST 17 07/20/2022   ALKPHOS 66 07/20/2022   BILITOT 0.5 07/20/2022   Lab Results  Component Value Date   HGBA1C 6.8 (H) 12/19/2022    Screening for Secondary Hypertension: { Click here to document screening for secondary causes of HTN  :1}     12/28/2022    4:24 PM  Causes  Drugs/Herbals Screened     - Comments limits sodium, caffeine and EtOH.  Limits sodium intake.  Renovascular HTN Screened     - Comments renal Dopplers negative 11/2022  Sleep Apnea Screened     - Comments awaiting CPAP  Thyroid Disease Screened  Hyperaldosteronism Screened     - Comments confirmatory testing  Pheochromocytoma Screened     - Comments check catecholamines and metanephrines given SVT  Cushing's Syndrome N/A  Hyperparathyroidism Screened  Coarctation of the Aorta Screened     - Comments BP symmetric  Compliance Screened    Relevant Labs/Studies:    Latest Ref Rng & Units 12/13/2022   11:31 AM 08/14/2022  6:17 PM 07/20/2022    4:43 AM  Basic Labs  Sodium 134 - 144 mmol/L 136  137  137   Potassium 3.5 - 5.2 mmol/L 4.2  3.5  3.3   Creatinine 0.57 - 1.00 mg/dL 1.61  0.96  0.45        Latest Ref Rng & Units 07/19/2022    7:25 PM 02/10/2016    4:47 PM  Thyroid   TSH 0.350 - 4.500 uIU/mL 0.947  1.25        Latest Ref Rng & Units 12/13/2022   11:31 AM  Renin/Aldosterone   Aldosterone 0.0 - 30.0 ng/dL 40.9   Aldos/Renin Ratio 0.0 - 30.0 16.3              12/20/2022   11:08 AM  Renovascular   Renal Artery [place] Completed Yes      Home Medications    Current Outpatient Medications  Medication Sig Dispense Refill   amLODipine-valsartan (EXFORGE) 10-320 MG tablet Take 1 tablet by mouth daily. 90 tablet 3   Blood  Pressure KIT Use to check blood pressure as instructed by your physician (Patient not taking: Reported on 11/21/2022) 1 kit 0   Semaglutide,0.25 or 0.5MG /DOS, (OZEMPIC, 0.25 OR 0.5 MG/DOSE,) 2 MG/3ML SOPN Inject 0.25mg  once weekly for 4 weeks and then inject 0.5mg  weekly starting on week 5 (Patient not taking: Reported on 12/28/2022) 3 mL 0   sodium chloride 1 g tablet Take 2 tablets (2 g total) by mouth 3 (three) times daily. 18 tablet 3   No current facility-administered medications for this visit.     Assessment & Plan      Essential hypertension Assessment: BP is uncontrolled in office BP 137/95 mmHg;  above the goal (<130/80). Missed 2 doses of amlodipine/valsartan Tolerates medication well without any side effects Denies SOB, palpitation, chest pain, headaches,or swelling Reiterated the importance of regular exercise and low salt diet   Plan:  Continue taking amlodipine/valsartan 10/320 daily Patient to keep record of BP readings with heart rate and report to [place] at the next visit Patient to follow up with me in 1 month  Labs ordered today:  none - she will get metanephrines and 24 hour urine sodium/creatinine/aldosterone drawn asap   Obesity, morbid, BMI 40.0-49.9 (HCC) Patient currently on Ozempic, just increased to 0.5 mg dose this week.  Had long discussion on MOA, side effects and how to eat while on medication.  She will make some adjustments to her portion sizes and eating times in effort to decrease GI side effects and promote weight loss   Phillips Hay PharmD CPP [facility]  [address] [place], [place] [address] [phone]

## 2023-01-30 NOTE — Assessment & Plan Note (Signed)
Assessment: BP is uncontrolled in office BP 137/95 mmHg;  above the goal (<130/80). Missed 2 doses of amlodipine/valsartan Tolerates medication well without any side effects Denies SOB, palpitation, chest pain, headaches,or swelling Reiterated the importance of regular exercise and low salt diet   Plan:  Continue taking amlodipine/valsartan 10/320 daily Patient to keep record of BP readings with heart rate and report to [place] at the next visit Patient to follow up with me in 1 month  Labs ordered today:  none - she will get metanephrines and 24 hour urine sodium/creatinine/aldosterone drawn asap

## 2023-02-02 ENCOUNTER — Other Ambulatory Visit: Payer: Self-pay | Admitting: Internal Medicine

## 2023-02-02 DIAGNOSIS — E119 Type 2 diabetes mellitus without complications: Secondary | ICD-10-CM

## 2023-02-07 ENCOUNTER — Telehealth: Payer: Self-pay | Admitting: Internal Medicine

## 2023-02-07 DIAGNOSIS — E119 Type 2 diabetes mellitus without complications: Secondary | ICD-10-CM

## 2023-02-07 MED ORDER — OZEMPIC (0.25 OR 0.5 MG/DOSE) 2 MG/3ML ~~LOC~~ SOPN
0.5000 mg | PEN_INJECTOR | SUBCUTANEOUS | 0 refills | Status: DC
Start: 2023-02-07 — End: 2023-03-06

## 2023-02-07 NOTE — Telephone Encounter (Signed)
*  STAT* If patient is at the pharmacy, call can be transferred to refill team.   1. Which medications need to be refilled? (please list name of each medication and dose if known)   Semaglutide,0.25 or 0.5MG /DOS, (OZEMPIC, 0.25 OR 0.5 MG/DOSE,) 2 MG/3ML SOPN    2. Which pharmacy/location (including street and city if local pharmacy) is medication to be sent to? [id number] - [place], [place] - [address] AT [facility] [address]    3. Do they need a 30 day or 90 day supply? 90 day

## 2023-02-09 DIAGNOSIS — G4733 Obstructive sleep apnea (adult) (pediatric): Secondary | ICD-10-CM | POA: Diagnosis not present

## 2023-03-06 ENCOUNTER — Ambulatory Visit (HOSPITAL_BASED_OUTPATIENT_CLINIC_OR_DEPARTMENT_OTHER): Payer: Medicaid Other | Admitting: Pharmacist Clinician (PhC)/ Clinical Pharmacy Specialist

## 2023-03-06 ENCOUNTER — Encounter (HOSPITAL_BASED_OUTPATIENT_CLINIC_OR_DEPARTMENT_OTHER): Payer: Self-pay | Admitting: Pharmacist Clinician (PhC)/ Clinical Pharmacy Specialist

## 2023-03-06 VITALS — BP 104/73 | Wt 275.0 lb

## 2023-03-06 DIAGNOSIS — I10 Essential (primary) hypertension: Secondary | ICD-10-CM | POA: Diagnosis not present

## 2023-03-06 MED ORDER — OZEMPIC (2 MG/DOSE) 8 MG/3ML ~~LOC~~ SOPN: 2.0000 mg | PEN_INJECTOR | SUBCUTANEOUS | 2 refills | Status: DC

## 2023-03-06 MED ORDER — SEMAGLUTIDE (1 MG/DOSE) 4 MG/3ML ~~LOC~~ SOPN: 1.0000 mg | PEN_INJECTOR | SUBCUTANEOUS | 1 refills | Status: DC

## 2023-03-06 NOTE — Assessment & Plan Note (Signed)
Assessment: BP is controlled in office BP 104/73 mmHg. Tolerates amlodipine/valsartan well without any side effects Denies SOB, palpitation, chest pain, headaches,or swelling Reiterated the importance of regular exercise and low salt diet   Plan:  Continue taking amlodipine/valsartan 10/320 mg daily Patient to keep record of BP readings with heart rate and report to [place] at the next visit Patient to follow up with PharmD in 1 month  Labs ordered today: none

## 2023-03-06 NOTE — Progress Notes (Unsigned)
Office Visit    Patient Name: Terri [name] Date of Encounter: 03/06/2023  Primary Care Provider:  Alicia Amel, MD Primary Cardiologist:  Parke Poisson, MD  Chief Complaint    Hypertension - Advanced hypertension clinic  Past Medical History   preeclampsia Son now 3  asthma   OSA   SVT Dx 2/24 with ED visit - HR 130's  DM2 7/24 A1c 6.8, now on Ozempic, 0.5 mg started this week    No Known Allergies  History of Present Illness    Terri [name] is a 37 y.o. female patient who was referred to the [facility] by Dr. Jacques Navy.  She first saw Dr. Duke Salvia last month, with a BP of 144/100 and patient noted having been first diagnosed back in 2006 during pregnancy, and has been difficult to control for some time.  Dr. Duke Salvia restarted her amlodipine/valsartan .  Renin aldosterone labs were indeterminate for hyperaldosteronism, so patient did a sodium load.  She was also agreeable to join our [employer] RPM research and randomized to group 1.   At her first follow up we made no changes, as she had been out of medication for a few days.    Today she returns for follow up.  Has been taking Ozempic 0.5 mg and done well, although not much weight loss at this time.  Has not missed any doses of amlodipine/valsartan, but will occasionally take in the evenings if forgot in the morning.    Blood Pressure Goal:  130/80  Current Medications:  amlodipine/valsartan 10/320 mg qd  Family Hx:  unknown history for father; mother has hypertension (controlled) and dementia, no full siblings, sons 83,3  Social Hx:      Tobacco:smokes marijuana  Alcohol: occasional  Caffeine: [employer] 2-3 times per month  Diet:  no beef or pork, limits protein to fish and chicken; has been eating only 1 meal per day and a few snacks otherwise  Exercise: hasn't since SVT episode in February.    Home BP readings:    125-140 systolic per recall; didn't check last week when on  vacation  Accessory Clinical Findings    Lab Results  Component Value Date   CREATININE 0.84 12/13/2022   BUN 10 12/13/2022   NA 136 12/13/2022   K 4.2 12/13/2022   CL 101 12/13/2022   CO2 22 12/13/2022   Lab Results  Component Value Date   ALT 13 07/20/2022   AST 17 07/20/2022   ALKPHOS 66 07/20/2022   BILITOT 0.5 07/20/2022   Lab Results  Component Value Date   HGBA1C 6.8 (H) 12/19/2022    Screening for Secondary Hypertension:      12/28/2022    4:24 PM  Causes  Drugs/Herbals Screened     - Comments limits sodium, caffeine and EtOH.  Limits sodium intake.  Renovascular HTN Screened     - Comments renal Dopplers negative 11/2022  Sleep Apnea Screened     - Comments awaiting CPAP  Thyroid Disease Screened  Hyperaldosteronism Screened     - Comments confirmatory testing  Pheochromocytoma Screened     - Comments check catecholamines and metanephrines given SVT  Cushing's Syndrome N/A  Hyperparathyroidism Screened  Coarctation of the Aorta Screened     - Comments BP symmetric  Compliance Screened    Relevant Labs/Studies:    Latest Ref Rng & Units 12/13/2022   11:31 AM 08/14/2022    6:17 PM 07/20/2022    4:43 AM  Basic  Labs  Sodium 134 - 144 mmol/L 136  137  137   Potassium 3.5 - 5.2 mmol/L 4.2  3.5  3.3   Creatinine 0.57 - 1.00 mg/dL 6.57  8.46  9.62        Latest Ref Rng & Units 07/19/2022    7:25 PM 02/10/2016    4:47 PM  Thyroid   TSH 0.350 - 4.500 uIU/mL 0.947  1.25        Latest Ref Rng & Units 12/13/2022   11:31 AM  Renin/Aldosterone   Aldosterone 0.0 - 30.0 ng/dL 95.2   Aldos/Renin Ratio 0.0 - 30.0 16.3              12/20/2022   11:08 AM  Renovascular   Renal Artery [place] Completed Yes      Home Medications    Current Outpatient Medications  Medication Sig Dispense Refill   amLODipine-valsartan (EXFORGE) 10-320 MG tablet Take 1 tablet by mouth daily. 90 tablet 3   Blood Pressure KIT Use to check blood pressure as instructed by your physician  (Patient not taking: Reported on 11/21/2022) 1 kit 0   sodium chloride 1 g tablet Take 2 tablets (2 g total) by mouth 3 (three) times daily. 18 tablet 3   No current facility-administered medications for this visit.     Assessment & Plan      Essential hypertension Assessment: BP is controlled in office BP 104/73 mmHg. Tolerates amlodipine/valsartan well without any side effects Denies SOB, palpitation, chest pain, headaches,or swelling Reiterated the importance of regular exercise and low salt diet   Plan:  Continue taking amlodipine/valsartan 10/320 mg daily Patient to keep record of BP readings with heart rate and report to [place] at the next visit Patient to follow up with PharmD in 1 month  Labs ordered today: none    Phillips Hay PharmD CPP [facility]  [address] [place], [place] [address] [phone]

## 2023-03-06 NOTE — Patient Instructions (Signed)
Follow up appointment: October 22 at 4 pm  Take your BP meds as follows: continue with valsartan/amlodipine  Check your blood pressure at home daily (if able) and keep record of the readings.  Hypertension "High blood pressure"  Hypertension is often called "The Silent Killer." It rarely causes symptoms until it is extremely  high or has done damage to other organs in the body. For this reason, you should have your  blood pressure checked regularly by your physician. We will check your blood pressure  every time you see a provider at one of our offices.   Your blood pressure reading consists of two numbers. Ideally, blood pressure should be  below 120/80. The first ("top") number is called the systolic pressure. It measures the  pressure in your arteries as your heart beats. The second ("bottom") number is called the diastolic pressure. It measures the pressure in your arteries as the heart relaxes between beats.  The benefits of getting your blood pressure under control are enormous. A 10-point  reduction in systolic blood pressure can reduce your risk of stroke by 27% and heart failure by 28%  Your blood pressure goal is < 130/80  To check your pressure at home you will need to:  1. Sit up in a chair, with feet flat on the floor and back supported. Do not cross your ankles or legs. 2. Rest your left arm so that the cuff is about heart level. If the cuff goes on your upper arm,  then just relax the arm on the table, arm of the chair or your lap. If you have a wrist cuff, we  suggest relaxing your wrist against your chest (think of it as Pledging the Flag with the  wrong arm).  3. Place the cuff snugly around your arm, about 1 inch above the crook of your elbow. The  cords should be inside the groove of your elbow.  4. Sit quietly, with the cuff in place, for about 5 minutes. After that 5 minutes press the power  button to start a reading. 5. Do not talk or move while the reading  is taking place.  6. Record your readings on a sheet of paper. Although most cuffs have a memory, it is often  easier to see a pattern developing when the numbers are all in front of you.  7. You can repeat the reading after 1-3 minutes if it is recommended  Make sure your bladder is empty and you have not had caffeine or tobacco within the last 30 min  Always bring your blood pressure log with you to your appointments. If you have not brought your monitor in to be double checked for accuracy, please bring it to your next appointment.  You can find a list of quality blood pressure cuffs at [email or web address]

## 2023-03-12 DIAGNOSIS — G4733 Obstructive sleep apnea (adult) (pediatric): Secondary | ICD-10-CM | POA: Diagnosis not present

## 2023-04-03 ENCOUNTER — Ambulatory Visit (INDEPENDENT_AMBULATORY_CARE_PROVIDER_SITE_OTHER): Payer: Medicaid Other | Admitting: Pharmacist Clinician (PhC)/ Clinical Pharmacy Specialist

## 2023-04-03 VITALS — BP 117/84 | HR 96

## 2023-04-03 DIAGNOSIS — I10 Essential (primary) hypertension: Secondary | ICD-10-CM | POA: Diagnosis not present

## 2023-04-03 NOTE — Patient Instructions (Signed)
Follow up appointment: December 11 at 2 pm with Dr. Duke Salvia  Take your BP meds as follows: no change in medications  Check your blood pressure at home daily (if able) and keep record of the readings.  Hypertension "High blood pressure"  Hypertension is often called "The Silent Killer." It rarely causes symptoms until it is extremely  high or has done damage to other organs in the body. For this reason, you should have your  blood pressure checked regularly by your physician. We will check your blood pressure  every time you see a provider at one of our offices.   Your blood pressure reading consists of two numbers. Ideally, blood pressure should be  below 120/80. The first ("top") number is called the systolic pressure. It measures the  pressure in your arteries as your heart beats. The second ("bottom") number is called the diastolic pressure. It measures the pressure in your arteries as the heart relaxes between beats.  The benefits of getting your blood pressure under control are enormous. A 10-point  reduction in systolic blood pressure can reduce your risk of stroke by 27% and heart failure by 28%  Your blood pressure goal is < 130/80  To check your pressure at home you will need to:  1. Sit up in a chair, with feet flat on the floor and back supported. Do not cross your ankles or legs. 2. Rest your left arm so that the cuff is about heart level. If the cuff goes on your upper arm,  then just relax the arm on the table, arm of the chair or your lap. If you have a wrist cuff, we  suggest relaxing your wrist against your chest (think of it as Pledging the Flag with the  wrong arm).  3. Place the cuff snugly around your arm, about 1 inch above the crook of your elbow. The  cords should be inside the groove of your elbow.  4. Sit quietly, with the cuff in place, for about 5 minutes. After that 5 minutes press the power  button to start a reading. 5. Do not talk or move while  the reading is taking place.  6. Record your readings on a sheet of paper. Although most cuffs have a memory, it is often  easier to see a pattern developing when the numbers are all in front of you.  7. You can repeat the reading after 1-3 minutes if it is recommended  Make sure your bladder is empty and you have not had caffeine or tobacco within the last 30 min  Always bring your blood pressure log with you to your appointments. If you have not brought your monitor in to be double checked for accuracy, please bring it to your next appointment.  You can find a list of quality blood pressure cuffs at [email or web address]

## 2023-04-03 NOTE — Progress Notes (Unsigned)
Office Visit    Patient Name: Terri [name] Date of Encounter: 04/04/2023  Primary Care Provider:  Alicia Amel, MD Primary Cardiologist:  Parke Poisson, MD  Chief Complaint    Hypertension - Advanced hypertension clinic  Past Medical History   preeclampsia Son now 3  asthma   OSA   SVT Dx 2/24 with ED visit - HR 130's  DM2 7/24 A1c 6.8, now on Ozempic, 0.5 mg started this week    No Known Allergies  History of Present Illness    Terri [name] is a 37 y.o. female patient who was referred to the Advanced Hypertension Clinic by Dr. Jacques Navy.  She first saw Dr. Duke Salvia last month, with a BP of 144/100 and patient noted having been first diagnosed back in 2006 during pregnancy, and has been difficult to control for some time.  Dr. Duke Salvia restarted her amlodipine/valsartan .  Renin aldosterone labs were indeterminate for hyperaldosteronism, so patient did a sodium load.  She was also agreeable to join our [facility] RPM research and randomized to group 1.   At her first follow up we made no changes, as she had been out of medication for a few days.    Today she returns for follow up.  Has been taking Ozempic 0.5 mg and done well, although not much weight loss at this time.  Has not missed any doses of amlodipine/valsartan, but will occasionally take in the evenings if forgot in the morning.    Blood Pressure Goal:  130/80  Current Medications:  amlodipine/valsartan 10/320 mg qd  Family Hx:  unknown history for father; mother has hypertension (controlled) and dementia, no full siblings, sons 69,3  Social Hx:      Tobacco:smokes marijuana  Alcohol: occasional  Caffeine: [employer] 2-3 times per month  Diet:  no beef or pork, limits protein to fish and chicken; has been eating only 1 meal per day and a few snacks otherwise  Exercise: hasn't since SVT episode in February.    Home BP readings:  has not been checking regularly at home  Accessory Clinical Findings    Lab  Results  Component Value Date   CREATININE 0.84 12/13/2022   BUN 10 12/13/2022   NA 136 12/13/2022   K 4.2 12/13/2022   CL 101 12/13/2022   CO2 22 12/13/2022   Lab Results  Component Value Date   ALT 13 07/20/2022   AST 17 07/20/2022   ALKPHOS 66 07/20/2022   BILITOT 0.5 07/20/2022   Lab Results  Component Value Date   HGBA1C 6.8 (H) 12/19/2022    Screening for Secondary Hypertension:      12/28/2022    4:24 PM  Causes  Drugs/Herbals Screened     - Comments limits sodium, caffeine and EtOH.  Limits sodium intake.  Renovascular HTN Screened     - Comments renal Dopplers negative 11/2022  Sleep Apnea Screened     - Comments awaiting CPAP  Thyroid Disease Screened  Hyperaldosteronism Screened     - Comments confirmatory testing  Pheochromocytoma Screened     - Comments check catecholamines and metanephrines given SVT  Cushing's Syndrome N/A  Hyperparathyroidism Screened  Coarctation of the Aorta Screened     - Comments BP symmetric  Compliance Screened    Relevant Labs/Studies:    Latest Ref Rng & Units 12/13/2022   11:31 AM 08/14/2022    6:17 PM 07/20/2022    4:43 AM  Basic Labs  Sodium 134 - 144  mmol/L 136  137  137   Potassium 3.5 - 5.2 mmol/L 4.2  3.5  3.3   Creatinine 0.57 - 1.00 mg/dL 2.95  6.21  3.08        Latest Ref Rng & Units 07/19/2022    7:25 PM 02/10/2016    4:47 PM  Thyroid   TSH 0.350 - 4.500 uIU/mL 0.947  1.25        Latest Ref Rng & Units 12/13/2022   11:31 AM  Renin/Aldosterone   Aldosterone 0.0 - 30.0 ng/dL 65.7   Aldos/Renin Ratio 0.0 - 30.0 16.3              12/20/2022   11:08 AM  Renovascular   Renal Artery [place] Completed Yes      Home Medications    Current Outpatient Medications  Medication Sig Dispense Refill   amLODipine-valsartan (EXFORGE) 10-320 MG tablet Take 1 tablet by mouth daily. 90 tablet 3   Blood Pressure KIT Use to check blood pressure as instructed by your physician (Patient not taking: Reported on 11/21/2022) 1 kit  0   Semaglutide, 1 MG/DOSE, 4 MG/3ML SOPN Inject 1 mg into the skin once a week. 3 mL 1   Semaglutide, 2 MG/DOSE, (OZEMPIC, 2 MG/DOSE,) 8 MG/3ML SOPN Inject 2 mg into the skin every 7 (seven) days. 3 mL 2   sodium chloride 1 g tablet Take 2 tablets (2 g total) by mouth 3 (three) times daily. 18 tablet 3   No current facility-administered medications for this visit.     Assessment & Plan      Essential hypertension Assessment: BP is controlled in office BP 117/84 mmHg;   Tolerates amlodipine/valsartan well without any side effects Denies SOB, palpitation, chest pain, headaches,or swelling Reiterated the importance of regular exercise and low salt diet   Plan:  Continue taking amlodipine/valsartan 10/320 mg once daily Patient to keep record of BP readings with heart rate and report to [place] at the next visit Patient to follow up with Dr. Sharyne Richters in January (final [facility] appointment)  Labs ordered today:  none   Phillips Hay PharmD CPP [facility]  [address] [place], [place] [address] [phone]

## 2023-04-04 ENCOUNTER — Encounter (HOSPITAL_BASED_OUTPATIENT_CLINIC_OR_DEPARTMENT_OTHER): Payer: Self-pay | Admitting: Pharmacist Clinician (PhC)/ Clinical Pharmacy Specialist

## 2023-04-04 NOTE — Assessment & Plan Note (Signed)
Assessment: BP is controlled in office BP 117/84 mmHg;   Tolerates amlodipine/valsartan well without any side effects Denies SOB, palpitation, chest pain, headaches,or swelling Reiterated the importance of regular exercise and low salt diet   Plan:  Continue taking amlodipine/valsartan 10/320 mg once daily Patient to keep record of BP readings with heart rate and report to [place] at the next visit Patient to follow up with Dr. Sharyne Richters in January (final [facility] appointment)  Labs ordered today:  none

## 2023-04-11 DIAGNOSIS — G4733 Obstructive sleep apnea (adult) (pediatric): Secondary | ICD-10-CM | POA: Diagnosis not present

## 2023-05-03 DIAGNOSIS — R112 Nausea with vomiting, unspecified: Secondary | ICD-10-CM | POA: Diagnosis not present

## 2023-05-03 DIAGNOSIS — T887XXA Unspecified adverse effect of drug or medicament, initial encounter: Secondary | ICD-10-CM | POA: Diagnosis not present

## 2023-05-03 DIAGNOSIS — R519 Headache, unspecified: Secondary | ICD-10-CM | POA: Diagnosis not present

## 2023-05-12 DIAGNOSIS — G4733 Obstructive sleep apnea (adult) (pediatric): Secondary | ICD-10-CM | POA: Diagnosis not present

## 2023-05-23 ENCOUNTER — Encounter (HOSPITAL_BASED_OUTPATIENT_CLINIC_OR_DEPARTMENT_OTHER): Payer: Self-pay | Admitting: *Deleted

## 2023-05-23 ENCOUNTER — Encounter (HOSPITAL_BASED_OUTPATIENT_CLINIC_OR_DEPARTMENT_OTHER): Payer: Self-pay | Admitting: Cardiovascular Disease

## 2023-05-23 ENCOUNTER — Ambulatory Visit (HOSPITAL_BASED_OUTPATIENT_CLINIC_OR_DEPARTMENT_OTHER): Payer: Medicaid Other | Admitting: Cardiovascular Disease

## 2023-05-23 VITALS — BP 121/84 | HR 98 | Ht 68.0 in | Wt 246.0 lb

## 2023-05-23 DIAGNOSIS — I10 Essential (primary) hypertension: Secondary | ICD-10-CM

## 2023-05-23 DIAGNOSIS — G4733 Obstructive sleep apnea (adult) (pediatric): Secondary | ICD-10-CM | POA: Diagnosis not present

## 2023-05-23 DIAGNOSIS — I471 Supraventricular tachycardia, unspecified: Secondary | ICD-10-CM
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
## 2023-05-23 NOTE — Patient Instructions (Signed)
Medication Instructions:  Your physician recommends that you continue on your current medications as directed. Please refer to the Current Medication list given to you today.  Labwork: CMET/MAGNESIUM TODAY   Testing/Procedures: NONE  Follow-Up: 6 MONTHS WITH DR Hib[place] OR CAITLIN W NP IN ADV HTN   If you need a refill on your cardiac medications before your next appointment, please call your pharmacy.

## 2023-05-24 LAB — COMPREHENSIVE METABOLIC PANEL
ALT: 8 [IU]/L (ref 0–32)
AST: 13 [IU]/L (ref 0–40)
Albumin: 4.3 g/dL (ref 3.9–4.9)
Alkaline Phosphatase: 99 [IU]/L (ref 44–121)
BUN/Creatinine Ratio: 9 (ref 9–23)
BUN: 10 mg/dL (ref 6–20)
Bilirubin Total: 0.3 mg/dL (ref 0.0–1.2)
CO2: 23 mmol/L (ref 20–29)
Calcium: 9.8 mg/dL (ref 8.7–10.2)
Chloride: 103 mmol/L (ref 96–106)
Creatinine, Ser: 1.16 mg/dL — ABNORMAL HIGH (ref 0.57–1.00)
Globulin, Total: 3.1 g/dL (ref 1.5–4.5)
Glucose: 91 mg/dL (ref 70–99)
Potassium: 4.1 mmol/L (ref 3.5–5.2)
Sodium: 139 mmol/L (ref 134–144)
Total Protein: 7.4 g/dL (ref 6.0–8.5)
eGFR: 62 mL/min/{1.73_m2} (ref >59)

## 2023-05-24 LAB — MAGNESIUM: Magnesium: 2 mg/dL (ref 1.6–2.3)

## 2023-05-28 DIAGNOSIS — N3000 Acute cystitis without hematuria: Secondary | ICD-10-CM | POA: Diagnosis not present

## 2023-06-11 DIAGNOSIS — G4733 Obstructive sleep apnea (adult) (pediatric): Secondary | ICD-10-CM | POA: Diagnosis not present

## 2023-06-12 ENCOUNTER — Encounter (HOSPITAL_BASED_OUTPATIENT_CLINIC_OR_DEPARTMENT_OTHER): Payer: Self-pay | Admitting: Cardiovascular Disease

## 2023-06-12 DIAGNOSIS — G4733 Obstructive sleep apnea (adult) (pediatric): Secondary | ICD-10-CM | POA: Insufficient documentation

## 2023-06-12 HISTORY — DX: Obstructive sleep apnea (adult) (pediatric): G47.33

## 2023-06-25 ENCOUNTER — Telehealth: Payer: Self-pay | Admitting: *Deleted

## 2023-06-25 NOTE — Telephone Encounter (Signed)
 Pt called in stating that she got antibiotics for a bladder infection and now she needs a diflucan. Pt wants to know if pcp will send this in. The pill not cream. Please advise. Montie Swiderski Bruna Potter, CMA

## 2023-06-26 MED ORDER — FLUCONAZOLE 150 MG PO TABS: 150.0000 mg | ORAL_TABLET | Freq: Once | ORAL | 0 refills | Status: AC

## 2023-07-16 NOTE — Telephone Encounter (Signed)
**Note De-Identified Devon Pretty Obfuscation** Letter received from [facility] stating that they have denied coverage of the pts CPAP Titration because she does not meet the criteria to have a in lab CPAP Titration but that she may qualify for a home sleep titration (APAP).  Will forward note to Dr Mayford Knife and her sleep coordinator.

## 2023-07-20 ENCOUNTER — Telehealth: Payer: Self-pay | Admitting: *Deleted

## 2023-07-20 DIAGNOSIS — R0683 Snoring: Secondary | ICD-10-CM

## 2023-07-20 DIAGNOSIS — G4733 Obstructive sleep apnea (adult) (pediatric): Secondary | ICD-10-CM

## 2023-07-20 DIAGNOSIS — I10 Essential (primary) hypertension: Secondary | ICD-10-CM

## 2023-07-20 DIAGNOSIS — R002 Palpitations: Secondary | ICD-10-CM

## 2023-07-20 DIAGNOSIS — E119 Type 2 diabetes mellitus without complications: Secondary | ICD-10-CM

## 2023-07-20 NOTE — Telephone Encounter (Signed)
 Prior Authorization for TITRATION sent to [facility] via web portal. Tracking Number . NOTICE OF DENIAL: NOT APPROVED:HAVE NOT TRIED APAP AND FAILED 1/28 Pending Availitiy portal Ref #: [id number]  If unable to perform an in lab titration then initiate ResMed auto CPAP from 4 to 15cm H2O with heated humidity and mask of choice and overnight pulse ox on CPAP.

## 2023-07-24 DIAGNOSIS — I160 Hypertensive urgency: Secondary | ICD-10-CM | POA: Diagnosis not present

## 2023-07-24 DIAGNOSIS — R519 Headache, unspecified: Secondary | ICD-10-CM | POA: Diagnosis not present

## 2023-07-24 NOTE — Telephone Encounter (Signed)
The patient has been notified of the result. LMTCB  Upon patient request DME selection is [facility] Patient understands he will be contacted by [facility] to set up his cpap. Patient understands to call if [facility] does not contact him with new setup in a timely manner. Patient understands they will be called once confirmation has been received from [facility] that they have received their new machine to schedule 10 week follow up appointment.   [facility] notified of new cpap order  Please add to airview Patient was grateful for the call and thanked me.

## 2023-07-26 NOTE — Addendum Note (Signed)
Addended by: Reesa Chew on: 07/26/2023 06:22 PM   Modules accepted: Orders

## 2023-07-26 NOTE — Telephone Encounter (Signed)
Upon patient request DME selection is [facility] Patient understands he will be contacted by [facility] to set up his cpap. Patient understands to call if [facility] does not contact him with new setup in a timely manner. Patient understands they will be called once confirmation has been received from [facility] that they have received their new machine to schedule 10 week follow up appointment.   [facility] notified of new cpap order  Please add to airview Patient was grateful for the call and thanked me.

## 2023-08-02 ENCOUNTER — Telehealth: Payer: Self-pay | Admitting: Cardiovascular Disease

## 2023-08-02 ENCOUNTER — Other Ambulatory Visit (HOSPITAL_BASED_OUTPATIENT_CLINIC_OR_DEPARTMENT_OTHER): Payer: Self-pay | Admitting: Cardiovascular Disease

## 2023-08-02 NOTE — Telephone Encounter (Signed)
 Refill request sent to PharmD pool.

## 2023-08-02 NOTE — Telephone Encounter (Signed)
*  STAT* If patient is at the pharmacy, call can be transferred to refill team.   1. Which medications need to be refilled? (please list name of each medication and dose if known) Semaglutide, 2 MG/DOSE, (OZEMPIC, 2 MG/DOSE,) 8 MG/3ML SOPN   2. Which pharmacy/location (including street and city if local pharmacy) is medication to be sent to?  [id number] - [place], [place] - [address] AT [facility] [address]    3. Do they need a 30 day or 90 day supply? 90

## 2023-08-28 ENCOUNTER — Telehealth: Payer: Self-pay | Admitting: *Deleted

## 2023-08-28 MED ORDER — LEVONORGESTREL 1.5 MG PO TABS: 1.5000 mg | ORAL_TABLET | Freq: Once | ORAL | 0 refills | Status: AC

## 2023-08-28 NOTE — Telephone Encounter (Signed)
 Pt calling in wanting a script for the morning after pill, says pcp called it in for her before. Please advise. Essynce Munsch Bruna Potter, CMA

## 2023-08-30 ENCOUNTER — Telehealth: Payer: Self-pay | Admitting: Cardiovascular Disease

## 2023-08-30 ENCOUNTER — Other Ambulatory Visit (HOSPITAL_BASED_OUTPATIENT_CLINIC_OR_DEPARTMENT_OTHER): Payer: Self-pay | Admitting: Internal Medicine

## 2023-08-30 DIAGNOSIS — E119 Type 2 diabetes mellitus without complications: Secondary | ICD-10-CM

## 2023-08-30 MED ORDER — OZEMPIC (2 MG/DOSE) 8 MG/3ML ~~LOC~~ SOPN: 2.0000 mg | PEN_INJECTOR | SUBCUTANEOUS | 5 refills | Status: DC

## 2023-08-30 NOTE — Telephone Encounter (Signed)
*  STAT* If patient is at the pharmacy, call can be transferred to refill team.   1. Which medications need to be refilled? OZEMPIC, 2 MG/DOSE, 8 MG/3ML SOPN   INJECT 2MG  INTO THE SKIN EVERY 7 DAYS.  2. Would you like to learn more about the convenience, safety, & potential cost savings by using the [facility]? No   3. Are you open to using the [facility] No   4. Which pharmacy/location (including street and city if local pharmacy) is medication to be sent to? [id number] - [place], [place] - [address] AT [facility] OF [address]     5. Do they need a 30 day or 90 day supply? 90 Day supply. Pt requested for 90 days because she does not want to call every month to get a refill.  Pt next dosage is due tomorrow.

## 2023-09-13 NOTE — Telephone Encounter (Addendum)
 Fax came from [facility] that they are unable to make contact with the pt. Patient follow up will be made by our office.    Reached out to patient to make sure she had the dme's contact information and she states yes she has it and she will call them to get scheduled.

## 2023-09-28 DIAGNOSIS — Z30012 Encounter for prescription of emergency contraception: Secondary | ICD-10-CM | POA: Diagnosis not present

## 2023-11-20 ENCOUNTER — Encounter: Payer: Self-pay | Admitting: *Deleted

## 2023-12-10 ENCOUNTER — Telehealth: Payer: Self-pay | Admitting: Pharmacy Technician

## 2023-12-10 NOTE — Telephone Encounter (Signed)
 Received notification from renewals to get another pa for ozempic . I tried to get pa since another encounter says the current pa expires 12/20/23. But it says pa not required.

## 2023-12-23 ENCOUNTER — Ambulatory Visit
Admission: EM | Admit: 2023-12-23 | Discharge: 2023-12-23 | Disposition: A | Discharge status: Home / Self Care | Attending: Emergency Medicine | Admitting: Emergency Medicine

## 2023-12-23 DIAGNOSIS — L0291 Cutaneous abscess, unspecified: Secondary | ICD-10-CM | POA: Diagnosis not present

## 2023-12-23 MED ORDER — FLUCONAZOLE 150 MG PO TABS: 150.0000 mg | ORAL_TABLET | Freq: Once | ORAL | 0 refills | Status: DC | PRN

## 2023-12-23 MED ORDER — DOXYCYCLINE HYCLATE 100 MG PO CAPS: 100.0000 mg | ORAL_CAPSULE | Freq: Two times a day (BID) | ORAL | 0 refills | Status: AC

## 2023-12-23 MED ORDER — IBUPROFEN 800 MG PO TABS: 800.0000 mg | ORAL_TABLET | Freq: Three times a day (TID) | ORAL | 0 refills | Status: DC
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
## 2023-12-23 NOTE — Discharge Instructions (Addendum)
 Doxycycline  twice daily for 7 days in a row. Take with food to avoid upset stomach. I have also sent the fluconazole  to prevent yeast infection.   Ibuprofen  every 6 hours for pain and swelling Continue warm compress to soften the area Gentle washing with soap and water  Please return if needed
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
## 2023-12-25 ENCOUNTER — Ambulatory Visit
Admission: EM | Admit: 2023-12-25 | Discharge: 2023-12-25 | Disposition: A | Discharge status: Home / Self Care | Attending: Urgent Care | Admitting: Urgent Care

## 2023-12-25 ENCOUNTER — Telehealth: Payer: Self-pay

## 2023-12-25 DIAGNOSIS — N764 Abscess of vulva: Secondary | ICD-10-CM

## 2023-12-25 MED ORDER — HYDROCODONE-ACETAMINOPHEN 5-325 MG PO TABS: 1.0000 | ORAL_TABLET | Freq: Four times a day (QID) | ORAL | 0 refills | Status: DC | PRN

## 2023-12-25 MED ORDER — NAPROXEN 500 MG PO TABS: 500.0000 mg | ORAL_TABLET | Freq: Two times a day (BID) | ORAL | 0 refills | Status: DC

## 2023-12-25 NOTE — ED Triage Notes (Signed)
 Pt c/o vaginal abscess x 3 days-states she was seen 2 days ago-taking abx and using warm compresses-feels area looks the same but is more painful-NAD-steady gait

## 2023-12-25 NOTE — Discharge Instructions (Addendum)
 Continue taking doxycycline  for the infection. Please schedule naproxen  twice daily with food for your severe pain.  If you still have pain despite taking naproxen  regularly, this is breakthrough pain.  You can use hydrocodone , a narcotic pain medicine, once every 4-6 hours for this.  Once your pain is better controlled, switch back to just naproxen .   Please change your dressing 2-5 times daily depending on how much drainage is seen.  Each time you change your dressing, make sure that you are pressing on the wound to get pus to come out.  Try your best to clean the wound with antibacterial soap and warm water. Pat your wound dry and let it air out if possible to make sure it is dry before reapplying another dressing.

## 2023-12-25 NOTE — ED Notes (Signed)
 In with Mani, PA-C for I&D
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
## 2024-02-13 ENCOUNTER — Other Ambulatory Visit (HOSPITAL_COMMUNITY)
Admission: RE | Admit: 2024-02-13 | Discharge: 2024-02-13 | Disposition: A | Discharge status: Home / Self Care | Source: Ambulatory Visit | Attending: Family Medicine | Admitting: Family Medicine

## 2024-02-13 ENCOUNTER — Ambulatory Visit: Admitting: Student

## 2024-02-13 ENCOUNTER — Encounter: Payer: Self-pay | Admitting: Student

## 2024-02-13 VITALS — BP 164/112 | HR 93 | Wt 209.0 lb

## 2024-02-13 DIAGNOSIS — Z1159 Encounter for screening for other viral diseases: Secondary | ICD-10-CM

## 2024-02-13 DIAGNOSIS — E119 Type 2 diabetes mellitus without complications: Secondary | ICD-10-CM | POA: Diagnosis not present

## 2024-02-13 DIAGNOSIS — Z113 Encounter for screening for infections with a predominantly sexual mode of transmission: Secondary | ICD-10-CM | POA: Insufficient documentation

## 2024-02-13 DIAGNOSIS — Z0000 Encounter for general adult medical examination without abnormal findings: Secondary | ICD-10-CM

## 2024-02-13 DIAGNOSIS — I10 Essential (primary) hypertension: Secondary | ICD-10-CM | POA: Diagnosis not present

## 2024-02-13 DIAGNOSIS — Z124 Encounter for screening for malignant neoplasm of cervix: Secondary | ICD-10-CM | POA: Insufficient documentation

## 2024-02-13 LAB — POCT GLYCOSYLATED HEMOGLOBIN (HGB A1C): HbA1c, POC (controlled diabetic range): 5 % (ref 0.0–7.0)

## 2024-02-13 NOTE — Assessment & Plan Note (Signed)
 A1c 5.0 today.  Type II lesions on pelvis, however does have A1c of 6.8, currently being treated with Ozempic . - Continue Ozempic  - UACR today

## 2024-02-13 NOTE — Patient Instructions (Signed)
 It was great to see you! Thank you for allowing me to participate in your care!   I recommend that you always bring your medications to each appointment as this makes it easy to ensure we are on the correct medications and helps us  not miss when refills are needed.  Our plans for today:  - Follow-up on Monday for ingrown hair and blood pressure - We are checking some labs today, I will call you if they are abnormal will send you a MyChart message or a letter if they are normal.  If you do not hear about your labs in the next 2 weeks please let us  know.  Take care and seek immediate care sooner if you develop any concerns. Please remember to show up 15 minutes before your scheduled appointment time!  Gladis Church, DO [facility]

## 2024-02-13 NOTE — Progress Notes (Cosign Needed Addendum)
    SUBJECTIVE:   Chief compliant/HPI: annual examination  Terri [name] is a 38 y.o. who presents today for an annual exam.   Updated history tabs and problem list. No acute concerns today. No c/f headache, chest pain, NVD.  Would like to schedule appointment to discuss ingrown hair on pubic area.  She is otherwise doing well.  She would also like STD testing today.  OBJECTIVE:   BP (!) 164/112   Pulse 93   Wt 209 lb (94.8 kg)   LMP 02/02/2024   SpO2 99%   BMI 31.78 kg/m    General: NAD, pleasant Cardio: RRR, no MRG. Cap Refill <2s. Respiratory: CTAB, normal wob on RA Skin: Warm and dry GU/breast exam: Normal breast tissue, no mass/adenopathy present.  Normal labia, vaginal vault, cervix, scant white discharge present. **Chaperone Tashira CMA present during encounter**  ASSESSMENT/PLAN:   Assessment & Plan Annual visit for general adult medical examination without abnormal findings Considered the following items based upon USPSTF recommendations: HIV testing: ordered Hepatitis C: ordered Hepatitis B: Not ordered Syphilis if at high risk: ordered GC/CT ordered Lipid panel (nonfasting or fasting) discussed based upon AHA recommendations and ordered.  Consider repeat every 4-6 years.  Reviewed risk factors for latent tuberculosis and not indicated Cervical cancer screening: due for Pap today, cytology + HPV ordered Immunizations: Declined flu MyChart Activation:Already signed up Essential hypertension Not at goal, will discuss at her appointment next week -BMP Type 2 diabetes mellitus without complication, without long-term current use of insulin (HCC) A1c 5.0 today.  Type II lesions on pelvis, however does have A1c of 6.8, currently being treated with Ozempic . - Continue Ozempic  - UACR today  Follow-up recommendations Follow-up next week for ingrown hair on pubic area Follow-up in 4 days for blood pressure control and to discuss diabetes  Gladis Church,  DO [facility]

## 2024-02-13 NOTE — Assessment & Plan Note (Signed)
 Not at goal, will discuss at her appointment next week -BMP

## 2024-02-14 ENCOUNTER — Ambulatory Visit: Payer: Self-pay | Admitting: Student

## 2024-02-14 LAB — BASIC METABOLIC PANEL WITH GFR
BUN/Creatinine Ratio: 8 — ABNORMAL LOW (ref 9–23)
BUN: 8 mg/dL (ref 6–20)
CO2: 21 mmol/L (ref 20–29)
Calcium: 9.2 mg/dL (ref 8.7–10.2)
Chloride: 103 mmol/L (ref 96–106)
Creatinine, Ser: 0.95 mg/dL (ref 0.57–1.00)
Glucose: 80 mg/dL (ref 70–99)
Potassium: 4.2 mmol/L (ref 3.5–5.2)
Sodium: 139 mmol/L (ref 134–144)
eGFR: 79 mL/min/1.73 (ref >59)

## 2024-02-14 LAB — HEPATITIS C ANTIBODY: Hep C Virus Ab: NONREACTIVE

## 2024-02-14 LAB — MICROALBUMIN / CREATININE URINE RATIO
Creatinine, Urine: 248.1 mg/dL
Microalb/Creat Ratio: 11 mg/g{creat} (ref 0–29)
Microalbumin, Urine: 28.2 ug/mL

## 2024-02-14 LAB — RPR: RPR Ser Ql: NONREACTIVE

## 2024-02-14 LAB — HIV ANTIBODY (ROUTINE TESTING W REFLEX): HIV Screen 4th Generation wRfx: NONREACTIVE

## 2024-02-14 LAB — LIPID PANEL
Chol/HDL Ratio: 3.1 ratio (ref 0.0–4.4)
Cholesterol, Total: 181 mg/dL (ref 100–199)
HDL: 58 mg/dL (ref >39)
LDL Chol Calc (NIH): 114 mg/dL — ABNORMAL HIGH (ref 0–99)
Triglycerides: 47 mg/dL (ref 0–149)
VLDL Cholesterol Cal: 9 mg/dL (ref 5–40)

## 2024-02-18 ENCOUNTER — Encounter: Payer: Self-pay | Admitting: Student

## 2024-02-18 ENCOUNTER — Ambulatory Visit: Payer: Self-pay | Admitting: Student

## 2024-02-18 VITALS — BP 112/81 | HR 96 | Ht 68.0 in | Wt 208.0 lb

## 2024-02-18 DIAGNOSIS — L732 Hidradenitis suppurativa: Secondary | ICD-10-CM

## 2024-02-18 DIAGNOSIS — L729 Follicular cyst of the skin and subcutaneous tissue, unspecified: Secondary | ICD-10-CM | POA: Diagnosis not present

## 2024-02-18 DIAGNOSIS — R87610 Atypical squamous cells of undetermined significance on cytologic smear of cervix (ASC-US): Secondary | ICD-10-CM | POA: Diagnosis not present

## 2024-02-18 DIAGNOSIS — I10 Essential (primary) hypertension: Secondary | ICD-10-CM

## 2024-02-18 LAB — CYTOLOGY - PAP
Chlamydia: NEGATIVE
Comment: NEGATIVE
Comment: NEGATIVE
Comment: NEGATIVE
Comment: NORMAL
Diagnosis: UNDETERMINED — AB
High risk HPV: NEGATIVE
Neisseria Gonorrhea: NEGATIVE
Trichomonas: NEGATIVE

## 2024-02-18 MED ORDER — DOXYCYCLINE HYCLATE 100 MG PO TABS: 100.0000 mg | ORAL_TABLET | Freq: Two times a day (BID) | ORAL | 0 refills | Status: DC

## 2024-02-18 NOTE — Assessment & Plan Note (Signed)
 Well controlled. - Continue Exforge 

## 2024-02-18 NOTE — Patient Instructions (Signed)
 It was great to see you! Thank you for allowing me to participate in your care!   I recommend that you always bring your medications to each appointment as this makes it easy to ensure we are on the correct medications and helps us  not miss when refills are needed.  Our plans for today:  - Take Doxycycline  100 mg twice daily for 14 days - Follow-up if cyst fails to improve - I will send a referral to GYN  - I will send a referral to Dermatology  Take care and seek immediate care sooner if you develop any concerns. Please remember to show up 15 minutes before your scheduled appointment time!  Gladis Church, DO [facility]

## 2024-02-18 NOTE — Progress Notes (Signed)
    SUBJECTIVE:   CHIEF COMPLAINT / HPI:   Hypertension Follows up for hypertension.  Blood pressure well-controlled.  Adherent to Exforge .  Cyst of mons pubis Seen by urgent care on 12/23/2023 had incision and drainage of large abscess.  Incorrectly labeled as labial abscess on chart review.  Area still tender, nodule still present, sinus tract with intermittent drainage of pus.  No systemic symptoms.  Hidradenitis suppurativa Patient reports history of  boils in armpits, and thighs.  These are separate from her current cyst of her mons pubis.  She is requesting referral to dermatology.  Discussed that we could also treated for at bedtime, but she would like to see specialist as well.  OBJECTIVE:   BP 112/81   Pulse 96   Ht 5' 8 (1.727 m)   Wt 208 lb (94.3 kg)   LMP 02/02/2024   SpO2 100%   BMI 31.63 kg/m    General: NAD, pleasant Cardio: RRR, no MRG.  Respiratory: CTAB, normal wob on RA Skin: Warm and dry.  Firm, mobile nodule approximately 3 x 3 cm, sinus tract with pus located over the mons pubis.   ASSESSMENT/PLAN:   Assessment & Plan Cyst of skin Draining cyst.  Do not see indication for repeat I&D today. - Trial doxycycline  100 mg twice daily for 14 days (history of HS, see below) Hidradenitis suppurativa Doxycycline  as above.  Consider extended course. - Referral to dermatology Essential hypertension Well controlled. - Continue Exforge  ASCUS of cervix with negative high risk HPV -Repeat Pap in 1 year   Gladis Church, DO [facility]

## 2024-02-21 ENCOUNTER — Encounter (HOSPITAL_BASED_OUTPATIENT_CLINIC_OR_DEPARTMENT_OTHER): Admitting: Family

## 2024-02-28 ENCOUNTER — Ambulatory Visit (HOSPITAL_BASED_OUTPATIENT_CLINIC_OR_DEPARTMENT_OTHER): Admitting: Family

## 2024-02-28 ENCOUNTER — Encounter (HOSPITAL_BASED_OUTPATIENT_CLINIC_OR_DEPARTMENT_OTHER): Payer: Self-pay | Admitting: Family

## 2024-02-28 ENCOUNTER — Other Ambulatory Visit: Payer: Self-pay

## 2024-02-28 ENCOUNTER — Other Ambulatory Visit (HOSPITAL_BASED_OUTPATIENT_CLINIC_OR_DEPARTMENT_OTHER): Payer: Self-pay

## 2024-02-28 VITALS — BP 122/80 | HR 94 | Resp 17 | Ht 68.0 in | Wt 216.0 lb

## 2024-02-28 DIAGNOSIS — G4733 Obstructive sleep apnea (adult) (pediatric): Secondary | ICD-10-CM

## 2024-02-28 DIAGNOSIS — I4510 Unspecified right bundle-branch block: Secondary | ICD-10-CM | POA: Diagnosis not present

## 2024-02-28 DIAGNOSIS — E119 Type 2 diabetes mellitus without complications: Secondary | ICD-10-CM

## 2024-02-28 DIAGNOSIS — I10 Essential (primary) hypertension: Secondary | ICD-10-CM | POA: Diagnosis not present

## 2024-02-28 MED ORDER — AMLODIPINE BESYLATE-VALSARTAN 10-320 MG PO TABS: 1.0000 | ORAL_TABLET | Freq: Every day | ORAL | 3 refills | Status: AC

## 2024-02-28 MED ORDER — OZEMPIC (2 MG/DOSE) 8 MG/3ML ~~LOC~~ SOPN
2.0000 mg | PEN_INJECTOR | SUBCUTANEOUS | 5 refills | Status: AC
  Filled 2024-02-28 – 2024-03-26 (×2): qty 3, 28d supply, fill #0
  Filled 2024-04-28: qty 3, 28d supply, fill #1
  Filled 2024-06-09: qty 3, 28d supply, fill #2

## 2024-02-28 NOTE — Patient Instructions (Addendum)
 Medication Instructions:  Continue your current medications   Labwork: In 4 months for fasting lipid panel   Testing/Procedures: Your EKG today looked good!   Follow-Up: Please follow up in 6 months in [facility] with Dr. Raford, Reche Finder, NP or Allean Mink PharmD    Special Instructions:    To prevent palpitations: Make sure you are adequately hydrated.  Avoid and/or limit caffeine  containing beverages like soda or tea. Exercise regularly.  Manage stress well. Some over the counter medications can cause palpitations such as Benadryl , AdvilPM, TylenolPM. Regular Advil  or Tylenol  do not cause palpitations.       Supraventricular Tachycardia, Adult  Supraventricular tachycardia (SVT) is a kind of abnormal heartbeat. It makes your heart beat very fast. This may last for just a short time. Or it may last longer and need treatment to get the heartbeat to go back to normal. A normal resting heartbeat is 60-100 times a minute. SVT can make your heart beat more than 150 times a minute.  The times when you have a fast heartbeat--or episodes of SVT--can be scary. But they're usually not dangerous. In some cases, SVT can lead to other heart problems. What are the causes?  SVT happens when electrical signals are sent out from areas of the upper part of the heart that don't normally send heartbeat signals. What increases the risk? You are more likely to get SVT if you are: Middle aged or older. Female. Other things that may increase your risk include: Having stress or feeling worried or nervous. Using illegal drugs such as cocaine or methamphetamine. Using over-the-counter cough or cold medicines. Stimulant drugs, such as caffeine . Smoking or alcohol use. Having any of these conditions: A thyroid  condition. Diabetes. Obstructive sleep apnea. What are the signs or symptoms? A pounding heartbeat. Feeling fast or irregular heartbeats called palpitations. Shortness of  breath. Weakness and tiredness. Tightness or pain in your chest. Feeling light-headed or dizzy. Feeling worried or nervous. Sometimes there are no symptoms.  Follow these instructions at home: Stress Avoid things that make you feel stressed. Find healthy ways to deal with stress, such as: Doing yoga or meditation. Going out in nature. Listening to relaxing music. Taking steps to be healthy, such as getting lots of sleep, exercising, and eating a balanced diet. Talking with a mental health provider. Lifestyle Try to get at least 7 hours of sleep each night. Do not smoke, vape, or use products with nicotine or tobacco in them. If you need help quitting, talk with your provider. Do not use illegal drugs. If you need help quitting, talk with your provider. Do not drink alcohol if it gives you a fast heartbeat. If alcohol does not seem to give you a fast heartbeat, limit your alcohol use. If you drink alcohol: Limit how much you have to: 0-1 drink a day if you are female. 0-2 drinks a day if you are female. Know how much alcohol is in your drink. In the [place] one drink is one 12 oz bottle of beer (355 mL), one 5 oz glass of wine (148 mL), or one 1 oz glass of hard liquor (44 mL). Be aware of how caffeine  affects you. If caffeine  gives you a fast heartbeat, do not eat, drink, or use anything with caffeine  in it. If caffeine  doesn't seem to give you a fast heartbeat, limit how much caffeine  you have.  General instructions Stay at a healthy weight. Exercise often. Ask your provider about good activities for you.  Try one or a mixture of these: 150 minutes a week of gentle exercise, like walking or yoga. 75 minutes a week of exercise that is very active, like running or swimming. Do vagal nerve stimulation only as told by your provider. Take medicines only as told by your provider. Keep all follow-up visits. Your provider will want to make sure your treatments are working.
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
## 2024-03-10 ENCOUNTER — Other Ambulatory Visit (HOSPITAL_BASED_OUTPATIENT_CLINIC_OR_DEPARTMENT_OTHER): Payer: Self-pay

## 2024-03-11 ENCOUNTER — Telehealth: Payer: Self-pay

## 2024-03-11 NOTE — Telephone Encounter (Signed)
 Terri [name] is interested in the [employer] and is interested in joining the October 21st class at [facility] but needs to verify schedule. I will call her back next week.

## 2024-03-20 ENCOUNTER — Telehealth: Payer: Self-pay

## 2024-03-20 NOTE — Telephone Encounter (Signed)
 Called about upcoming PREP class at [facility] starting on October 21. She will return my call.

## 2024-03-26 ENCOUNTER — Other Ambulatory Visit (HOSPITAL_BASED_OUTPATIENT_CLINIC_OR_DEPARTMENT_OTHER): Payer: Self-pay

## 2024-04-07 ENCOUNTER — Encounter (HOSPITAL_BASED_OUTPATIENT_CLINIC_OR_DEPARTMENT_OTHER): Payer: Self-pay | Admitting: *Deleted

## 2024-04-07 ENCOUNTER — Emergency Department (HOSPITAL_BASED_OUTPATIENT_CLINIC_OR_DEPARTMENT_OTHER)
Admission: EM | Admit: 2024-04-07 | Discharge: 2024-04-07 | Disposition: A | Discharge status: Home / Self Care | Attending: Emergency Medicine | Admitting: Emergency Medicine

## 2024-04-07 ENCOUNTER — Other Ambulatory Visit: Payer: Self-pay

## 2024-04-07 ENCOUNTER — Emergency Department (HOSPITAL_BASED_OUTPATIENT_CLINIC_OR_DEPARTMENT_OTHER): Admitting: Radiology

## 2024-04-07 DIAGNOSIS — M25511 Pain in right shoulder: Secondary | ICD-10-CM | POA: Diagnosis not present

## 2024-04-07 DIAGNOSIS — I10 Essential (primary) hypertension: Secondary | ICD-10-CM | POA: Diagnosis not present

## 2024-04-07 DIAGNOSIS — Z79899 Other long term (current) drug therapy: Secondary | ICD-10-CM | POA: Insufficient documentation

## 2024-04-07 MED ORDER — TRAMADOL HCL 50 MG PO TABS
50.0000 mg | ORAL_TABLET | Freq: Once | ORAL | Status: AC
  Administered 2024-04-07: 50 mg via ORAL
  Filled 2024-04-07: qty 1

## 2024-04-07 MED ORDER — TRAMADOL HCL 50 MG PO TABS: 50.0000 mg | ORAL_TABLET | Freq: Four times a day (QID) | ORAL | 0 refills | Status: AC | PRN

## 2024-04-07 MED ORDER — NAPROXEN 500 MG PO TABS: 500.0000 mg | ORAL_TABLET | Freq: Two times a day (BID) | ORAL | 0 refills | Status: AC

## 2024-04-07 NOTE — ED Provider Notes (Signed)
 [place] EMERGENCY DEPARTMENT AT [facility] Provider Note   CSN: [id number] Arrival date & time: 04/07/24  2114     Patient presents with: Shoulder Injury   Terri [name] is a 38 y.o. female.   Patient is a 38 year old female with history of hypertension.  Patient presenting today with complaints of right shoulder pain.  She woke up 2 mornings ago with discomfort in her right shoulder that began in the absence of any injury or trauma.  She does report consuming alcohol the evening before, but denies any injury.  She denies any weakness or numbness of her hand.  She feels as though it pops and had a friend attempt to manipulate the shoulder as she felt it might be dislocated.       Prior to Admission medications   Medication Sig Start Date End Date Taking? Authorizing Provider  amLODipine -valsartan  (EXFORGE ) 10-320 MG tablet Take 1 tablet by mouth daily. 02/28/24   Vannie Reche RAMAN, NP  Blood Pressure KIT Use to check blood pressure as instructed by your physician 07/20/22   Perri DELENA Meliton Mickey., MD  Semaglutide , 2 MG/DOSE, (OZEMPIC , 2 MG/DOSE,) 8 MG/3ML SOPN Inject 2 mg into the skin once a week. 02/28/24   Walker, Caitlin S, NP    Allergies: Patient has no known allergies.    Review of Systems  All other systems reviewed and are negative.   Updated Vital Signs BP (!) 138/100   Pulse 94   Temp 98.1 F (36.7 C) (Oral)   Resp 16   LMP 03/31/2024   SpO2 100%   Physical Exam Vitals and nursing note reviewed.  Constitutional:      Appearance: Normal appearance.  HENT:     Head: Normocephalic.  Pulmonary:     Effort: Pulmonary effort is normal.  Musculoskeletal:     Comments: The right shoulder is grossly normal in appearance.  She has pain with any range of motion.  Ulnar and radial pulses are easily palpable and motor and sensation are intact throughout the entire hand.  Skin:    General: Skin is warm and dry.  Neurological:     Mental Status: She is  alert and oriented to person, place, and time.     (all labs ordered are listed, but only abnormal results are displayed) Labs Reviewed - No data to display  EKG: None  Radiology: DG Shoulder Right Result Date: 04/07/2024 CLINICAL DATA:  Shoulder pain EXAM: RIGHT SHOULDER - 2+ VIEW COMPARISON:  None Available. FINDINGS: There is no evidence of fracture or dislocation. There is no evidence of arthropathy or other focal bone abnormality. Soft tissues are unremarkable. IMPRESSION: Negative. Electronically Signed   By: Luke Bun M.D.   On: 04/07/2024 21:50     Procedures   Medications Ordered in the ED  traMADol (ULTRAM) tablet 50 mg (has no administration in time range)                                    Medical Decision Making Amount and/or Complexity of Data Reviewed Radiology: ordered.  Risk Prescription drug management.   Patient presenting with right shoulder pain that I suspect is soft tissue in nature.  She has some tenderness over the lateral deltoid, but no deformity or obvious abnormality on exam.  X-rays show normal alignment with no evidence for fracture or dislocation.  Patient to be treated with an arm sling, NSAIDs,  tramadol, and follow-up as needed if symptoms are not improving.     Final diagnoses:  None    ED Discharge Orders     None          Geroldine Berg, MD 04/07/24 2328
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
## 2024-04-07 NOTE — Discharge Instructions (Signed)
 Wear arm sling for comfort and support.  Begin taking naproxen  as prescribed.  Begin taking tramadol as prescribed as needed for pain.  Rest your shoulder for the next several days, then gradually reintroduce activity as tolerated.  Follow-up with primary doctor if symptoms or not improving in the next week.

## 2024-04-28 ENCOUNTER — Other Ambulatory Visit (HOSPITAL_BASED_OUTPATIENT_CLINIC_OR_DEPARTMENT_OTHER): Payer: Self-pay

## 2024-04-29 ENCOUNTER — Ambulatory Visit: Admitting: Student

## 2024-04-30 ENCOUNTER — Other Ambulatory Visit (HOSPITAL_BASED_OUTPATIENT_CLINIC_OR_DEPARTMENT_OTHER): Payer: Self-pay

## 2024-05-15 ENCOUNTER — Other Ambulatory Visit (HOSPITAL_BASED_OUTPATIENT_CLINIC_OR_DEPARTMENT_OTHER): Payer: Self-pay | Admitting: Cardiovascular Disease

## 2024-05-15 ENCOUNTER — Telehealth: Payer: Self-pay | Admitting: Family

## 2024-05-15 ENCOUNTER — Other Ambulatory Visit (HOSPITAL_BASED_OUTPATIENT_CLINIC_OR_DEPARTMENT_OTHER): Payer: Self-pay

## 2024-05-15 DIAGNOSIS — I10 Essential (primary) hypertension: Secondary | ICD-10-CM

## 2024-05-15 NOTE — Telephone Encounter (Signed)
*  STAT* If patient is at the pharmacy, call can be transferred to refill team.   1. Which medications need to be refilled? (please list name of each medication and dose if known)   amLODipine -valsartan  (EXFORGE ) 10-320 MG tablet   NEW PHARMACY   4. Which pharmacy/location (including street and city if local pharmacy) is medication to be sent to?  [id number] [place], [place] - [address] AT [facility] [address] Phone: [phone]  Fax: [phone]       5. Do they need a 30 day or 90 day supply? 90

## 2024-05-16 NOTE — Telephone Encounter (Signed)
 Pt has been made aware that [facility] sent in a year supply of her Amlodipine -Valsartan  to [facility] on [address].  Gave pt # to contact them.  She was appreciative for the call.

## 2024-05-29 ENCOUNTER — Ambulatory Visit
Admission: EM | Admit: 2024-05-29 | Discharge: 2024-05-29 | Disposition: A | Discharge status: Home / Self Care | Source: Home / Self Care

## 2024-05-29 NOTE — ED Triage Notes (Signed)
 Pt present with c/o N/V/D x 5 days. Pt states she has a headache. Home interventions: Ibuprofen   Denies cold symptoms

## 2024-05-30 ENCOUNTER — Emergency Department (HOSPITAL_BASED_OUTPATIENT_CLINIC_OR_DEPARTMENT_OTHER)
Admission: EM | Admit: 2024-05-30 | Discharge: 2024-05-30 | Disposition: A | Discharge status: Home / Self Care | Attending: Emergency Medicine | Admitting: Emergency Medicine

## 2024-05-30 ENCOUNTER — Other Ambulatory Visit (HOSPITAL_BASED_OUTPATIENT_CLINIC_OR_DEPARTMENT_OTHER): Payer: Self-pay

## 2024-05-30 ENCOUNTER — Encounter (HOSPITAL_BASED_OUTPATIENT_CLINIC_OR_DEPARTMENT_OTHER): Payer: Self-pay

## 2024-05-30 ENCOUNTER — Other Ambulatory Visit: Payer: Self-pay

## 2024-05-30 DIAGNOSIS — R112 Nausea with vomiting, unspecified: Secondary | ICD-10-CM | POA: Diagnosis not present

## 2024-05-30 DIAGNOSIS — R197 Diarrhea, unspecified: Secondary | ICD-10-CM | POA: Insufficient documentation

## 2024-05-30 LAB — COMPREHENSIVE METABOLIC PANEL WITH GFR
ALT: 7 U/L (ref 0–44)
AST: 14 U/L — ABNORMAL LOW (ref 15–41)
Albumin: 4.3 g/dL (ref 3.5–5.0)
Alkaline Phosphatase: 80 U/L (ref 38–126)
Anion gap: 10 (ref 5–15)
BUN: 11 mg/dL (ref 6–20)
CO2: 26 mmol/L (ref 22–32)
Calcium: 9.5 mg/dL (ref 8.9–10.3)
Chloride: 102 mmol/L (ref 98–111)
Creatinine, Ser: 1.09 mg/dL — ABNORMAL HIGH (ref 0.44–1.00)
GFR, Estimated: >60 mL/min
Glucose, Bld: 80 mg/dL (ref 70–99)
Potassium: 4.1 mmol/L (ref 3.5–5.1)
Sodium: 138 mmol/L (ref 135–145)
Total Bilirubin: 0.4 mg/dL (ref 0.0–1.2)
Total Protein: 7.5 g/dL (ref 6.5–8.1)

## 2024-05-30 LAB — URINALYSIS, ROUTINE W REFLEX MICROSCOPIC
Bilirubin Urine: NEGATIVE
Glucose, UA: NEGATIVE mg/dL
Hgb urine dipstick: NEGATIVE
Ketones, ur: NEGATIVE mg/dL
Nitrite: NEGATIVE
Protein, ur: 100 mg/dL — AB
Specific Gravity, Urine: 1.025 (ref 1.005–1.030)
pH: 6.5 (ref 5.0–8.0)

## 2024-05-30 LAB — PREGNANCY, URINE: Preg Test, Ur: NEGATIVE

## 2024-05-30 LAB — CBC
HCT: 37.7 % (ref 36.0–46.0)
Hemoglobin: 12.9 g/dL (ref 12.0–15.0)
MCH: 31.7 pg (ref 26.0–34.0)
MCHC: 34.2 g/dL (ref 30.0–36.0)
MCV: 92.6 fL (ref 80.0–100.0)
Platelets: 278 K/uL (ref 150–400)
RBC: 4.07 MIL/uL (ref 3.87–5.11)
RDW: 12.3 % (ref 11.5–15.5)
WBC: 4.3 K/uL (ref 4.0–10.5)
nRBC: 0 % (ref 0.0–0.2)

## 2024-05-30 LAB — LIPASE, BLOOD: Lipase: 25 U/L (ref 11–51)

## 2024-05-30 MED ORDER — ONDANSETRON 4 MG PO TBDP
4.0000 mg | ORAL_TABLET | ORAL | 0 refills | Status: AC | PRN
  Filled 2024-05-30: qty 20, 4d supply, fill #0

## 2024-05-30 MED ORDER — ONDANSETRON 4 MG PO TBDP
4.0000 mg | ORAL_TABLET | Freq: Once | ORAL | Status: AC
  Administered 2024-05-30: 4 mg via ORAL
  Filled 2024-05-30: qty 1

## 2024-05-30 MED ORDER — DICYCLOMINE HCL 10 MG PO CAPS
10.0000 mg | ORAL_CAPSULE | Freq: Once | ORAL | Status: AC
  Administered 2024-05-30: 10 mg via ORAL
  Filled 2024-05-30: qty 1

## 2024-05-30 MED ORDER — DICYCLOMINE HCL 20 MG PO TABS
20.0000 mg | ORAL_TABLET | Freq: Two times a day (BID) | ORAL | 0 refills | Status: AC
  Filled 2024-05-30: qty 20, 10d supply, fill #0

## 2024-05-30 MED ORDER — LOPERAMIDE HCL 2 MG PO CAPS
2.0000 mg | ORAL_CAPSULE | Freq: Once | ORAL | Status: AC
  Administered 2024-05-30: 2 mg via ORAL
  Filled 2024-05-30: qty 1

## 2024-05-30 NOTE — ED Provider Notes (Signed)
 " [place] EMERGENCY DEPARTMENT AT [facility] Provider Note   CSN: [id number] Arrival date & time: 05/30/24  1209     Patient presents with: Abdominal Pain and Emesis   Terri [name] is a 38 y.o. female who presents emergency department with a chief complaint of nausea vomiting and diarrhea.  Patient reports that she ate seafood Saturday night and since that time has had nausea vomiting and diarrhea.  She had 2 days where seem to slow down a little bit and then returned.  She is concerned that maybe she got food poisoning because she was with multiple people and she was the only one that had seafood.  She has some intermittent abdominal cramping.  She denies any fever or chills.  She has not tried any medications to treat her symptoms at home.    Abdominal Pain Associated symptoms: vomiting   Emesis Associated symptoms: abdominal pain        Prior to Admission medications  Medication Sig Start Date End Date Taking? Authorizing Provider  dicyclomine  (BENTYL ) 20 MG tablet Take 1 tablet (20 mg total) by mouth 2 (two) times daily. 05/30/24  Yes Lyell Clugston, PA-C  ondansetron  (ZOFRAN -ODT) 4 MG disintegrating tablet Take 1 tablet (4 mg total) by mouth every 4 (four) hours as needed nause/vomiting. 05/30/24  Yes Zyren Sevigny, PA-C  amLODipine -valsartan  (EXFORGE ) 10-320 MG tablet Take 1 tablet by mouth daily. 02/28/24   Vannie Reche RAMAN, NP  Blood Pressure KIT Use to check blood pressure as instructed by your physician 07/20/22   Perri DELENA Meliton Mickey., MD  naproxen  (NAPROSYN ) 500 MG tablet Take 1 tablet (500 mg total) by mouth 2 (two) times daily. 04/07/24   Geroldine Berg, MD  Semaglutide , 2 MG/DOSE, (OZEMPIC , 2 MG/DOSE,) 8 MG/3ML SOPN Inject 2 mg into the skin once a week. 02/28/24   Vannie Reche RAMAN, NP  traMADol  (ULTRAM ) 50 MG tablet Take 1 tablet (50 mg total) by mouth every 6 (six) hours as needed. 04/07/24   Geroldine Berg, MD    Allergies: Patient has no known  allergies.    Review of Systems  Gastrointestinal:  Positive for abdominal pain and vomiting.    Updated Vital Signs BP (!) 133/106   Pulse 83   Temp 98.3 F (36.8 C)   Resp 20   Ht 5' 8 (1.727 m)   Wt 86.2 kg   LMP 05/27/2024   SpO2 98%   BMI 28.89 kg/m   Physical Exam Vitals and nursing note reviewed.  Constitutional:      General: She is not in acute distress.    Appearance: She is well-developed. She is not diaphoretic.  HENT:     Head: Normocephalic and atraumatic.     Right Ear: External ear normal.     Left Ear: External ear normal.     Nose: Nose normal.     Mouth/Throat:     Mouth: Mucous membranes are moist.  Eyes:     General: No scleral icterus.    Conjunctiva/sclera: Conjunctivae normal.  Cardiovascular:     Rate and Rhythm: Normal rate and regular rhythm.     Heart sounds: Normal heart sounds. No murmur heard.    No friction rub. No gallop.  Pulmonary:     Effort: Pulmonary effort is normal. No respiratory distress.     Breath sounds: Normal breath sounds.  Abdominal:     General: Bowel sounds are increased. There is no distension.     Palpations: Abdomen is soft.  There is no mass.     Tenderness: There is no abdominal tenderness. There is no guarding.     Hernia: No hernia is present.  Musculoskeletal:     Cervical back: Normal range of motion.  Skin:    General: Skin is warm and dry.  Neurological:     Mental Status: She is alert and oriented to person, place, and time.  Psychiatric:        Behavior: Behavior normal.     (all labs ordered are listed, but only abnormal results are displayed) Labs Reviewed  COMPREHENSIVE METABOLIC PANEL WITH GFR - Abnormal; Notable for the following components:      Result Value   Creatinine, Ser 1.09 (*)    AST 14 (*)    All other components within normal limits  URINALYSIS, ROUTINE W REFLEX MICROSCOPIC - Abnormal; Notable for the following components:   APPearance HAZY (*)    Protein, ur 100 (*)     Leukocytes,Ua MODERATE (*)    Bacteria, UA RARE (*)    All other components within normal limits  LIPASE, BLOOD  CBC  PREGNANCY, URINE    EKG: None  Radiology: No results found.   Procedures   Medications Ordered in the ED  dicyclomine  (BENTYL ) capsule 10 mg (has no administration in time range)  ondansetron  (ZOFRAN -ODT) disintegrating tablet 4 mg (has no administration in time range)  loperamide  (IMODIUM ) capsule 2 mg (has no administration in time range)    Clinical Course as of 05/30/24 1514  Fri May 30, 2024  1459 Urinalysis, Routine w reflex microscopic -Urine, Clean Catch(!) [AH]  1459 CBC [AH]  1459 Comprehensive metabolic panel(!) [AH]    Clinical Course User Index [AH] Arloa Chroman, PA-C                                 Medical Decision Making Amount and/or Complexity of Data Reviewed Labs: ordered. Decision-making details documented in ED Course.  Risk Prescription drug management.   Patient here with nausea vomiting and diarrhea. The differential diagnosis of diarrhea includes but is not limited to Viral- norovirus/rotavirus; Bacterial-Campylobacter,Shigella, Salmonella, Escherichia coli, E. coli 0157:H7, Yersinia enterocolitica, Vibrio cholerae, Clostridium difficile. Parasitic- Giardia lamblia, Cryptosporidium,Entamoeba histolytica,Cyclospora, Microsporidium. Toxin- Staphylococcus aureus, Bacillus cereus. Noninfectious causes include GI Bleed, Appendicitis, Mesenteric Ischemia, Diverticulitis, Adrenal Crisis, Thyroid  Storm, Toxicologic exposures, Antibiotic or drug-associated, inflammatory bowel disease. I offered the patient stool testing however she reports that she is all stooled out.  She feels like she is unable to offer a stool sample to test for pathogen. At this time she would like symptomatic treatment.   I reviewed the patient's labs Urine appears contaminated she is not having urinary symptoms.  Creatinine is slightly elevated beyond her  baseline but does not represent AKI.  She likely has a little bit of dehydration. Patient vital signs are otherwise fairly unremarkable.  She has no significant tachycardia or hypotension suggestive of massive dehydration.  Patient will be discharged with Imodium , Zofran  and Bentyl .  She was given a work note.  Appears appropriate for discharge at this time.    Final diagnoses:  Nausea vomiting and diarrhea    ED Discharge Orders          Ordered    ondansetron  (ZOFRAN -ODT) 4 MG disintegrating tablet  Every 4 hours PRN        05/30/24 1510    dicyclomine  (BENTYL ) 20 MG tablet  2 times  daily        05/30/24 1510               Arloa Chroman, PA-C 05/30/24 1518  "

## 2024-05-30 NOTE — Discharge Instructions (Signed)
 USe over the counter imodium as directed on the box for relief of you diarrhea General instructions Wash your hands often, especially after having diarrhea or vomiting. If soap and water are not available, use hand sanitizer. Make sure that all people in your household wash their hands well and often. Take over-the-counter and prescription medicines only as told by your health care provider. Rest at home while you recover. Watch your condition for any changes. Take a warm bath to relieve any burning or pain from frequent diarrhea episodes. Keep all follow-up visits. This is important. Contact a health care provider if you: Cannot keep fluids down. Have symptoms that get worse. Have new symptoms. Feel light-headed or dizzy. Have muscle cramps. Get help right away if you: Have chest pain. Have trouble breathing or you are breathing very quickly. Have a fast heartbeat. Feel extremely weak or you faint. Have a severe headache, a stiff neck, or both. Have a rash. Have severe pain, cramping, or bloating in your abdomen. Have skin that feels cold and clammy. Feel confused. Have pain when you urinate. Have signs of dehydration, such as: Dark urine, very little urine, or no urine. Cracked lips. Dry mouth. Sunken eyes. Sleepiness. Weakness. Have signs of bleeding, such as: Seeing blood in your vomit. Having vomit that looks like coffee grounds. Having bloody or black stools or stools that look like tar. These symptoms may be an emergency. Get help right away. Call 911. Do not wait to see if the symptoms will go away. Do not drive yourself to the hospital.

## 2024-05-30 NOTE — ED Notes (Signed)
 Reviewed discharge instructions, medications, and home care with pt. Pt verbalized understanding and had no further questions. Pt exited ED without complications.

## 2024-05-30 NOTE — ED Notes (Signed)
 Patient stated she does not want to get undressed When asked to change into gown. Primary RN & Charge RN made aware

## 2024-05-30 NOTE — ED Triage Notes (Signed)
 Arrives ambulatory to the ED with complaints of nausea/vomiting & diarrhea x5 days. Patient is concerned that she may have food poisoning since the symptoms started after eating seafood.

## 2024-06-09 ENCOUNTER — Other Ambulatory Visit (HOSPITAL_BASED_OUTPATIENT_CLINIC_OR_DEPARTMENT_OTHER): Payer: Self-pay

## 2024-06-26 ENCOUNTER — Telehealth: Payer: Self-pay | Admitting: *Deleted

## 2024-06-26 DIAGNOSIS — Z7251 High risk heterosexual behavior: Secondary | ICD-10-CM

## 2024-06-26 NOTE — Telephone Encounter (Signed)
 Pt requesting rx for plan b  to be sent in to [facility] on [address]. Please advise. Crysta Gulick [place], CMA

## 2024-06-27 MED ORDER — LEVONORGESTREL 1.5 MG PO TABS: 1.5000 mg | ORAL_TABLET | Freq: Once | ORAL | 0 refills | Status: AC

## 2024-09-25 ENCOUNTER — Ambulatory Visit: Admitting: Dermatology
